# Patient Record
Sex: Male | Born: 1960 | Race: White | Hispanic: No | Marital: Married | Smoking: Current every day smoker
Health system: Southern US, Community
[De-identification: ages and names within clinical notes are randomized; demographics above are authoritative.]

## PROBLEM LIST (undated history)

## (undated) DIAGNOSIS — G4730 Sleep apnea, unspecified: Secondary | ICD-10-CM

## (undated) DIAGNOSIS — D751 Secondary polycythemia: Secondary | ICD-10-CM

## (undated) DIAGNOSIS — I10 Essential (primary) hypertension: Secondary | ICD-10-CM

## (undated) DIAGNOSIS — Z86010 Personal history of colonic polyps: Secondary | ICD-10-CM

## (undated) DIAGNOSIS — E785 Hyperlipidemia, unspecified: Secondary | ICD-10-CM

## (undated) DIAGNOSIS — K219 Gastro-esophageal reflux disease without esophagitis: Secondary | ICD-10-CM

## (undated) HISTORY — DX: Gastro-esophageal reflux disease without esophagitis: K21.9

## (undated) HISTORY — PX: APPENDECTOMY: SHX54

## (undated) HISTORY — DX: Hyperlipidemia, unspecified: E78.5

## (undated) HISTORY — DX: Personal history of colonic polyps: Z86.010

## (undated) HISTORY — DX: Essential (primary) hypertension: I10

## (undated) HISTORY — DX: Sleep apnea, unspecified: G47.30

## (undated) HISTORY — PX: WISDOM TOOTH EXTRACTION: SHX21

## (undated) HISTORY — DX: Secondary polycythemia: D75.1

---

## 1997-05-10 ENCOUNTER — Encounter: Payer: Self-pay | Admitting: Pulmonary Disease

## 2001-06-24 HISTORY — PX: ESOPHAGOGASTRODUODENOSCOPY: SHX1529

## 2004-05-10 ENCOUNTER — Ambulatory Visit: Payer: Self-pay | Admitting: Internal Medicine

## 2004-05-15 ENCOUNTER — Ambulatory Visit: Payer: Self-pay | Admitting: Internal Medicine

## 2004-08-15 ENCOUNTER — Emergency Department (HOSPITAL_COMMUNITY): Admission: EM | Admit: 2004-08-15 | Discharge: 2004-08-16 | Payer: Self-pay | Admitting: Emergency Medicine

## 2005-02-18 ENCOUNTER — Ambulatory Visit: Payer: Self-pay | Admitting: Internal Medicine

## 2005-02-27 ENCOUNTER — Ambulatory Visit: Payer: Self-pay | Admitting: Internal Medicine

## 2005-03-11 ENCOUNTER — Encounter: Admission: RE | Admit: 2005-03-11 | Discharge: 2005-06-09 | Payer: Self-pay | Admitting: Internal Medicine

## 2005-10-04 ENCOUNTER — Observation Stay (HOSPITAL_COMMUNITY): Admission: EM | Admit: 2005-10-04 | Discharge: 2005-10-04 | Payer: Self-pay | Admitting: Emergency Medicine

## 2005-10-04 ENCOUNTER — Ambulatory Visit: Payer: Self-pay | Admitting: *Deleted

## 2005-10-17 ENCOUNTER — Ambulatory Visit: Payer: Self-pay

## 2005-10-21 ENCOUNTER — Ambulatory Visit: Payer: Self-pay | Admitting: Cardiovascular Disease

## 2005-11-13 ENCOUNTER — Ambulatory Visit: Payer: Self-pay | Admitting: Internal Medicine

## 2006-12-03 ENCOUNTER — Telehealth: Payer: Self-pay | Admitting: Internal Medicine

## 2007-01-05 ENCOUNTER — Ambulatory Visit: Payer: Self-pay | Admitting: Internal Medicine

## 2007-01-05 DIAGNOSIS — IMO0002 Reserved for concepts with insufficient information to code with codable children: Secondary | ICD-10-CM | POA: Insufficient documentation

## 2007-01-05 DIAGNOSIS — E1122 Type 2 diabetes mellitus with diabetic chronic kidney disease: Secondary | ICD-10-CM | POA: Insufficient documentation

## 2007-01-05 DIAGNOSIS — I10 Essential (primary) hypertension: Secondary | ICD-10-CM | POA: Insufficient documentation

## 2007-01-05 DIAGNOSIS — E1169 Type 2 diabetes mellitus with other specified complication: Secondary | ICD-10-CM | POA: Insufficient documentation

## 2007-01-05 DIAGNOSIS — E1165 Type 2 diabetes mellitus with hyperglycemia: Secondary | ICD-10-CM | POA: Insufficient documentation

## 2007-01-05 DIAGNOSIS — K219 Gastro-esophageal reflux disease without esophagitis: Secondary | ICD-10-CM | POA: Insufficient documentation

## 2007-01-05 DIAGNOSIS — E785 Hyperlipidemia, unspecified: Secondary | ICD-10-CM

## 2007-01-08 ENCOUNTER — Encounter (INDEPENDENT_AMBULATORY_CARE_PROVIDER_SITE_OTHER): Payer: Self-pay | Admitting: *Deleted

## 2007-01-09 ENCOUNTER — Encounter (INDEPENDENT_AMBULATORY_CARE_PROVIDER_SITE_OTHER): Payer: Self-pay | Admitting: *Deleted

## 2008-04-18 ENCOUNTER — Encounter: Payer: Self-pay | Admitting: Internal Medicine

## 2008-04-19 ENCOUNTER — Telehealth: Payer: Self-pay | Admitting: Internal Medicine

## 2008-05-03 ENCOUNTER — Encounter (INDEPENDENT_AMBULATORY_CARE_PROVIDER_SITE_OTHER): Payer: Self-pay | Admitting: *Deleted

## 2008-05-12 ENCOUNTER — Encounter (INDEPENDENT_AMBULATORY_CARE_PROVIDER_SITE_OTHER): Payer: Self-pay | Admitting: *Deleted

## 2008-05-18 ENCOUNTER — Ambulatory Visit: Payer: Self-pay | Admitting: Internal Medicine

## 2008-05-27 ENCOUNTER — Ambulatory Visit: Payer: Self-pay | Admitting: Internal Medicine

## 2008-05-27 ENCOUNTER — Encounter (INDEPENDENT_AMBULATORY_CARE_PROVIDER_SITE_OTHER): Payer: Self-pay | Admitting: *Deleted

## 2008-05-27 LAB — CONVERTED CEMR LAB
AST: 49 units/L — ABNORMAL HIGH (ref 0–37)
Alkaline Phosphatase: 70 units/L (ref 39–117)
BUN: 15 mg/dL (ref 6–23)
Bilirubin, Direct: 0.1 mg/dL (ref 0.0–0.3)
LDL Cholesterol: 113 mg/dL — ABNORMAL HIGH (ref 0–99)
LDL Goal: 70 mg/dL
Potassium: 5 meq/L (ref 3.5–5.1)
Total CHOL/HDL Ratio: 5.6
Triglycerides: 135 mg/dL (ref 0–149)
VLDL: 27 mg/dL (ref 0–40)

## 2008-05-31 ENCOUNTER — Encounter (INDEPENDENT_AMBULATORY_CARE_PROVIDER_SITE_OTHER): Payer: Self-pay | Admitting: *Deleted

## 2008-06-09 ENCOUNTER — Ambulatory Visit: Payer: Self-pay | Admitting: Pulmonary Disease

## 2008-06-09 ENCOUNTER — Telehealth: Payer: Self-pay | Admitting: Pulmonary Disease

## 2008-06-09 DIAGNOSIS — G4733 Obstructive sleep apnea (adult) (pediatric): Secondary | ICD-10-CM | POA: Insufficient documentation

## 2008-06-13 ENCOUNTER — Telehealth: Payer: Self-pay | Admitting: Pulmonary Disease

## 2008-10-13 ENCOUNTER — Encounter: Payer: Self-pay | Admitting: Internal Medicine

## 2009-01-15 ENCOUNTER — Encounter: Payer: Self-pay | Admitting: Pulmonary Disease

## 2009-02-20 ENCOUNTER — Encounter: Payer: Self-pay | Admitting: Internal Medicine

## 2009-03-02 ENCOUNTER — Encounter: Payer: Self-pay | Admitting: Internal Medicine

## 2009-04-26 ENCOUNTER — Telehealth (INDEPENDENT_AMBULATORY_CARE_PROVIDER_SITE_OTHER): Payer: Self-pay | Admitting: *Deleted

## 2009-04-28 ENCOUNTER — Encounter (INDEPENDENT_AMBULATORY_CARE_PROVIDER_SITE_OTHER): Payer: Self-pay | Admitting: *Deleted

## 2009-05-22 ENCOUNTER — Ambulatory Visit: Payer: Self-pay | Admitting: Internal Medicine

## 2009-05-23 ENCOUNTER — Encounter (INDEPENDENT_AMBULATORY_CARE_PROVIDER_SITE_OTHER): Payer: Self-pay | Admitting: *Deleted

## 2009-05-23 LAB — CONVERTED CEMR LAB
AST: 36 units/L (ref 0–37)
Alkaline Phosphatase: 67 units/L (ref 39–117)
Bilirubin, Direct: 0 mg/dL (ref 0.0–0.3)
Total Bilirubin: 1.1 mg/dL (ref 0.3–1.2)
Total Protein: 7 g/dL (ref 6.0–8.3)

## 2009-05-26 ENCOUNTER — Ambulatory Visit: Payer: Self-pay | Admitting: Internal Medicine

## 2009-05-26 DIAGNOSIS — R7401 Elevation of levels of liver transaminase levels: Secondary | ICD-10-CM | POA: Insufficient documentation

## 2009-05-26 DIAGNOSIS — J45909 Unspecified asthma, uncomplicated: Secondary | ICD-10-CM | POA: Insufficient documentation

## 2009-05-26 DIAGNOSIS — F17200 Nicotine dependence, unspecified, uncomplicated: Secondary | ICD-10-CM | POA: Insufficient documentation

## 2009-05-26 DIAGNOSIS — Z720 Tobacco use: Secondary | ICD-10-CM | POA: Insufficient documentation

## 2009-05-26 DIAGNOSIS — R740 Nonspecific elevation of levels of transaminase and lactic acid dehydrogenase [LDH]: Secondary | ICD-10-CM

## 2009-05-29 ENCOUNTER — Ambulatory Visit: Payer: Self-pay | Admitting: Internal Medicine

## 2009-06-02 ENCOUNTER — Encounter (INDEPENDENT_AMBULATORY_CARE_PROVIDER_SITE_OTHER): Payer: Self-pay | Admitting: *Deleted

## 2009-06-02 ENCOUNTER — Ambulatory Visit: Payer: Self-pay | Admitting: Internal Medicine

## 2009-06-02 LAB — CONVERTED CEMR LAB
BUN: 8 mg/dL (ref 6–23)
Cholesterol: 205 mg/dL — ABNORMAL HIGH (ref 0–200)
Creatinine, Ser: 1.2 mg/dL (ref 0.4–1.5)
HDL: 27.7 mg/dL — ABNORMAL LOW (ref >39.00)
Hgb A1c MFr Bld: 9.6 % — ABNORMAL HIGH (ref 4.6–6.5)
Microalb Creat Ratio: 54.8 mg/g — ABNORMAL HIGH (ref 0.0–30.0)
Microalb, Ur: 12.4 mg/dL — ABNORMAL HIGH (ref 0.0–1.9)
VLDL: 33.4 mg/dL (ref 0.0–40.0)

## 2009-06-12 ENCOUNTER — Encounter: Payer: Self-pay | Admitting: Internal Medicine

## 2010-01-10 ENCOUNTER — Encounter (INDEPENDENT_AMBULATORY_CARE_PROVIDER_SITE_OTHER): Payer: Self-pay | Admitting: *Deleted

## 2010-05-20 IMAGING — CR DG CHEST 2V
2 series · 2 of 2 positions shown · non-contrast
Comparison: Chest 10/03/2005.

CLINICAL DATA: Cough.

CHEST - 2 VIEW

[view not recorded (1 of 2)]
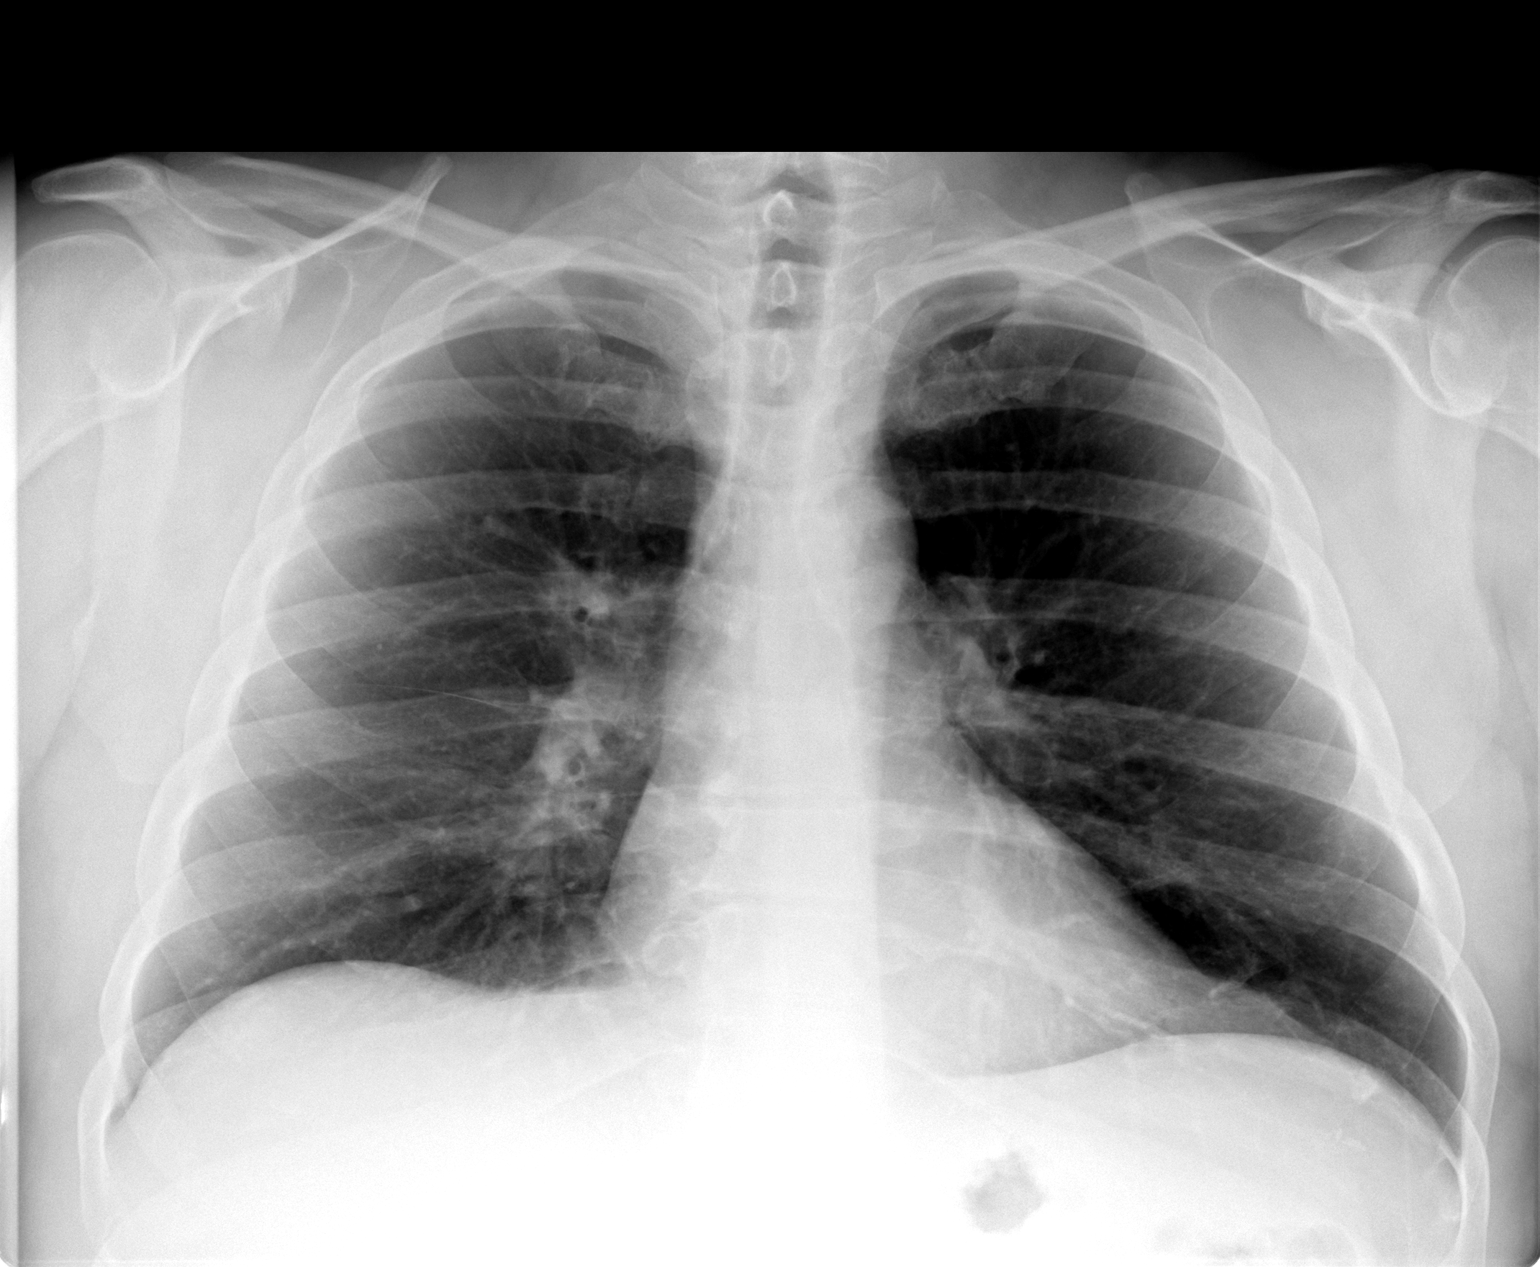

[view not recorded (2 of 2)]
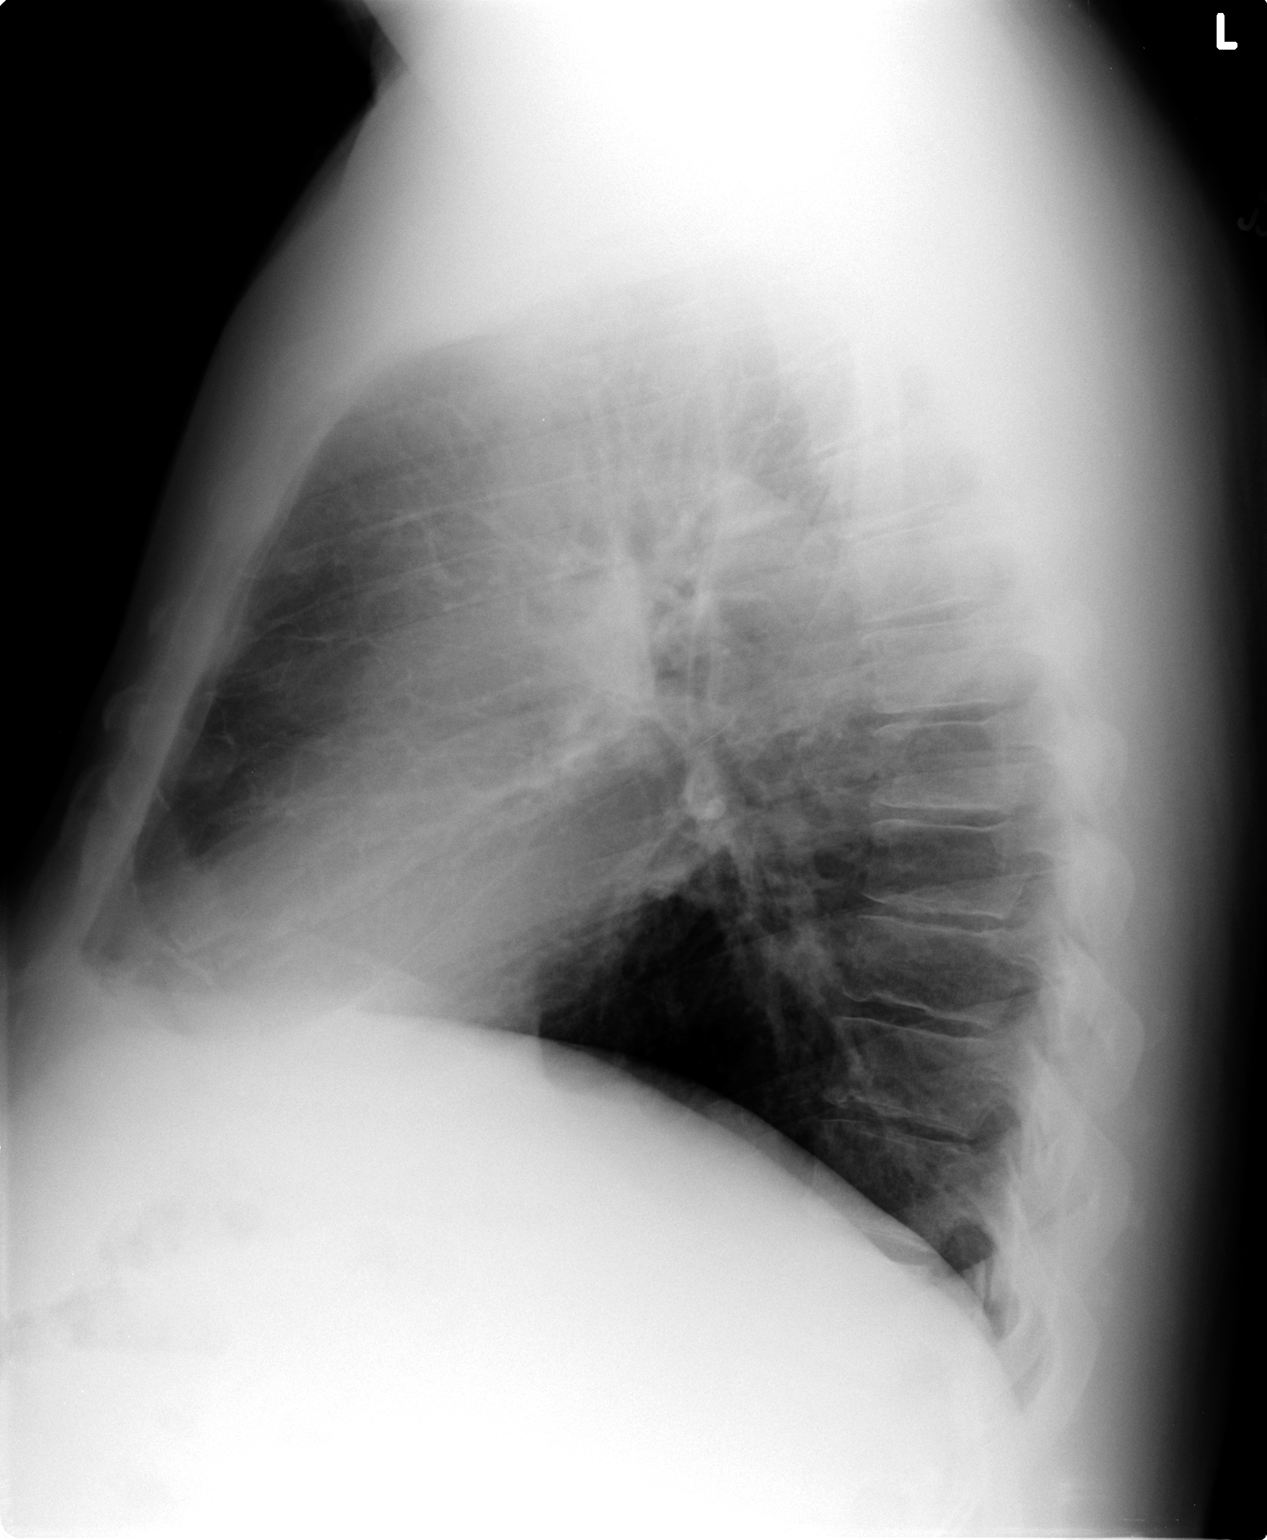

[2 of 2 positions shown; findings below may reference images not displayed]

FINDINGS: The lungs are clear.  Heart size is normal.  There is no
pleural effusion or focal bony abnormality.
IMPRESSION: Negative chest.

## 2010-06-24 DIAGNOSIS — D751 Secondary polycythemia: Secondary | ICD-10-CM

## 2010-06-24 HISTORY — DX: Secondary polycythemia: D75.1

## 2010-07-06 ENCOUNTER — Telehealth (INDEPENDENT_AMBULATORY_CARE_PROVIDER_SITE_OTHER): Payer: Self-pay | Admitting: *Deleted

## 2010-07-22 LAB — CONVERTED CEMR LAB
ALT: 57 units/L — ABNORMAL HIGH (ref 0–53)
AST: 30 units/L (ref 0–37)
Bilirubin, Direct: 0.1 mg/dL (ref 0.0–0.3)
Cholesterol: 211 mg/dL (ref 0–200)
Creatinine, Ser: 1 mg/dL (ref 0.4–1.5)
Creatinine,U: 54.4 mg/dL
Eosinophils Relative: 2.7 % (ref 0.0–5.0)
GFR calc non Af Amer: 86 mL/min
HCT: 46.6 % (ref 39.0–52.0)
LDL Goal: 130 mg/dL
MCHC: 34.7 g/dL (ref 30.0–36.0)
Microalb Creat Ratio: 14.7 mg/g (ref 0.0–30.0)
Monocytes Absolute: 0.8 10*3/uL — ABNORMAL HIGH (ref 0.2–0.7)
Monocytes Relative: 9.9 % (ref 3.0–11.0)
Neutrophils Relative %: 60 % (ref 43.0–77.0)
Platelets: 206 10*3/uL (ref 150–400)
Potassium: 4.5 meq/L (ref 3.5–5.1)
RBC: 5.4 M/uL (ref 4.22–5.81)
RDW: 12.4 % (ref 11.5–14.6)
Sodium: 140 meq/L (ref 135–145)
Total Bilirubin: 0.7 mg/dL (ref 0.3–1.2)
Total Protein: 6.3 g/dL (ref 6.0–8.3)

## 2010-07-24 NOTE — Medication Information (Signed)
Summary: Letter Regarding Adding a Statin/[employer]  Letter Regarding Adding a Statin/[employer]   Imported By: Lanelle Bal 06/29/2009 10:33:17  _____________________________________________________________________  External Attachment:    Type:   Image     Comment:   External Document

## 2010-07-24 NOTE — Letter (Signed)
Summary: Colonoscopy Letter  [facility]  [address] [place], [place] [address]   Phone: [phone]  Fax: [phone]      January 10, 2010 MRN: [id number]   MYRLE DUES [address] [place], [place]  [address]   Dear Mr. Kloss,   According to your medical record, it is time for you to schedule a Colonoscopy. The American Cancer Society recommends this procedure as a method to detect early colon cancer. Patients with a family history of colon cancer, or a personal history of colon polyps or inflammatory bowel disease are at increased risk.  This letter has beeen generated based on the recommendations made at the time of your procedure. If you feel that in your particular situation this may no longer apply, please contact our office.  Please call our office at [phone] to schedule this appointment or to update your records at your earliest convenience.  Thank you for cooperating with [place] to provide you with the very best care possible.   Sincerely,   Iva Boop, M.D.  [facility] [phone]

## 2010-07-26 NOTE — Progress Notes (Signed)
Summary: need orders and diag codes for 2/16--added to lab  Phone Note Call from Patient   Caller: Patient Summary of Call: has CPX for 2/23,    will need orders and diag codes for labs   for 08/18/22                 thanks Initial call taken by: Jerolyn Shin,  July 06, 2010 12:43 PM  Follow-up for Phone Call        Lipid,Hep,BMP,CBCD,TSH,Lipid, PSA, Stool Cards, Udip V70.0/272.4/401.9/995.20/250.02 Follow-up by: Shonna Chock CMA,  July 06, 2010 4:00 PM  Additional Follow-up for Phone Call Additional follow up Details #1::        added to 2022/08/18 labs.Jerolyn Shin  July 06, 2010 4:09 PM

## 2010-08-09 ENCOUNTER — Other Ambulatory Visit: Payer: Self-pay

## 2010-08-09 ENCOUNTER — Other Ambulatory Visit (INDEPENDENT_AMBULATORY_CARE_PROVIDER_SITE_OTHER): Payer: Managed Care, Other (non HMO)

## 2010-08-09 ENCOUNTER — Encounter (INDEPENDENT_AMBULATORY_CARE_PROVIDER_SITE_OTHER): Payer: Self-pay | Admitting: *Deleted

## 2010-08-09 DIAGNOSIS — I10 Essential (primary) hypertension: Secondary | ICD-10-CM

## 2010-08-09 DIAGNOSIS — E1165 Type 2 diabetes mellitus with hyperglycemia: Secondary | ICD-10-CM

## 2010-08-09 DIAGNOSIS — T887XXA Unspecified adverse effect of drug or medicament, initial encounter: Secondary | ICD-10-CM

## 2010-08-09 DIAGNOSIS — R7309 Other abnormal glucose: Secondary | ICD-10-CM

## 2010-08-09 DIAGNOSIS — E785 Hyperlipidemia, unspecified: Secondary | ICD-10-CM

## 2010-08-09 DIAGNOSIS — IMO0001 Reserved for inherently not codable concepts without codable children: Secondary | ICD-10-CM

## 2010-08-09 LAB — CBC WITH DIFFERENTIAL/PLATELET
Basophils Absolute: 0 10*3/uL (ref 0.0–0.1)
HCT: 51.6 % (ref 39.0–52.0)
MCHC: 34.5 g/dL (ref 30.0–36.0)
MCV: 88.4 fl (ref 78.0–100.0)
Monocytes Absolute: 0.6 10*3/uL (ref 0.1–1.0)
Neutrophils Relative %: 57.2 % (ref 43.0–77.0)
Platelets: 221 10*3/uL (ref 150.0–400.0)
RBC: 5.84 Mil/uL — ABNORMAL HIGH (ref 4.22–5.81)
RDW: 12.9 % (ref 11.5–14.6)
WBC: 6.6 10*3/uL (ref 4.5–10.5)

## 2010-08-09 LAB — BASIC METABOLIC PANEL
BUN: 13 mg/dL (ref 6–23)
GFR: 93.53 mL/min (ref >60.00)
Potassium: 5.3 mEq/L — ABNORMAL HIGH (ref 3.5–5.1)

## 2010-08-09 LAB — HEPATIC FUNCTION PANEL
ALT: 33 U/L (ref 0–53)
AST: 24 U/L (ref 0–37)
Alkaline Phosphatase: 81 U/L (ref 39–117)
Bilirubin, Direct: 0.1 mg/dL (ref 0.0–0.3)
Total Bilirubin: 0.7 mg/dL (ref 0.3–1.2)
Total Protein: 7.3 g/dL (ref 6.0–8.3)

## 2010-08-09 LAB — PSA: PSA: 0.85 ng/mL (ref 0.10–4.00)

## 2010-08-09 LAB — TSH: TSH: 1.5 u[IU]/mL (ref 0.35–5.50)

## 2010-08-09 LAB — LIPID PANEL: HDL: 30.4 mg/dL — ABNORMAL LOW (ref >39.00)

## 2010-08-10 ENCOUNTER — Encounter: Payer: Self-pay | Admitting: Internal Medicine

## 2010-08-10 ENCOUNTER — Ambulatory Visit (INDEPENDENT_AMBULATORY_CARE_PROVIDER_SITE_OTHER): Payer: Managed Care, Other (non HMO) | Admitting: Internal Medicine

## 2010-08-10 DIAGNOSIS — IMO0001 Reserved for inherently not codable concepts without codable children: Secondary | ICD-10-CM

## 2010-08-10 DIAGNOSIS — E785 Hyperlipidemia, unspecified: Secondary | ICD-10-CM

## 2010-08-10 DIAGNOSIS — I10 Essential (primary) hypertension: Secondary | ICD-10-CM

## 2010-08-10 DIAGNOSIS — F17200 Nicotine dependence, unspecified, uncomplicated: Secondary | ICD-10-CM

## 2010-08-10 LAB — CONVERTED CEMR LAB
Blood in Urine, dipstick: NEGATIVE
Glucose, Urine, Semiquant: >=1000
Protein, U semiquant: NEGATIVE
Urobilinogen, UA: 0.2
pH: 5

## 2010-08-11 ENCOUNTER — Encounter: Payer: Self-pay | Admitting: Internal Medicine

## 2010-08-15 NOTE — Assessment & Plan Note (Signed)
Summary: review lab per md/cbs   Vital Signs:  Patient profile:   50 year old male Height:      66.25 inches (168.28 cm) Weight:      223.25 pounds (101.48 kg) BMI:     35.89 Temp:     98.3 degrees F (36.83 degrees C) oral Resp:     14 per minute BP sitting:   120 / 76  (left arm) Cuff size:   large  Vitals Entered By: Lucious Groves CMA (August 10, 2010 12:29 PM) CC: Review labs per Md./kb, Type 2 diabetes mellitus follow-up Is Patient Diabetic? Yes Pain Assessment Patient in pain? no      Comments Patient notes that he is not on Lisinopril and not on Glimepiride. We are not sure if this is correct, so it was not removed from patient med list./kb   Primary Care Provider:  Marga Melnick MD  CC:  Review labs per Md./kb and Type 2 diabetes mellitus follow-up.  History of Present Illness:    Zachary Clay 's lab results & risks were reviewed . FBS 257 ( A1c pending); he has not been on Glymiperide for 4-5 months." The drug store said I had never taken it; but they had been filling it". He  reports weight loss of 22 # with decreased portions, but denies polyuria, polydipsia, blurred vision, self managed hypoglycemia, and numbness of extremities.  The patient denies the following symptoms: neuropathic pain, chest pain, vomiting, orthostatic symptoms, poor wound healing, intermittent claudication, vision loss, and foot ulcer.  Since the last visit the patient reports poor dietary compliance @ times, not exercising regularly, and not monitoring blood glucose ( "I ran out of strips in August").  Since the last visit, the patient reports having had eye care by an ophthalmologist( no retinopathy) and no foot care.  A1c was 9.6 % in 05/2009 which is average sugar of 229 & > 90% increased risk of heart attack or stroke (discussed).    Hyperlipidemia Follow-Up:He  denies muscle aches, GI upset, abdominal pain, flushing, itching, constipation, diarrhea, and fatigue.  Other symptoms include dypsnea  "because I smoke"(1& 1/2 ppd).  The patient denies the following symptoms: palpitations, syncope, and pedal edema.  Compliance with medications (by patient report) has been near 100%.      Hypertension Follow-Up: BP not checked; he has been off ACE-I . Rx was written 03/06/2009 for 90 pills with 3 refills.  The patient denies headaches & epistaxis.  Adjunctive measures currently used by the patient include salt restriction.    Current Medications (verified): 1)  Glimepiride 4 Mg Tabs (Glimepiride) .... 1/2 Two Times A Day**appointment Due** 2)  Nexium 40 Mg  Cpdr (Esomeprazole Magnesium) .... Once Daily 3)  Lisinopril 20 Mg Tabs (Lisinopril) .Marland Kitchen.. 1 By Mouth Once Daily , Appointment Will Be Due When Refills Run Out 4)  Januvia 100 Mg  Tabs (Sitagliptin Phosphate) .Marland Kitchen.. 1 By Mouth Once Daily **appointment Due 05/2010** 5)  Metformin Hcl 1000 Mg  Tabs (Metformin Hcl) .Marland Kitchen.. 1 Twice Daily **appointment Due 05/2010** 6)  Lipitor 20 Mg  Tabs (Atorvastatin Calcium) .Marland Kitchen.. 1 By Mouth Once Daily **appointment Due 05/2010** 7)  Cpap 8)  Precision Xtra Blood Glucose   Strp (Glucose Blood) .... Use Daily As Directed  Allergies (verified): 1)  Actos  Past History:  Past Medical History: ERD Early repolarization changes on EKG 04/2002 DM: HgbA1c  8.5 in 04/2002 HYPERLIPIDEMIA NEC/NOS (ICD-272.4) HYPERTENSION, ESSENTIAL NOS (ICD-401.9) SLEEP APNEA (ICD-780.57), CPAP  Colonic polyps,  PMH  of  Past Surgical History: Salivary stone  06/2001 Upper endoscopy 05/2002; angiodysplasias, Barrett's ? Colonoscopy: polyps-2003 (Letter sent 12/2009 recommending repeat )  Family History: Father:MI  @ 49  Mother: DM Siblings: sister: melanoma ;MGM & MGF: DM  Social History: Current Smoker:1.5  ppd Alcohol use-yes:  beer occasinally on weekend Occupation: Curator business  Review of Systems GI:  Denies bloody stools, dark tarry stools, and indigestion. GU:  Denies discharge, dysuria, and hematuria;  Occasional  R inguinal burning with urination.  Physical Exam  General:  in no acute distress; alert,appropriate and cooperative throughout examination Neck:  No deformities, masses, or tenderness noted. Lungs:  Normal respiratory effort, chest expands symmetrically. Lungs are clear to auscultation, no crackles or wheezes. Heart:  Normal rate and regular rhythm. S1 and S2 normal without gallop, murmur, click, rub or other extra sounds. Abdomen:  Bowel sounds positive,abdomen soft and non-tender without masses, organomegaly or hernias noted. Pulses:  R and L carotid,radial,dorsalis pedis and posterior tibial pulses are full and equal bilaterally Extremities:  No clubbing, cyanosis, edema. Mild fungal toenail changes Neurologic:   over feet sensation intact to light touch and DTRs symmetrical and normal.   Skin:  Intact without suspicious lesions or rashes. Hands dry & cracked Cervical Nodes:  No lymphadenopathy noted Axillary Nodes:  No palpable lymphadenopathy Psych:  memory intact for recent and remote, normally interactive, and good eye contact.     Impression & Recommendations:  Problem # 1:  DM, UNCOMPLICATED, TYPE II, UNCONTROLLED (ICD-250.02)  A1c pending The following medications were removed from the medication list:    Januvia 100 Mg Tabs (Sitagliptin phosphate) .Marland Kitchen... 1 by mouth once daily **appointment due 05/2010**    Metformin Hcl 1000 Mg Tabs (Metformin hcl) .Marland Kitchen... 1 twice daily **appointment due 05/2010** His updated medication list for this problem includes:    Glimepiride 4 Mg Tabs (Glimepiride) .Marland Kitchen... 1/2 two times a day    Lisinopril 20 Mg Tabs (Lisinopril) .Marland Kitchen... 1/2 once daily  Orders: EKG w/ Interpretation ([address])  Problem # 2:  HYPERLIPIDEMIA NEC/NOS (ICD-272.4) TG > 500 ; high risk of Pancreatitis discussed  Problem # 3:  SMOKER (ICD-305.1) 2-3 X increased MI/CVA risk discussed  Problem # 4:  COLONIC POLYPS, HX OF (ICD-V12.72) Colonoscopy overdue; letter  from GI reviewed ( "I was OOT for several months")  Problem # 5:  HYPERTENSION, ESSENTIAL NOS (ICD-401.9)  BP controlled but ACE-I needed for kidney protection as per [facility] His updated medication list for this problem includes:    Lisinopril 20 Mg Tabs (Lisinopril) .Marland Kitchen... 1/2 once daily (Note : never returned )  Orders: EKG w/ Interpretation ([address])  Complete Medication List: 1)  Glimepiride 4 Mg Tabs (Glimepiride) .... 1/2 two times a day 2)  Nexium 40 Mg Cpdr (Esomeprazole magnesium) .... Once daily 3)  Lisinopril 20 Mg Tabs (Lisinopril) .... 1/2 once daily 4)  Lipitor 20 Mg Tabs (atorvastatin Calcium)  .Marland Kitchen.. 1 by mouth once daily 5)  Cpap  6)  Precision Xtra Blood Glucose Strp (Glucose blood) .... Use daily as directed 7)  Janumet 50-1000 Mg Tabs (sitagliptin-metformin Hcl)  8)  Freestyle Lite Test Strp (Glucose blood) .... Check bloodsugar once daily 9)  Freestyle Lancets Misc (Lancets) .... Check bloodsugar daily  Patient Instructions: 1)  Follow The New Sugar Bustwers ; avoid sugar from High Fructose Corn Syrup in foods  & drinks  as #2, 3 , or  # 4 on label. 2)  Please schedule a follow-up appointment in  3  months. 3)  BUN,creat, K+ prior to visit, ICD-9:401.9 4)  Lipid Panel prior to visit, ICD-9:272.4 5)  HbgA1C prior to visit, ICD-9:250.02 6)  Urine Microalbumin prior to visit, ICD-9:250.02 7)  Schedule a colonoscopy  to help detect colon cancer as recommended by GI. 8)  Take an 81 mg coated  Aspirin every day. Prescriptions: FREESTYLE LANCETS  MISC (LANCETS) check bloodsugar daily  #100 x 3   Entered by:   Shonna Chock CMA   Authorized by:   Marga Melnick MD   Signed by:   Shonna Chock CMA on 08/10/2010   Method used:   Print then Give to Patient   RxID:   [id number] FREESTYLE LITE TEST  STRP (GLUCOSE BLOOD) check bloodsugar once daily  #100 x 3   Entered by:   Shonna Chock CMA   Authorized by:   Marga Melnick MD   Signed by:   Shonna Chock CMA on 08/10/2010    Method used:   Print then Give to Patient   RxID:   [id number] GLIMEPIRIDE 4 MG TABS (GLIMEPIRIDE) 1/2 two times a day  #90 x 0   Entered and Authorized by:   Marga Melnick MD   Signed by:   Marga Melnick MD on 08/10/2010   Method used:   Print then Give to Patient   RxID:   [id number] JANUMET 50-1000 MG TABS (SITAGLIPTIN-METFORMIN HCL)   #180 x 0   Entered and Authorized by:   Marga Melnick MD   Signed by:   Marga Melnick MD on 08/10/2010   Method used:   Print then Give to Patient   RxID:   [id number] LIPITOR 20 MG  TABS (ATORVASTATIN CALCIUM) 1 by mouth once daily  #90 x 0   Entered and Authorized by:   Marga Melnick MD   Signed by:   Marga Melnick MD on 08/10/2010   Method used:   Print then Give to Patient   RxID:   [id number] GLIMEPIRIDE 4 MG TABS (GLIMEPIRIDE) 1/2 two times a day  #90 x 0   Entered and Authorized by:   Marga Melnick MD   Signed by:   Marga Melnick MD on 08/10/2010   Method used:   Print then Give to Patient   RxID:   [id number] LISINOPRIL 20 MG TABS (LISINOPRIL) 1/2 once daily  #90 x 0   Entered and Authorized by:   Marga Melnick MD   Signed by:   Marga Melnick MD on 08/10/2010   Method used:   Print then Give to Patient   RxID:   [id number] GLIMEPIRIDE 4 MG TABS (GLIMEPIRIDE) 1/2 two times a day  #90 x 0   Entered and Authorized by:   Marga Melnick MD   Signed by:   Marga Melnick MD on 08/10/2010   Method used:   Electronically to        [facility]  [address] [phone]* (retail)       [address]       [place], [place]  [address]       Ph: [phone] or [phone]       Fax: [phone]   RxID:   [id number]    Orders Added: 1)  Est. Patient Level IV [[address]] 2)  EKG w/ Interpretation [[address]]    Laboratory Results   Urine Tests   Date/Time Reported: August 10, 2010 1:24 PM  Routine Urinalysis   Color: yellow Appearance: Clear Glucose: >=1000   (  Normal Range:  Negative) Bilirubin: negative   (Normal Range: Negative) Ketone: small (15)   (Normal Range: Negative) Spec. Gravity: <1.005   (Normal Range: 1.003-1.035) Blood: negative   (Normal Range: Negative) pH: 5.0   (Normal Range: 5.0-8.0) Protein: negative   (Normal Range: Negative) Urobilinogen: 0.2   (Normal Range: 0-1) Nitrite: negative   (Normal Range: Negative) Leukocyte Esterace: negative   (Normal Range: Negative)

## 2010-08-16 ENCOUNTER — Encounter: Payer: Self-pay | Admitting: Internal Medicine

## 2010-08-16 ENCOUNTER — Ambulatory Visit: Payer: Managed Care, Other (non HMO) | Admitting: Internal Medicine

## 2010-08-29 ENCOUNTER — Telehealth (INDEPENDENT_AMBULATORY_CARE_PROVIDER_SITE_OTHER): Payer: Self-pay | Admitting: *Deleted

## 2010-09-04 NOTE — Progress Notes (Signed)
Summary: differn test strip  Phone Note Refill Request   Refills Requested: Medication #1:  FREESTYLE LITE TEST  STRP check bloodsugar once daily  Medication #2:  FREESTYLE LANCETS  MISC check bloodsugar daily. [facility] [address] [facility] [place] - fax [phone] -  note from pharmacy - ins requires one touch or accu chek  Initial call taken by: Okey Regal [place],  August 29, 2010 2:24 PM  Follow-up for Phone Call        I spoke with patient and informed him per note from pharmacy insurance requesting change on DM machine. Patient aware I will send in new rx for supplies and he can stopp by to pick up new machine  Follow-up by: Shonna Chock CMA,  August 30, 2010 8:49 AM    New/Updated Medications: ONETOUCH ULTRA BLUE  STRP (GLUCOSE BLOOD) check bloodsugar once daily ONETOUCH DELICA LANCETS  MISC (LANCETS) check bloodsugar once daily Prescriptions: ONETOUCH ULTRA BLUE  STRP (GLUCOSE BLOOD) check bloodsugar once daily  #100 x 3   Entered by:   Shonna Chock CMA   Authorized by:   Marga Melnick MD   Signed by:   Shonna Chock CMA on 08/30/2010   Method used:   [facility]        [facility]  [address] [phone]* (retail)       [address]       [place], [place]  [address]       Ph: [phone] or [phone]       Fax: [phone]   RxID:   [id number] [address] LANCETS  MISC (LANCETS) check bloodsugar once daily  #100 x 3   Entered by:   Shonna Chock CMA   Authorized by:   Marga Melnick MD   Signed by:   Shonna Chock CMA on 08/30/2010   Method used:   [facility]        [facility]  [address] [phone]* (retail)       [address]       [place], [place]  [address]       Ph: [phone] or [phone]       Fax: [phone]   RxID:   [id number]

## 2010-09-16 ENCOUNTER — Other Ambulatory Visit: Payer: Self-pay | Admitting: Internal Medicine

## 2010-10-31 ENCOUNTER — Other Ambulatory Visit: Payer: Self-pay | Admitting: *Deleted

## 2010-10-31 DIAGNOSIS — IMO0001 Reserved for inherently not codable concepts without codable children: Secondary | ICD-10-CM

## 2010-10-31 DIAGNOSIS — I10 Essential (primary) hypertension: Secondary | ICD-10-CM

## 2010-10-31 DIAGNOSIS — E785 Hyperlipidemia, unspecified: Secondary | ICD-10-CM

## 2010-11-01 ENCOUNTER — Other Ambulatory Visit (INDEPENDENT_AMBULATORY_CARE_PROVIDER_SITE_OTHER): Payer: Managed Care, Other (non HMO)

## 2010-11-01 DIAGNOSIS — I10 Essential (primary) hypertension: Secondary | ICD-10-CM

## 2010-11-01 DIAGNOSIS — IMO0001 Reserved for inherently not codable concepts without codable children: Secondary | ICD-10-CM

## 2010-11-01 DIAGNOSIS — E785 Hyperlipidemia, unspecified: Secondary | ICD-10-CM

## 2010-11-01 LAB — POTASSIUM: Potassium: 4.9 mEq/L (ref 3.5–5.1)

## 2010-11-01 LAB — LIPID PANEL
LDL Cholesterol: 67 mg/dL (ref 0–99)
Total CHOL/HDL Ratio: 4
Triglycerides: 151 mg/dL — ABNORMAL HIGH (ref 0.0–149.0)

## 2010-11-01 LAB — MICROALBUMIN / CREATININE URINE RATIO: Creatinine,U: 163 mg/dL

## 2010-11-01 LAB — HEMOGLOBIN A1C: Hgb A1c MFr Bld: 8.4 % — ABNORMAL HIGH (ref 4.6–6.5)

## 2010-11-03 ENCOUNTER — Encounter: Payer: Self-pay | Admitting: Internal Medicine

## 2010-11-08 ENCOUNTER — Encounter: Payer: Self-pay | Admitting: Internal Medicine

## 2010-11-08 ENCOUNTER — Ambulatory Visit (INDEPENDENT_AMBULATORY_CARE_PROVIDER_SITE_OTHER): Payer: Managed Care, Other (non HMO) | Admitting: Internal Medicine

## 2010-11-08 DIAGNOSIS — E785 Hyperlipidemia, unspecified: Secondary | ICD-10-CM

## 2010-11-08 DIAGNOSIS — E162 Hypoglycemia, unspecified: Secondary | ICD-10-CM

## 2010-11-08 DIAGNOSIS — IMO0001 Reserved for inherently not codable concepts without codable children: Secondary | ICD-10-CM

## 2010-11-08 DIAGNOSIS — F17200 Nicotine dependence, unspecified, uncomplicated: Secondary | ICD-10-CM

## 2010-11-08 NOTE — Patient Instructions (Signed)
Check A1c & urine microalbumin in 3 months ( 250.02) Exercise at least 30-45 minutes a day,  3-4 days a week.  Eat a low-fat diet with lots of fruits and vegetables, up to 7-9 servings per day. Avoid obesity; your goal is waist measurement < 40 inches.Consume less than 40 grams of sugar per day from foods & drinks with High Fructose Corn Sugar as #2,3 or # 4 on label.  Please think about quitting smoking. Review the risks we discussed. Please call 1-800-QUIT-NOW [phone]) for free smoking cessation counseling.

## 2010-11-08 NOTE — Progress Notes (Signed)
  Subjective:    Patient ID: Zachary Clay, male    DOB: June 18, 1961, 50 y.o.   MRN: [id number]  HPI Diabetes monitor Fasting blood sugar range:120-135 Fasting blood sugar average: usually 135 Highest glucose 2 hours postprandially: 275 Hypoglycemia: as low as 70 @ 5 pm , he eats lunch @ 12 noon. He takes Glimiperide @ 6-6:30 am & @ 8-9 pm Polyuria/polydipsia/polyphagia:no Postural symptoms (lightheadedness):no Chest pain/palpitations/claudication:no Skin lesions/ulcers:no Numbness, tingling, burning extremities:no Weight change:stable Vision change ( blurring, diplopia, vision loss):no Exercise:walking occasionally Nutrition/diet:decreased portions; he skips breakfast Medication compliance:yes Adverse medicine effects: see Hypoglycemia above Eye exam:01/2010 Footcare:no A1 C./urine microalbumin: His A1c has dropped from 11.1% to 8.4%. This represents a drop in average  sugar from 272-194. Additionally his long-term cardiovascular risk has dropped from 122% to 68%.  Also  Dramatic is the  improvement in his lipids as well. His triglycerides have dropped from 536 251. LDL is ideal at 67.       Review of Systems     Objective:   Physical Exam Gen.: Healthy and well-nourished in appearance. Alert, appropriate and cooperative throughout exam. Eyes: No corneal or conjunctival inflammation noted.Mouth: Oral mucosa and oropharynx reveal no lesions or exudates. Teeth in good repair. Neck: No deformities, masses, or tenderness noted.  Thyroid  normal. Lungs: Normal respiratory effort; chest expands symmetrically. Lungs are clear to auscultation without rales, wheezes, or increased work of breathing. Heart: Normal rate and rhythm. Normal S1 and S2. No gallop, click, or rub. S4 with slurring; no  murmur. Abdomen: Bowel sounds normal; abdomen soft and nontender. No masses, organomegaly or hernias noted. Neurologic: Alert and oriented x3. Light touch normal over feet.         Skin: Intact  without suspicious lesions or rashes. Scar tissue over hands. Nails healthy. Psych: Mood and affect are normal. Normally interactive                                                                                         Assessment & Plan:  #1 diabetes, uncontrolled but dramatically improved in reference to prior A1c of 11.1. Goals and risks discussed  #2 intermittent hypoglycemia; this is related to his skipping breakfast and also taking the  Sulfonylurea agent  at night after a meal.  #3 dyslipidemia, dramatic improvement in the triglycerides. His LDL is at goal.  Plan: He should eat  breakfast on regular basis to prevent swings in  sugars.  Glimiperide  should be taken a half a pill twice a day with the 2 largest meals. I  recommend low carb diet ( The New Sugar Busters)  The A1c and urine microalbumin will be repeated in 3 months. If the A1c is not 7% on less, consideration will be given to starting  Victoza injections.

## 2010-11-09 NOTE — H&P (Signed)
NAMEHAYNES, GIANNOTTI NO.:  [id number]   MEDICAL RECORD NO.:  [id number]          PATIENT TYPE:  EMS   LOCATION:  [facility]                         FACILITY:  [facility]   PHYSICIAN:  Audery Amel, MD     DATE OF BIRTH:  1961/02/16   DATE OF ADMISSION:  10/03/2005  DATE OF DISCHARGE:                                HISTORY & PHYSICAL   PRIMARY CARE PHYSICIAN:  Dr. Alwyn Ren.   CHIEF COMPLAINT:  Chest pressure.   HISTORY OF PRESENT ILLNESS:  This patient is a 50 year old white male with  past medical history notable for diabetes type 2, hypertension,  hyperlipidemia who presents with a chief complaint of chest pressure.  The  patient stated that the pain started at rest earlier this evening after  coming home from work.  He denied any radiation or associated nausea,  vomiting, diaphoresis.  He denies shortness of breath and dyspnea on  exertion; however, he does state that the patient is worse with deep  inspiration.  There was no palpable component to his complaint.  The patient  states he has not had any previous episode similar to this.  He does have a  history of GERD; however, this was different.  Currently, the patient is  chest painfree.  His EKG reveals normal sinus rhythm, no evidence of acute  injury or ischemia.  His initial biomarkers are negative.   PAST MEDICAL HISTORY:  1.  Diabetes type 2.  2.  Hyperlipidemia.  3.  Hypertension.   ALLERGIES:  No known drug allergies.   MEDICATIONS:  1.  Lisinopril 20 mg daily.  2.  Metformin 1 gm b.i.d.  3.  Amaryl 2 mg b.i.d.  4.  Ultram ER 100 mg b.i.d.  5.  Flexeril 10 mg t.i.d.  6.  Elavil 10 mg q.h.s.  7.  Nexium 40 mg daily.   SOCIAL HISTORY:  The patient lives in [place] with his wife and operates  heavy machinery for a living.  The patient has a significant smoking history  of 40-pack years.  Endorses occasional alcohol use and no illicit  substances.   FAMILY HISTORY:  His father suffered a myocardial  infarction at the age of  74 and is status post CABG.  His mom is alive and well.  His siblings are  alive and well.   REVIEW OF SYSTEMS:  CONSTITUTIONAL:  No fevers, chills, sweats, adenopathy.  HEENT:  No headache, sore throat, nasal discharge, or change in vision or  hearing.  SKIN:  No rashes or lesions.  CARDIOPULMONARY:  Per HPI.  GU:  No  frequency, urgency, dysuria.  NEUROPSYCHIATRIC:  No weakness, numbness or  mood disturbance.  MUSCULOSKELETAL:  The patient has chronic back pain.  No  joint swelling and no deformity.  GI:  No nausea, vomiting, diarrhea, bright  red blood per rectum, melena, or dysphagia.  The patient does have a history  of GERD and it is treated with PPI.  ENDOCRINE:  No polyuria, polydipsia,  heat or cold intolerance.  All other review of systems is negative.   PHYSICAL  EXAMINATION:  VITAL SIGNS:  Temperature afebrile, blood pressure  126/80, heart rate is 87, O2 saturation are 96% on room air.  GENERAL:  The patient is alert and oriented x3.  No acute distress.  Pleasantly conversant.  HEENT:  Normocephalic and atraumatic.  EOMI.  PERRL.  Nares patent.  O&P is  clear.  NECK:  Supple.  Full range of motion.  No JVD, lymphadenopathy none.  CARDIOVASCULAR:  S1 and S2 without murmurs, gallops, or rubs.  2+ symmetric  pulses bilaterally without bruit.  No palpable chest tenderness.  LUNGS:  Reveals coarse breath sounds over the right posterior lung field.  Otherwise clear to auscultation.  SKIN:  No rashes or lesions.  ABDOMEN:  Soft, nontender, and nondistended.  Positive bowel sounds.  No  hepatosplenomegaly.  EXTREMITIES:  No clubbing, cyanosis, or edema.  No rashes, lesions,  petechiae.  MUSCULOSKELETAL:  No joint deformity.  NEUROLOGICAL:  Cranial nerves II through XII grossly intact.  5/5 strength  bilateral extremities and symmetric.  Sensation is grossly normal  throughout.   Chest x-ray is pending.  EKG normal sinus rhythm with no evidence of  acute  injury or ischemia.   LABORATORY DATA:  White blood cell count 9.8, hematocrit is 46, platelet  count 206,000.  Sodium 133, potassium 4.2, chloride 100, BUN 11, glucose  151, creatinine is 1.0.  Initial troponin less than 0.05.  CK-MB is 2.7.   ASSESSMENT:  The patient is a 50 year old white male with multiple risk  factors for coronary artery disease and an early family history presenting  with chest pressure.   PLAN:  We will admit the patient for 23-hour observation to rule out  myocardial infarction.  The patient did have a normal stress test performed  approximately two years ago.  His symptoms are somewhat atypical in that  they are made worse with deep inspiration.  His EKG is normal.  There is no  evidence of injury or ischemia and his biomarkers are negative.  Despite his  multiple risk factors and early family history, given the nature of his  symptoms and normal EKG, would favor noninvasive evaluation versus cardiac  catheterization.  Would check a complete metabolic profile in the a.m.,  fasting lipid panel.  The patient is diabetic and has a compelling  indication for Statin.  We will initiate Atorvastatin 40 mg p.o. daily.  Continue aspirin, Lisinopril 20 mg daily.  We will cover his diabetes with  sliding scale insulin.           ______________________________  Audery Amel, MD     SHG/MEDQ  D:  10/04/2005  T:  10/04/2005  Job:  [id number]

## 2010-11-09 NOTE — Discharge Summary (Signed)
NAMESHAMOND, SKELTON                ACCOUNT NO.:  [id number]   MEDICAL RECORD NO.:  [id number]          PATIENT TYPE:  INP   LOCATION:  4711                         FACILITY:  [facility]   PHYSICIAN:  Charlton Haws, M.D.     DATE OF BIRTH:  17-Jun-1961   DATE OF ADMISSION:  10/03/2005  DATE OF DISCHARGE:  10/04/2005                           DISCHARGE SUMMARY - REFERRING   DISCHARGE DIAGNOSES:  1.  Chest pain.  Patient ruled out for myocardial infarction, pending      outpatient Myoview per insurance approval.  2.  Hyperglycemia/diabetes type 2.  3.  Hyperlipidemia.  4.  Hypertension.   PAST MEDICAL HISTORY:  1.  Diabetes type 2.  2.  Hyperlipidemia.  3.  Hypertension.  4.  Significant smoking history of 40-pack-year.   HOSPITAL COURSE:  Mr. Croft is a 50 year old Caucasian gentleman with  notable history as stated above who presented to [place] Emergency Room with a chief complaint of chest pain.  EKG shows  normal sinus rhythm without evidence of acute ischemia or injury.  His  initial biomarkers were negative. Patient has multiple risk factors for  coronary artery disease including father who had an MI at age 9 and is  status post coronary artery bypass graft.  Mr. Lurz was admitted for 23-  hour observation.  He ruled out for a myocardial infarction.  EKG was  normal.  Symptoms were somewhat atypical in that they were made worse with  deep inspiration.  However, given patient's past medical history/multiple  risk factors and early family history, patient would benefit from  noninvasive evaluation for ischemia.  Dr. Eden Emms in to see patient on day of  discharge.  Patient without complaints of chest discomfort at this time.   Prior to discharge, patient afebrile, pulse 76, respirations 19, blood  pressure 112/73, patient sating 95% on room air.   LABORATORY DATA:  Troponin of 0.02.  Urinalysis negative.  CBC with white  blood cell count of 9.8,  hemoglobin 16.4,  hematocrit 46.8, platelet count  206,000.  Creatinine 1, BUN 11, glucose 151.   Chest x-ray this admission shows bibasilar atelectasis.   DISPOSITION:  Patient being discharged home.  He has a follow-up appointment  with Dr. Eden Emms for October 21, 2005, at 9:30.  He has a tentative appointment  for stress Myoview on October 17, 2005, at 9:30 pending insurance approval.  He needs to have fasting liver and lipids checked in four to six weeks.  This can be done through patient's primary care physician, Dr. Alwyn Ren.  Patient agrees to call Dr. Alwyn Ren to schedule an appointment for post  hospitalization visit.   At time of discharge, patient has been instructed to continue his previous  medications including  1.  Lisinopril 20 mg daily.  2.  Metformin 1 g b.i.d.  3.  Amaryl 2 mg b.i.d.  4.  Ultram ER 100 mg b.i.d.  5.  Flexeril 10 mg t.i.d. or as previously instructed.  6.  Elavil 10 mg at bedtime.  7.  Nexium 40 mg daily.  8.  Patient has also been placed on 81 mg of enteric coated aspirin.  9.  Lipitor 40 mg.   DURATION OF DISCHARGE ENCOUNTER:  25 minutes.      Dorian Pod, NP    ______________________________  Charlton Haws, M.D.    MB/MEDQ  D:  10/04/2005  T:  10/04/2005  Job:  [id number]   cc:   Titus Dubin. Alwyn Ren, M.D. [facility]  [phone] [place]  [place] [address]

## 2010-12-17 ENCOUNTER — Other Ambulatory Visit: Payer: Self-pay | Admitting: Internal Medicine

## 2010-12-17 MED ORDER — SITAGLIPTIN PHOS-METFORMIN HCL 50-1000 MG PO TABS: 1.0000 | ORAL_TABLET | Freq: Two times a day (BID) | ORAL | Status: DC

## 2010-12-17 MED ORDER — GLIMEPIRIDE 4 MG PO TABS: 4.0000 mg | ORAL_TABLET | Freq: Every day | ORAL | Status: DC

## 2010-12-17 NOTE — Telephone Encounter (Signed)
Sent in under Dr.Hoppers name.

## 2011-01-14 ENCOUNTER — Other Ambulatory Visit: Payer: Self-pay | Admitting: Internal Medicine

## 2011-01-31 ENCOUNTER — Other Ambulatory Visit: Payer: Self-pay | Admitting: Internal Medicine

## 2011-01-31 DIAGNOSIS — IMO0001 Reserved for inherently not codable concepts without codable children: Secondary | ICD-10-CM

## 2011-02-01 ENCOUNTER — Other Ambulatory Visit (INDEPENDENT_AMBULATORY_CARE_PROVIDER_SITE_OTHER): Payer: Managed Care, Other (non HMO)

## 2011-02-01 DIAGNOSIS — IMO0001 Reserved for inherently not codable concepts without codable children: Secondary | ICD-10-CM

## 2011-02-01 LAB — MICROALBUMIN / CREATININE URINE RATIO
Creatinine,U: 128.6 mg/dL
Microalb Creat Ratio: 5.6 mg/g (ref 0.0–30.0)

## 2011-02-01 NOTE — Progress Notes (Signed)
Labs only

## 2011-02-08 ENCOUNTER — Telehealth: Payer: Self-pay | Admitting: Internal Medicine

## 2011-02-08 ENCOUNTER — Ambulatory Visit (INDEPENDENT_AMBULATORY_CARE_PROVIDER_SITE_OTHER): Payer: Managed Care, Other (non HMO) | Admitting: Internal Medicine

## 2011-02-08 ENCOUNTER — Encounter: Payer: Self-pay | Admitting: Internal Medicine

## 2011-02-08 DIAGNOSIS — I10 Essential (primary) hypertension: Secondary | ICD-10-CM

## 2011-02-08 DIAGNOSIS — IMO0001 Reserved for inherently not codable concepts without codable children: Secondary | ICD-10-CM

## 2011-02-08 DIAGNOSIS — F17200 Nicotine dependence, unspecified, uncomplicated: Secondary | ICD-10-CM

## 2011-02-08 MED ORDER — ESOMEPRAZOLE MAGNESIUM 40 MG PO CPDR: 40.0000 mg | DELAYED_RELEASE_CAPSULE | Freq: Every day | ORAL | Status: DC

## 2011-02-08 MED ORDER — ATORVASTATIN CALCIUM 20 MG PO TABS: 20.0000 mg | ORAL_TABLET | Freq: Every day | ORAL | Status: DC

## 2011-02-08 MED ORDER — LISINOPRIL 20 MG PO TABS: 20.0000 mg | ORAL_TABLET | ORAL | Status: DC

## 2011-02-08 MED ORDER — GLIMEPIRIDE 4 MG PO TABS: ORAL_TABLET | ORAL | Status: DC

## 2011-02-08 MED ORDER — SITAGLIPTIN PHOS-METFORMIN HCL 50-1000 MG PO TABS
1.0000 | ORAL_TABLET | Freq: Two times a day (BID) | ORAL | Status: DC
Start: 2011-02-08 — End: 2011-08-16

## 2011-02-08 NOTE — Patient Instructions (Signed)
Please  schedule fasting Labs in 4 months : BMET,Lipids, hepatic panel,  TSH, A1c (250.02, 272.4,401.9) Please think about quitting smoking. Review the risks we discussed. Please call 1-800-QUIT-NOW [phone]) for free smoking cessation counseling.  Consider  [facility]'s smoking cessation program @ www.[place].com or 3160687638.  Consider podiatry referral for the fungal nail changes. You need an eye exam each year.

## 2011-02-08 NOTE — Telephone Encounter (Signed)
Lipid,Hep,BMP,CBCD,TSH,Lipid, PSA, Stool Cards, Udip ,A1c V70.0/272.4/401.9/995.20/250.02

## 2011-02-08 NOTE — Progress Notes (Signed)
Subjective:    Patient ID: Zachary Ensign., male    DOB: 10-Feb-1961, 50 y.o.   MRN: [id number]  HPI Diabetes status assessment: Fasting or morning glucose range:  120-140  Highest glucose 2 hours after any meal:  Not checked. Hypoglycemia : rarely in evening .                                                     Excess thirst :no;  Excess hunger:  no ;  Excess urination:  no.                                  Lightheadedness with standing:  rarely. Chest pain:  no ; Palpitations :no ;  Pain in  calves with walking:  no .                                                                                                                                 Non healing skin  ulcers or sores,especially over the feet:  no. Numbness or tingling or burning in feet : no .                                                                                                                                              Significant change in  Weight : stable. Vision changes : no  .                                                                    Exercise : walking some . Nutrition/diet:  No plan. Medication compliance : yes. Medication adverse  Effects:  no . Eye exam : 2+ years  ago. Foot care : no.  A1c/ urine microalbumin monitor:  A1c continues to improve. On August 10 the A1c was 7.7. In February of this year his A1c was  11.1. That would correspond to an average sugar of 272 and increased risk of > 120%. His average sugar at this time would be 175 with 54% risk long term.     Smoking 2 ppd ; risks discussed( 2-3 X increased risk). Options reviewed    Review of Systems     Objective:   Physical Exam Gen.:  well-nourished in appearance. Alert, appropriate and cooperative throughout exam.  Lungs: Normal respiratory effort; chest expands symmetrically. Lungs : mild rhonchi without rales, wheezes, or increased work of breathing. Heart: Normal rate and rhythm. Normal S1 and S2. No gallop, click, or rub. S4  with slurring; no  murmur.                                                                                  Musculoskeletal/extremities:No clubbing, cyanosis, edema, or deformity noted. Joints normal. Toe nails: mild fungal changes Vascular: Carotid, radial artery, dorsalis pedis and  posterior tibial pulses are full and equal. No bruits present. Neurologic: Alert and oriented x3. Deep tendon reflexes symmetrical and normal. Light touch normal over feet      Skin: Intact without suspicious lesions or rashes.Marked drying over hands  Psych: Mood and affect are normal. Normally interactive                                                                                         Assessment & Plan:  #1 diabetes, dramatic improvement but  A1c not at goal  #2 hypertension controlled  #3 smoking, rest discussed

## 2011-02-08 NOTE — Telephone Encounter (Signed)
Patient scheduled cpx [id number] - lab [phone] need lab order

## 2011-08-08 ENCOUNTER — Other Ambulatory Visit: Payer: Self-pay | Admitting: Internal Medicine

## 2011-08-08 DIAGNOSIS — IMO0001 Reserved for inherently not codable concepts without codable children: Secondary | ICD-10-CM

## 2011-08-08 DIAGNOSIS — Z0000 Encounter for general adult medical examination without abnormal findings: Secondary | ICD-10-CM

## 2011-08-08 DIAGNOSIS — I10 Essential (primary) hypertension: Secondary | ICD-10-CM

## 2011-08-08 DIAGNOSIS — E785 Hyperlipidemia, unspecified: Secondary | ICD-10-CM

## 2011-08-08 DIAGNOSIS — T887XXA Unspecified adverse effect of drug or medicament, initial encounter: Secondary | ICD-10-CM

## 2011-08-09 ENCOUNTER — Other Ambulatory Visit (INDEPENDENT_AMBULATORY_CARE_PROVIDER_SITE_OTHER): Payer: Managed Care, Other (non HMO)

## 2011-08-09 DIAGNOSIS — E785 Hyperlipidemia, unspecified: Secondary | ICD-10-CM

## 2011-08-09 DIAGNOSIS — IMO0001 Reserved for inherently not codable concepts without codable children: Secondary | ICD-10-CM

## 2011-08-09 DIAGNOSIS — I10 Essential (primary) hypertension: Secondary | ICD-10-CM

## 2011-08-09 DIAGNOSIS — Z0000 Encounter for general adult medical examination without abnormal findings: Secondary | ICD-10-CM

## 2011-08-09 DIAGNOSIS — T887XXA Unspecified adverse effect of drug or medicament, initial encounter: Secondary | ICD-10-CM

## 2011-08-09 LAB — LIPID PANEL
Cholesterol: 189 mg/dL (ref 0–200)
Total CHOL/HDL Ratio: 6
Triglycerides: 169 mg/dL — ABNORMAL HIGH (ref 0.0–149.0)

## 2011-08-09 LAB — CBC WITH DIFFERENTIAL/PLATELET
Eosinophils Relative: 2 % (ref 0.0–5.0)
HCT: 51.6 % (ref 39.0–52.0)
Hemoglobin: 17.5 g/dL — ABNORMAL HIGH (ref 13.0–17.0)
Lymphocytes Relative: 28.3 % (ref 12.0–46.0)
Lymphs Abs: 2.3 10*3/uL (ref 0.7–4.0)
Monocytes Relative: 12.2 % — ABNORMAL HIGH (ref 3.0–12.0)
Platelets: 188 10*3/uL (ref 150.0–400.0)
WBC: 8.1 10*3/uL (ref 4.5–10.5)

## 2011-08-09 LAB — BASIC METABOLIC PANEL
CO2: 28 mEq/L (ref 19–32)
Chloride: 100 mEq/L (ref 96–112)
Creatinine, Ser: 1.2 mg/dL (ref 0.4–1.5)
Potassium: 5.9 mEq/L — ABNORMAL HIGH (ref 3.5–5.1)
Sodium: 135 mEq/L (ref 135–145)

## 2011-08-09 LAB — POCT URINALYSIS DIPSTICK
Bilirubin, UA: NEGATIVE
Glucose, UA: NEGATIVE
Ketones, UA: NEGATIVE
Leukocytes, UA: NEGATIVE
Spec Grav, UA: 1.01

## 2011-08-09 LAB — HEPATIC FUNCTION PANEL
AST: 21 U/L (ref 0–37)
Albumin: 4.1 g/dL (ref 3.5–5.2)
Alkaline Phosphatase: 75 U/L (ref 39–117)
Bilirubin, Direct: 0.1 mg/dL (ref 0.0–0.3)
Total Protein: 6.8 g/dL (ref 6.0–8.3)

## 2011-08-09 LAB — TSH: TSH: 2.48 u[IU]/mL (ref 0.35–5.50)

## 2011-08-09 LAB — PSA: PSA: 1.16 ng/mL (ref 0.10–4.00)

## 2011-08-09 NOTE — Progress Notes (Signed)
LABS ONLY  

## 2011-08-12 ENCOUNTER — Telehealth: Payer: Self-pay

## 2011-08-12 NOTE — Telephone Encounter (Signed)
Spoke with patient, patient aware to bring all medication bottles and any glucose readings to pending appointment

## 2011-08-12 NOTE — Telephone Encounter (Signed)
Message copied by Maurice Small on Mon Aug 12, 2011  8:48 AM ------      Message from: Pecola Lawless      Created: Sun Aug 11, 2011  9:48 AM       Please ask him to bring all meds & his glucose disry to 2/22 appt. Diabetes control needs to be addressed; meds will need to be adjusted

## 2011-08-16 ENCOUNTER — Ambulatory Visit (INDEPENDENT_AMBULATORY_CARE_PROVIDER_SITE_OTHER): Payer: Managed Care, Other (non HMO) | Admitting: Internal Medicine

## 2011-08-16 ENCOUNTER — Encounter: Payer: Self-pay | Admitting: Internal Medicine

## 2011-08-16 VITALS — BP 122/80 | HR 77 | Temp 98.4°F | Ht 67.0 in | Wt 225.0 lb

## 2011-08-16 DIAGNOSIS — E785 Hyperlipidemia, unspecified: Secondary | ICD-10-CM

## 2011-08-16 DIAGNOSIS — Z0000 Encounter for general adult medical examination without abnormal findings: Secondary | ICD-10-CM

## 2011-08-16 DIAGNOSIS — IMO0001 Reserved for inherently not codable concepts without codable children: Secondary | ICD-10-CM

## 2011-08-16 DIAGNOSIS — I10 Essential (primary) hypertension: Secondary | ICD-10-CM

## 2011-08-16 MED ORDER — METFORMIN HCL 1000 MG PO TABS: ORAL_TABLET | ORAL | Status: DC

## 2011-08-16 MED ORDER — LIRAGLUTIDE 18 MG/3ML ~~LOC~~ SOLN: 1.2000 mg | Freq: Every day | SUBCUTANEOUS | Status: DC

## 2011-08-16 MED ORDER — ROSUVASTATIN CALCIUM 10 MG PO TABS: ORAL_TABLET | ORAL | Status: DC

## 2011-08-16 NOTE — Progress Notes (Signed)
Subjective:    Patient ID: Zachary Ensign., male    DOB: November 30, 1960, 51 y.o.   MRN: [id number]  HPI  Zachary Clay is here for a physical; he denies acute issues.      Review of Systems HYPERTENSION: Disease Monitoring: Blood pressure range-not checked  Chest pain, palpitations- no      Dyspnea- no , he describes "choking" sometimes with his CPAP. He continues to smoke a half-pack per day but denies cough or wheezing Medications: Compliance-yes  Lightheadedness,Syncope- no    Edema- no  DIABETES: Disease Monitoring: Blood Sugar ranges-FBS "around 140"  Polyuria/phagia/dipsia-"stays thirsty"     Visual problems- no Medications: Compliance- yes Diet:"watches carbs" Hypoglycemic symptoms- rarely only if meal intake blood delayed. 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 Alert, appropriate and cooperative throughout exam. Head: Normocephalic without obvious abnormalities;  pattern alopecia  Eyes: No corneal or conjunctival inflammation noted. Pupils equal round reactive to light and accommodation. Fundal exam is benign without hemorrhages, exudate, papilledema. Extraocular motion intact. Vision grossly normal. Ears: External  ear exam reveals no significant lesions or deformities. Canals clear .TMs normal. Hearing is grossly normal bilaterally. Nose: External nasal exam reveals no deformity or inflammation. Nasal mucosa are pink and moist. No lesions or exudates noted.   Mouth: Oral mucosa and oropharynx reveal no lesions or exudates. Teeth in good repair. Neck: No deformities, masses, or tenderness noted. Range of motion&  Thyroid normal Lungs: Normal respiratory effort; chest  expands symmetrically. Lungs are clear to auscultation without rales, wheezes, or increased work of breathing. Heart: Normal rate and rhythm. Normal S1 and S2. No gallop, click, or rub. No murmur. Abdomen: Bowel sounds normal; abdomen soft and nontender. No masses, organomegaly .Ventral hernia noted. Genitalia/DRE: Genital exam is unremarkable. Prostate is flat without enlargement, nodularity, asymmetry, or induration.                                                                      Musculoskeletal/extremities: No deformity or scoliosis noted of  the thoracic or lumbar spine. No clubbing, cyanosis, edema, or deformity noted. Range of motion  normal .Tone & strength  normal.Joints normal. Some chronic fungal changes of toenails Vascular: Carotid, radial artery, dorsalis pedis and  posterior tibial pulses are full and equal. No bruits present. Neurologic: Alert and oriented x3. Deep tendon reflexes symmetrical and normal. Light touch normal over feet        Skin: Intact without suspicious lesions or rashes. Marked drying and cracking of the extremities, especially the hands. Lymph: No cervical, axillary, or inguinal lymphadenopathy present. Psych: Mood and affect are normal. Normally interactive  Assessment & Plan:  #1 comprehensive physical exam; no acute findings #2 see Problem List with Assessments & Recommendations Plan: see Orders

## 2011-08-16 NOTE — Assessment & Plan Note (Signed)
LDL is not at goal of less than 100, ideally less than 70 on atorvastatin 20 mg daily. He'll be changed to Crestor 20 mg daily with followup labs in 10 weeks

## 2011-08-16 NOTE — Patient Instructions (Signed)
The most common cause of elevated triglycerides is the ingestion of sugar from high fructose corn syrup sources added to processed foods & drinks.  Eat a low-fat diet with lots of fruits and vegetables, up to 7-9 servings per day. Consume less than 40 (preferably ZERO) grams of sugar per day from foods & drinks with High Fructose Corn Syrup (HFCS) sugar as #1,2,3 or # 4 on label.Whole Foods, [employer] & [employer] do not carry products with HFCS. Follow a  low carb nutrition program such as [place] or [employer]  to prevent Diabetes progression . White carbohydrates (potatoes, rice, bread, and pasta) have a high spike of sugar and a high load of sugar. For example a  baked potato has a cup of sugar and a  french fry  2 teaspoons of sugar. Yams, wild  rice, whole grained bread &  wheat pasta have been much lower spike and load of  sugar. Portions should be the size of a deck of cards or your palm.  Please  schedule fasting Labsin 10 weeks : Lipids, hepatic panel, A1c. PLEASE BRING THESE INSTRUCTIONS TO FOLLOW UP  LAB APPOINTMENT.This will guarantee correct labs are drawn, eliminating need for repeat blood sampling ( needle sticks ! ). Diagnoses /Codes:272.4,250.02 Please think about quitting smoking. Review the risks we discussed. Please call 1-800-QUIT-NOW ([phone]) for free smoking cessation counseling.

## 2011-08-16 NOTE — Assessment & Plan Note (Signed)
Blood pressure is well controlled. EKG reveals early repolarization ST-T changes. Low voltage  suggests possible pulmonary disease.

## 2011-08-16 NOTE — Assessment & Plan Note (Signed)
Diabetes is poorly controlled with an A1c of 9.1%; this would be associated with an average sugar of 215 and increased risk of premature heart attack or stroke of over 80%. Nutritional interventions were discussed. He does have rare hypoglycemia which appears to be related to delayed meal intake of medications. Sulfonylurea will be changed to Victoza with repeat of  the A1c in 10 weeks

## 2011-08-26 ENCOUNTER — Telehealth: Payer: Self-pay

## 2011-08-26 NOTE — Telephone Encounter (Signed)
[facility] Triage Call Report Triage Record Num: [id number] Operator: Trey Paula Patient Name: Zachary Clay Call Date & Time: 08/24/2011 7:20:45PM Patient Phone: [phone] PCP: Marga Melnick Patient Gender: Male PCP Fax : [phone] Patient DOB: 07-26-1960 Practice Name: [facility] Reason for Call: Caller: Norma Fredrickson; PCP: Marga Melnick; CB#: [phone]; Call regarding Medication Question; upped Victoza on 08/22/11, now has HA and feels very weak, vision is not right. Wife concerned, Glucophage is due tonight, shot was taken this morning. Blood Sugar 94 before dinner. He was told his blood sugar was to be around or under 120. All emergent sxs r/o per "Diabetes: Control Problems" protocol with the exception of "New or increasing sympotms OR glucose not within provider defined guidelines AND prescribed change in medication or treatment plan in last 72 hours." Provider called and spoke with Dr. Purvis Sheffield to Hold Glucophage tonight, push fluids, and eat peanut butter sandwich or high protein snack. To call back in AM or sooner if sxs worsen or if concerned. Spouse verbalizes understanding of instructions. Protocol(s) Used: Diabetes: Control Problems Recommended Outcome per Protocol: See Provider within 4 hours Override Outcome if Used in Protocol: Call Provider Immediately RN Reason for Override Outcome: Nursing Judgement Used. Reason for Outcome: New or increasing symptoms OR glucose not within provider defined guidelines AND prescribed change in medication or treatment plan in last 72 hours Care Advice: ~ Another adult should drive. Follow the usual meal plan if possible. Drink extra non-caloric fluids. If unable to eat at all, drink regular soft drinks and juices so that 50 grams of carbohydrates are taken in every 3 to 4 hours. ~ ~ Call provider if symptoms worsen before scheduled appointment. ~ List, or take, all current prescription(s),  nonprescription or alternative medication(s) to provider for evaluation. Medication Advice: - Discontinue all nonprescription and alternative medications, especially stimulants, until evaluated by provider. - Take prescribed medications as directed, following label instructions for the medication. - Do not change medications or dosing regimen until provider is consulted. - Know possible side effects of medication and what to do if they occur. - Tell provider all prescription, nonprescription or alternative medications that you take ~ High Blood Sugar Treatment: - Follow action plan. - Adjust medications if instructed to do so in action plan or by provider. - Test blood sugar and ketones more often. - Record blood sugar and ketones in meter log or separate logbook, and take to all provider visits. - Drink extra water and other non-sugar fluids to prevent dehydration when the blood sugar is high and urine ketones are present. ~ 03/

## 2011-08-27 NOTE — Telephone Encounter (Signed)
Decrease Victoza to 0.6  Daily;re check A1c .  Goals for home glucose monitoring are : fasting  or morning glucose goal of  100-150. Check this Monday, Wednesday, Friday, and Sunday.  Goals for sugars 2 hours after any meal (2 hours from the "first bite")  = < 180, preferably < 160. Check 2 hours after breakfast on Tuesday; 2 hours after lunch on Thursday; and 2 hours after the evening meal  on Saturday. Report any low blood glucoses immediately.

## 2011-08-27 NOTE — Telephone Encounter (Signed)
Addended by: Candie Echevaria L on: 08/27/2011 03:55 PM   Modules accepted: Orders

## 2011-08-27 NOTE — Telephone Encounter (Signed)
Discuss with patient. Pt wife also report that Pt is following a strict diet and has lost 10 lbs so far.

## 2011-10-05 ENCOUNTER — Other Ambulatory Visit: Payer: Self-pay | Admitting: Internal Medicine

## 2011-10-23 ENCOUNTER — Telehealth: Payer: Self-pay | Admitting: *Deleted

## 2011-10-23 NOTE — Telephone Encounter (Signed)
Please  schedule fasting Labs : BMET,Lipids, hepatic panel, CBC & dif, TSH, A1c , urine microalbumin.  V70.0

## 2011-10-23 NOTE — Telephone Encounter (Signed)
Pt scheduled to have CPX on 11-01-11. Pt would like to have CPX labs prior to appt please advise on labs and orders.   Lab appt schedule for 10-31-11

## 2011-10-24 NOTE — Telephone Encounter (Signed)
Pt is only scheduled for labs on 11-01-11. Pt had CPE on 08-16-11 and is only due for labs. Lmovm for pt spouse to return call.

## 2011-10-25 ENCOUNTER — Telehealth: Payer: Self-pay | Admitting: Internal Medicine

## 2011-10-25 NOTE — Telephone Encounter (Signed)
Stop the shots; decrease metformin 1000 mg to one half twice a day with 2 largest meals until seen

## 2011-10-25 NOTE — Telephone Encounter (Signed)
Caller: Lynn/Spouse; PCP: Marga Melnick;;  Call regarding Low Blood Sugar Last Several Days, Pt Is Out of Town W/Job; Low Readings started on 10/23/11- felt dizzy at work. Reading < 100 and did not take medication( GLUCOPHAGE ) that night. Next morning 10/24/11 Blood glucose =70 and had some candy and ate good lunch. Took Metformin at noon but sugar that night low so he did not take Glucophage again.  He has lost weight- Has labs on 10/31/11 and appnt on 11/01/11. HE IS NOT TAKING LIRAGLUTIDE SHOTS IN THE MORNING FOR PAST 2 DAYS AND SKIPPED GLUCOPHAGE 2 NIGHTS FOR SUGAR < 100. HE IS CHECKING SUGAR 2 X DAILY AND WONDERING IF HE CAN STOP TAKING METFORMIN AT NOON. HE IS OUT OF TOWN IN [place] BUT WILL BE IN FOR LABS AND APPNT NEXT WEEK. PLEASE CALL LYNN TO LET HER KNOW WHAT DR. HOPPER SUGGESTS.    CB#: [phone]  Triage and care advice per Diabetes:Control Problems Protocol.

## 2011-10-25 NOTE — Telephone Encounter (Signed)
Discuss with patient  

## 2011-10-31 ENCOUNTER — Other Ambulatory Visit: Payer: Managed Care, Other (non HMO) | Admitting: Internal Medicine

## 2011-10-31 ENCOUNTER — Other Ambulatory Visit (INDEPENDENT_AMBULATORY_CARE_PROVIDER_SITE_OTHER): Payer: Managed Care, Other (non HMO)

## 2011-10-31 DIAGNOSIS — Z0000 Encounter for general adult medical examination without abnormal findings: Secondary | ICD-10-CM

## 2011-10-31 DIAGNOSIS — E119 Type 2 diabetes mellitus without complications: Secondary | ICD-10-CM

## 2011-10-31 DIAGNOSIS — E785 Hyperlipidemia, unspecified: Secondary | ICD-10-CM

## 2011-10-31 DIAGNOSIS — I10 Essential (primary) hypertension: Secondary | ICD-10-CM

## 2011-10-31 LAB — HEPATIC FUNCTION PANEL
AST: 20 U/L (ref 0–37)
Bilirubin, Direct: 0.1 mg/dL (ref 0.0–0.3)
Total Bilirubin: 0.8 mg/dL (ref 0.3–1.2)

## 2011-10-31 LAB — CBC WITH DIFFERENTIAL/PLATELET
Basophils Relative: 0.5 % (ref 0.0–3.0)
Eosinophils Relative: 2 % (ref 0.0–5.0)
HCT: 47.3 % (ref 39.0–52.0)
Lymphs Abs: 2 10*3/uL (ref 0.7–4.0)
MCV: 88.3 fl (ref 78.0–100.0)
Monocytes Absolute: 0.6 10*3/uL (ref 0.1–1.0)
RBC: 5.36 Mil/uL (ref 4.22–5.81)
WBC: 7.6 10*3/uL (ref 4.5–10.5)

## 2011-10-31 LAB — LIPID PANEL
HDL: 38.2 mg/dL — ABNORMAL LOW (ref >39.00)
Total CHOL/HDL Ratio: 4
VLDL: 18.6 mg/dL (ref 0.0–40.0)

## 2011-10-31 LAB — BASIC METABOLIC PANEL
BUN: 18 mg/dL (ref 6–23)
CO2: 26 mEq/L (ref 19–32)
Calcium: 9.3 mg/dL (ref 8.4–10.5)
Glucose, Bld: 133 mg/dL — ABNORMAL HIGH (ref 70–99)
Sodium: 140 mEq/L (ref 135–145)

## 2011-10-31 LAB — MICROALBUMIN / CREATININE URINE RATIO: Microalb Creat Ratio: 4.5 mg/g (ref 0.0–30.0)

## 2011-10-31 NOTE — Progress Notes (Signed)
Lab only 

## 2011-10-31 NOTE — Telephone Encounter (Signed)
Pt not due for cpx labs. Pt had labs done today will have f/u to discuss on tomorrow.

## 2011-11-01 ENCOUNTER — Other Ambulatory Visit: Payer: Managed Care, Other (non HMO)

## 2011-11-01 ENCOUNTER — Ambulatory Visit (INDEPENDENT_AMBULATORY_CARE_PROVIDER_SITE_OTHER): Payer: Managed Care, Other (non HMO) | Admitting: Internal Medicine

## 2011-11-01 VITALS — BP 138/66 | HR 84 | Temp 97.8°F | Wt 199.0 lb

## 2011-11-01 DIAGNOSIS — IMO0001 Reserved for inherently not codable concepts without codable children: Secondary | ICD-10-CM

## 2011-11-01 DIAGNOSIS — E785 Hyperlipidemia, unspecified: Secondary | ICD-10-CM

## 2011-11-01 DIAGNOSIS — I10 Essential (primary) hypertension: Secondary | ICD-10-CM

## 2011-11-01 DIAGNOSIS — F17200 Nicotine dependence, unspecified, uncomplicated: Secondary | ICD-10-CM

## 2011-11-01 MED ORDER — ESOMEPRAZOLE MAGNESIUM 40 MG PO CPDR: 40.0000 mg | DELAYED_RELEASE_CAPSULE | Freq: Every day | ORAL | Status: DC

## 2011-11-01 MED ORDER — GLUCOSE BLOOD VI STRP: ORAL_STRIP | Status: DC

## 2011-11-01 MED ORDER — ROSUVASTATIN CALCIUM 20 MG PO TABS: 20.0000 mg | ORAL_TABLET | Freq: Every day | ORAL | Status: DC

## 2011-11-01 MED ORDER — LISINOPRIL 20 MG PO TABS: 10.0000 mg | ORAL_TABLET | ORAL | Status: DC

## 2011-11-01 NOTE — Assessment & Plan Note (Signed)
Incredibly well motivated with dramatic improvement in diabetic control. The Victoza will be held.

## 2011-11-01 NOTE — Progress Notes (Signed)
Subjective:    Patient ID: Zachary Clay., male    DOB: Sep 05, 1960, 51 y.o.   MRN: [id number]  HPI Diabetes status assessment: Fasting or morning glucose range: 99-145   Highest glucose 2 hours after any meal:  < 180. Hypoglycemia :  79 on Victoza .                                                                                                                    Exercise :physical job . Medication compliance : metformin was increased back to a whole pill when fasting glucose reached 145.  Eye exam : overdue. Foot care : no  A1c/ urine microalbumin monitor:  A1c 6.7%; it was 11.1% in February 2012. Average sugar and long-term risk were discussed.  Urine microalbumin has dropped from a high of 12.4 two  years ago to 4.1. LDL cholesterol is 125 on Crestor 10 mg daily.    Review of Systems Excess thirst ; hunger; urination: no                                 Lightheadedness with standing:  Once with glucose 79. Chest pain ; Palpitations ;  Pain in  calves with walking:  no.                                                                                                                                 Non healing skin  ulcers or sores,especially over the feet:  no. Numbness or tingling or burning in feet : no .                                                                                                                                              Significant  change in  Weight : 28 # with diet change ( " no HFCS, bread & potatoes") & Victoza. Vision changes : no  .          Objective:   Physical Exam Gen.: Healthy  & well-nourished, appropriate and alert, weight DRAMATICALLY better Eyes: No lid/conjunctival changes, extraocular motion intact Neck: Normal range of motion, thyroid normal Respiratory: No increased work of breathing or abnormal breath sounds Cardiac : regular rhythm, no extra heart sounds, gallop,Grade 1/ 6 systolic murmur Abdomen: No organomegaly ,masses, bruits or  aortic enlargement Lymph: No lymphadenopathy of the neck or axilla Skin: No rashes, lesions, ulcers or ischemic changes Muscle skeletal: no nail changes; joints normal Vasc:All pulses intact, no bruits present Neuro: Normal deep tendon reflexes, alert & oriented, sensation over feet normal Psych: judgment and insight, mood and affect normal . INCREDIBLY motivated        Assessment & Plan:

## 2011-11-01 NOTE — Assessment & Plan Note (Signed)
If blood pressure averages greater than 135/85; he  can increase the lisinopril to a whole pill

## 2011-11-01 NOTE — Patient Instructions (Signed)
Blood Pressure Goal  Ideally is an AVERAGE < 135/85. This AVERAGE should be calculated from @ least 5-7 BP readings taken @ different times of day on different days of week. You should not respond to isolated BP readings , but rather the AVERAGE for that week . Please  schedule fasting Labs in 3 months : Lipids, AST, ALT, A1c. PLEASE BRING THESE INSTRUCTIONS TO FOLLOW UP  LAB APPOINTMENT.This will guarantee correct labs are drawn, eliminating need for repeat blood sampling ( needle sticks ! ). Diagnoses /Codes: 250.00, 995.20, 272.4 Consider  [facility]'s smoking cessation program @ www.[place].[email or web address] or 567-348-5900.

## 2011-11-01 NOTE — Progress Notes (Signed)
Addended by: Candie Echevaria L on: 11/01/2011 12:31 PM   Modules accepted: Orders

## 2011-11-01 NOTE — Assessment & Plan Note (Signed)
Crestor will be increased to 20 mg daily; LDL goal is less than 100, ideally less than 70

## 2011-11-01 NOTE — Assessment & Plan Note (Signed)
Wrist discussed. Based on how well he has addressed his diabetes risk; I expect him to make changes in this area as well in the  future

## 2012-03-09 ENCOUNTER — Other Ambulatory Visit: Payer: Self-pay | Admitting: Internal Medicine

## 2012-03-10 NOTE — Telephone Encounter (Signed)
Pt needs ov before more refills

## 2012-04-10 ENCOUNTER — Other Ambulatory Visit: Payer: Self-pay | Admitting: Internal Medicine

## 2012-04-10 DIAGNOSIS — E119 Type 2 diabetes mellitus without complications: Secondary | ICD-10-CM

## 2012-04-13 MED ORDER — METFORMIN HCL 1000 MG PO TABS: ORAL_TABLET | ORAL | Status: DC

## 2012-04-13 NOTE — Telephone Encounter (Signed)
RXs sent in 

## 2012-04-13 NOTE — Telephone Encounter (Signed)
refill metformin hcl 1000mg  tablet #60 take 1 tablet twice a day with meals -- last fill 9.17.13--last ov 5.10.13 follow up

## 2012-05-12 ENCOUNTER — Other Ambulatory Visit: Payer: Self-pay | Admitting: Internal Medicine

## 2012-05-12 NOTE — Telephone Encounter (Signed)
Rx sent.    MW 

## 2012-06-22 ENCOUNTER — Other Ambulatory Visit: Payer: Self-pay | Admitting: Internal Medicine

## 2012-06-22 ENCOUNTER — Other Ambulatory Visit: Payer: Self-pay | Admitting: *Deleted

## 2012-06-22 DIAGNOSIS — E119 Type 2 diabetes mellitus without complications: Secondary | ICD-10-CM

## 2012-06-22 MED ORDER — METFORMIN HCL 1000 MG PO TABS: 1000.0000 mg | ORAL_TABLET | Freq: Two times a day (BID) | ORAL | Status: DC

## 2012-06-22 NOTE — Telephone Encounter (Signed)
Rx for Metformin 1000mg  #90 sent to [facility] [place]

## 2012-06-22 NOTE — Telephone Encounter (Signed)
Refill done.  

## 2012-09-17 ENCOUNTER — Other Ambulatory Visit: Payer: Self-pay | Admitting: Internal Medicine

## 2012-09-17 NOTE — Telephone Encounter (Signed)
A1c 250.00 (future order placed)

## 2012-11-09 ENCOUNTER — Other Ambulatory Visit: Payer: Self-pay | Admitting: Internal Medicine

## 2012-11-09 NOTE — Telephone Encounter (Signed)
Lipid/Hep/A1c 250.00/272.4/995.20

## 2012-11-11 ENCOUNTER — Telehealth: Payer: Self-pay | Admitting: Internal Medicine

## 2012-11-11 ENCOUNTER — Other Ambulatory Visit: Payer: Self-pay | Admitting: Internal Medicine

## 2012-11-11 MED ORDER — ESOMEPRAZOLE MAGNESIUM 40 MG PO CPDR: DELAYED_RELEASE_CAPSULE | ORAL | Status: DC

## 2012-11-11 NOTE — Telephone Encounter (Signed)
Pt wife called in for her husband to make him a cpe appointment for July 10th. Also pt has ran out of his nexium medication, would like for dr hopper to [facility] [place] until his July appointment. thanks

## 2012-11-11 NOTE — Telephone Encounter (Signed)
Rx sent on 11-09-12 called and confirmed with pharmacy and Rx resent for #30 with 1 refill to ensure Pt has enough to make it to his appt.

## 2012-12-31 ENCOUNTER — Encounter: Payer: Self-pay | Admitting: Internal Medicine

## 2012-12-31 ENCOUNTER — Ambulatory Visit (INDEPENDENT_AMBULATORY_CARE_PROVIDER_SITE_OTHER): Payer: Managed Care, Other (non HMO) | Admitting: Internal Medicine

## 2012-12-31 VITALS — BP 116/80 | HR 76 | Temp 98.3°F | Resp 14 | Ht 66.5 in | Wt 206.0 lb

## 2012-12-31 DIAGNOSIS — F17200 Nicotine dependence, unspecified, uncomplicated: Secondary | ICD-10-CM

## 2012-12-31 DIAGNOSIS — Z0000 Encounter for general adult medical examination without abnormal findings: Secondary | ICD-10-CM

## 2012-12-31 DIAGNOSIS — Z860100 Personal history of colon polyps, unspecified: Secondary | ICD-10-CM

## 2012-12-31 NOTE — Progress Notes (Signed)
Subjective:    Patient ID: Zachary Clay., male    DOB: 11-Feb-1961, 52 y.o.   MRN: [id number]  HPI He is here for a physical;acute issues intermittent loose - watery BMs.He is on Metformin.     Review of Systems HYPERTENSION: Disease Monitoring: Blood pressure range-not monitored  Chest pain, palpitations- no       Dyspnea- no Medications: Compliance-yes Lightheadedness,Syncope- no    Edema- no  DIABETES: Disease Monitoring: Blood Sugar ranges-120-130  Polyuria/phagia/dipsia- stays thirsty       Visual problems- no Medications: Compliance- yes  Hypoglycemic symptoms- no  HYPERLIPIDEMIA: Disease Monitoring: See symptoms for Hypertension Medications: Compliance- yes  Abd pain, bowel changes- see above . He denies abdominal pain, melena, rectal bleeding. He had a colon polyp in 2003. Apparently surveillance was recommended but not completed. He also mentions having a past medical history of some upper endoscopy procedure to evaluate a "knot in his throat" Muscle aches-no          Objective:   Physical Exam Gen.:  well-nourished in appearance. Alert, appropriate and cooperative throughout exam. Head: Normocephalic without obvious abnormalities;  pattern alopecia  Eyes: No corneal or conjunctival inflammation noted. Pupils equal round reactive to light and accommodation.  Extraocular motion intact. Vision grossly normal without lenses Ears: External  ear exam reveals no significant lesions or deformities. Canals clear .TMs normal. Hearing is grossly decreased on L. Nose: External nasal exam reveals no deformity or inflammation. Nasal mucosa are pink and moist. No lesions or exudates noted.  Mouth: Oral mucosa and oropharynx reveal no lesions or exudates. Teeth in good repair. Neck: No deformities, masses, or tenderness noted. Range of motion & Thyroid normal. Lungs: Normal respiratory effort; chest expands symmetrically. Lungs are clear to auscultation without rales,  wheezes, or increased work of breathing. Heart: Normal rate and rhythm. Normal S1 and S2. No gallop, click, or rub.S4 w/o murmur. Abdomen: Bowel sounds normal; abdomen soft and nontender. No masses or organomegaly . Ventral hernia noted. Genitalia: Genitalia normal except for left varices. Prostate is normal without significant enlargement,  nodularity, or induration.  Minor asymmetry ; R > L.                               Musculoskeletal/extremities: There is some asymmetry of the posterior thoracic musculature suggesting occult scoliosis. No clubbing, cyanosis, edema, or significant extremity  deformity noted. Range of motion normal .Tone & strength  Normal. Joints normal. Nail health good. Able to lie down & sit up w/o help. Negative SLR bilaterally Vascular: Carotid, radial artery, dorsalis pedis and  posterior tibial pulses are full and equal. No bruits present. Neurologic: Alert and oriented x3. Deep tendon reflexes symmetrical and normal.      Skin: Intact without suspicious lesions or rashes. Lymph: No cervical, axillary, or inguinal lymphadenopathy present. Psych: Mood and affect are normal. Normally interactive                                                                                        Assessment & Plan:  #1 comprehensive physical exam; no  acute findings #2 IBS vs Metformin effect; trial of Align Plan: see Orders  & Recommendations

## 2012-12-31 NOTE — Patient Instructions (Addendum)
Please take the probiotic , Align, every day until the bowels are normal. This will replace the normal bacteria which  are necessary for formation of normal stool and processing of food. As per the Standard of Care , screening Colonoscopy recommended @ 50 & every 5-10 years thereafter . More frequent monitor would be dictated by family history or findings @ Colonoscopy

## 2013-01-01 ENCOUNTER — Encounter: Payer: Self-pay | Admitting: Internal Medicine

## 2013-01-01 LAB — CBC WITH DIFFERENTIAL/PLATELET
Basophils Absolute: 0 10*3/uL (ref 0.0–0.1)
Eosinophils Relative: 1.6 % (ref 0.0–5.0)
HCT: 47.8 % (ref 39.0–52.0)
Hemoglobin: 16.2 g/dL (ref 13.0–17.0)
Lymphs Abs: 2.6 10*3/uL (ref 0.7–4.0)
Monocytes Relative: 7.7 % (ref 3.0–12.0)
Neutro Abs: 5.9 10*3/uL (ref 1.4–7.7)
RDW: 13.2 % (ref 11.5–14.6)

## 2013-01-01 LAB — TSH: TSH: 1.16 u[IU]/mL (ref 0.35–5.50)

## 2013-01-01 LAB — HEPATIC FUNCTION PANEL
AST: 22 U/L (ref 0–37)
Albumin: 4.3 g/dL (ref 3.5–5.2)
Alkaline Phosphatase: 66 U/L (ref 39–117)
Total Protein: 7 g/dL (ref 6.0–8.3)

## 2013-01-01 LAB — BASIC METABOLIC PANEL
GFR: 78.55 mL/min (ref >60.00)
Glucose, Bld: 118 mg/dL — ABNORMAL HIGH (ref 70–99)
Potassium: 4.7 mEq/L (ref 3.5–5.1)
Sodium: 135 mEq/L (ref 135–145)

## 2013-01-01 LAB — MICROALBUMIN / CREATININE URINE RATIO
Creatinine,U: 61.7 mg/dL
Microalb Creat Ratio: 6.8 mg/g (ref 0.0–30.0)

## 2013-01-28 ENCOUNTER — Telehealth: Payer: Self-pay

## 2013-01-28 ENCOUNTER — Encounter: Payer: Self-pay | Admitting: Internal Medicine

## 2013-01-28 ENCOUNTER — Ambulatory Visit (AMBULATORY_SURGERY_CENTER): Payer: Managed Care, Other (non HMO)

## 2013-01-28 VITALS — Ht 67.0 in | Wt 214.6 lb

## 2013-01-28 DIAGNOSIS — Z860100 Personal history of colon polyps, unspecified: Secondary | ICD-10-CM

## 2013-01-28 MED ORDER — SUPREP BOWEL PREP KIT 17.5-3.13-1.6 GM/177ML PO SOLN: 1.0000 | Freq: Once | ORAL | Status: DC

## 2013-01-28 NOTE — Telephone Encounter (Signed)
Pt wants an EGD added to his colonoscopy.  Stated that "something unusual was seen in my esophagus years ago".  Is it OK to add?

## 2013-02-01 NOTE — Telephone Encounter (Signed)
That is not enough or appropriate medical information to justify an upper endoscopy.  If he wants to pursue further he needs to come see me and discuss it.

## 2013-02-08 ENCOUNTER — Ambulatory Visit (AMBULATORY_SURGERY_CENTER): Payer: Managed Care, Other (non HMO) | Admitting: Internal Medicine

## 2013-02-08 ENCOUNTER — Encounter: Payer: Self-pay | Admitting: Internal Medicine

## 2013-02-08 VITALS — BP 114/79 | HR 62 | Temp 97.4°F | Resp 30 | Ht 67.0 in | Wt 214.0 lb

## 2013-02-08 DIAGNOSIS — D128 Benign neoplasm of rectum: Secondary | ICD-10-CM

## 2013-02-08 DIAGNOSIS — Z1211 Encounter for screening for malignant neoplasm of colon: Secondary | ICD-10-CM

## 2013-02-08 DIAGNOSIS — D126 Benign neoplasm of colon, unspecified: Secondary | ICD-10-CM

## 2013-02-08 HISTORY — PX: COLONOSCOPY: SHX174

## 2013-02-08 LAB — GLUCOSE, CAPILLARY: Glucose-Capillary: 149 mg/dL — ABNORMAL HIGH (ref 70–99)

## 2013-02-08 MED ORDER — SODIUM CHLORIDE 0.9 % IV SOLN: 500.0000 mL | INTRAVENOUS | Status: DC

## 2013-02-08 NOTE — Op Note (Signed)
[facility] [address] [place], [address]   COLONOSCOPY PROCEDURE REPORT  PATIENT: Zachary Clay, Zachary Clay  MR#: [id number] BIRTHDATE: May 02, 1961 , 52  yrs. old GENDER: Male ENDOSCOPIST: Iva Boop, MD, [facility] PROCEDURE DATE:  02/08/2013 PROCEDURE:   Colonoscopy with biopsy and snare polypectomy First Screening Colonoscopy - Avg.  risk and is 50 yrs.  old or older Yes.  Prior Negative Screening - Now for repeat screening. N/A  History of Adenoma - Now for follow-up colonoscopy & has been > or = to 3 yrs.  N/A  Polyps Removed Today? Yes. ASA CLASS:   Class III INDICATIONS:average risk screening and first colonoscopy. MEDICATIONS: propofol (Diprivan) 400mg  IV, MAC sedation, administered by CRNA, and These medications were titrated to patient response per physician's verbal order  DESCRIPTION OF PROCEDURE:   After the risks benefits and alternatives of the procedure were thoroughly explained, informed consent was obtained.  A digital rectal exam revealed no abnormalities of the rectum, A digital rectal exam revealed no prostatic nodules, and A digital rectal exam revealed the prostate was not enlarged.   The LB [id number]  endoscope was introduced through the anus and advanced to the cecum, which was identified by both the appendix and ileocecal valve. No adverse events experienced.   The quality of the prep was Suprep good  The instrument was then slowly withdrawn as the colon was fully examined.   COLON FINDINGS: Three sessile polyps measuring 2-3 mm in size were found in the ascending colon, descending colon, and sigmoid colon. A polypectomy was performed with cold forceps.  The resection was complete and the polyp tissue was completely retrieved.   A sessile polyp measuring 5 mm in size was found in the rectum.  A polypectomy was performed with a cold snare.  The resection was complete and the polyp tissue was completely retrieved.   The  colon mucosa was otherwise normal.  Retroflexed views revealed no abnormalities. The time to cecum=2 minutes 05 seconds.  Withdrawal time=14 minutes 37 seconds.  The scope was withdrawn and the procedure completed. COMPLICATIONS: There were no complications.  ENDOSCOPIC IMPRESSION: 1.   Three sessile polyps measuring 2-3 mm in size were found in the ascending colon, descending colon, and sigmoid colon; polypectomy was performed with cold forceps 2.   Sessile polyp measuring 5 mm in size was found in the rectum; polypectomy was performed with a cold snare 3.   The colon mucosa was otherwise normal - good prep - first screening (did have colonoscopy 2003 to evaluate heme + stool and had polyps that were not pre-cancerous so no tru hx colon polyps)  RECOMMENDATIONS: Timing of repeat colonoscopy will be determined by pathology findings.  eSigned:  Iva Boop, MD, [facility] 02/08/2013 4:11 PM  cc: Pecola Lawless, MD and The Patient

## 2013-02-08 NOTE — Patient Instructions (Addendum)
I found and removed 4 small polyps that look benign. Otherwise normal. Good prep.  I will let you know pathology results and when to have another routine colonoscopy by mail.  I appreciate the opportunity to care for you. Iva Boop, MD, [facility]  Discharge instructions given with verbal understanding. Handout on polyps. Resume previous medications. YOU HAD AN ENDOSCOPIC PROCEDURE TODAY AT THE [facility]: Refer to the procedure report that was given to you for any specific questions about what was found during the examination.  If the procedure report does not answer your questions, please call your gastroenterologist to clarify.  If you requested that your care partner not be given the details of your procedure findings, then the procedure report has been included in a sealed envelope for you to review at your convenience later.  YOU SHOULD EXPECT: Some feelings of bloating in the abdomen. Passage of more gas than usual.  Walking can help get rid of the air that was put into your GI tract during the procedure and reduce the bloating. If you had a lower endoscopy (such as a colonoscopy or flexible sigmoidoscopy) you may notice spotting of blood in your stool or on the toilet paper. If you underwent a bowel prep for your procedure, then you may not have a normal bowel movement for a few days.  DIET: Your first meal following the procedure should be a light meal and then it is ok to progress to your normal diet.  A half-sandwich or bowl of soup is an example of a good first meal.  Heavy or fried foods are harder to digest and may make you feel nauseous or bloated.  Likewise meals heavy in dairy and vegetables can cause extra gas to form and this can also increase the bloating.  Drink plenty of fluids but you should avoid alcoholic beverages for 24 hours.  ACTIVITY: Your care partner should take you home directly after the procedure.  You should plan to take it easy, moving slowly for  the rest of the day.  You can resume normal activity the day after the procedure however you should NOT DRIVE or use heavy machinery for 24 hours (because of the sedation medicines used during the test).    SYMPTOMS TO REPORT IMMEDIATELY: A gastroenterologist can be reached at any hour.  During normal business hours, 8:30 AM to 5:00 PM Monday through Friday, call [phone].  After hours and on weekends, please call the GI answering service at [phone] who will take a message and have the physician on call contact you.   Following lower endoscopy (colonoscopy or flexible sigmoidoscopy):  Excessive amounts of blood in the stool  Significant tenderness or worsening of abdominal pains  Swelling of the abdomen that is new, acute  Fever of 100F or higher  FOLLOW UP: If any biopsies were taken you will be contacted by phone or by letter within the next 1-3 weeks.  Call your gastroenterologist if you have not heard about the biopsies in 3 weeks.  Our staff will call the home number listed on your records the next business day following your procedure to check on you and address any questions or concerns that you may have at that time regarding the information given to you following your procedure. This is a courtesy call and so if there is no answer at the home number and we have not heard from you through the emergency physician on call, we will assume that you have  returned to your regular daily activities without incident.  SIGNATURES/CONFIDENTIALITY: You and/or your care partner have signed paperwork which will be entered into your electronic medical record.  These signatures attest to the fact that that the information above on your After Visit Summary has been reviewed and is understood.  Full responsibility of the confidentiality of this discharge information lies with you and/or your care-partner.

## 2013-02-08 NOTE — Progress Notes (Signed)
Called to room to assist during endoscopic procedure.  Patient ID and intended procedure confirmed with present staff. Received instructions for my participation in the procedure from the performing physician.  

## 2013-02-08 NOTE — Progress Notes (Signed)
Patient did not experience any of the following events: a burn prior to discharge; a fall within the facility; wrong site/side/patient/procedure/implant event; or a hospital transfer or hospital admission upon discharge from the facility. (G8907) Patient did not have preoperative order for IV antibiotic SSI prophylaxis. (G8918)  

## 2013-02-09 ENCOUNTER — Telehealth: Payer: Self-pay | Admitting: *Deleted

## 2013-02-09 NOTE — Telephone Encounter (Signed)
Left message to call office if questions or concerns. 

## 2013-02-16 ENCOUNTER — Encounter: Payer: Self-pay | Admitting: Internal Medicine

## 2013-02-16 DIAGNOSIS — Z860100 Personal history of colon polyps, unspecified: Secondary | ICD-10-CM

## 2013-02-16 HISTORY — DX: Personal history of colon polyps, unspecified: Z86.0100

## 2013-02-16 HISTORY — DX: Personal history of colonic polyps: Z86.010

## 2013-02-16 NOTE — Progress Notes (Signed)
Quick Note:  2 small adenomas Repeat colonoscopy 2019 ______

## 2013-04-05 ENCOUNTER — Other Ambulatory Visit: Payer: Self-pay | Admitting: Internal Medicine

## 2013-04-06 ENCOUNTER — Other Ambulatory Visit: Payer: Self-pay | Admitting: Internal Medicine

## 2013-04-06 ENCOUNTER — Other Ambulatory Visit: Payer: Self-pay | Admitting: *Deleted

## 2013-04-06 MED ORDER — METFORMIN HCL 1000 MG PO TABS: ORAL_TABLET | ORAL | Status: DC

## 2013-04-06 MED ORDER — ESOMEPRAZOLE MAGNESIUM 40 MG PO CPDR: DELAYED_RELEASE_CAPSULE | ORAL | Status: DC

## 2013-04-06 MED ORDER — LISINOPRIL 20 MG PO TABS: ORAL_TABLET | ORAL | Status: DC

## 2013-04-06 NOTE — Telephone Encounter (Signed)
Crestor refill sent to pharmacy 

## 2013-04-06 NOTE — Telephone Encounter (Signed)
Nexium, Lisinopril and Metformin refills sent to pharmacy

## 2013-10-06 ENCOUNTER — Other Ambulatory Visit: Payer: Self-pay

## 2013-10-06 MED ORDER — METFORMIN HCL 1000 MG PO TABS: ORAL_TABLET | ORAL | Status: DC

## 2014-01-11 ENCOUNTER — Telehealth: Payer: Self-pay

## 2014-01-11 ENCOUNTER — Other Ambulatory Visit (INDEPENDENT_AMBULATORY_CARE_PROVIDER_SITE_OTHER): Payer: Managed Care, Other (non HMO)

## 2014-01-11 DIAGNOSIS — E785 Hyperlipidemia, unspecified: Secondary | ICD-10-CM

## 2014-01-11 DIAGNOSIS — IMO0001 Reserved for inherently not codable concepts without codable children: Secondary | ICD-10-CM

## 2014-01-11 DIAGNOSIS — E1165 Type 2 diabetes mellitus with hyperglycemia: Secondary | ICD-10-CM

## 2014-01-11 LAB — HEMOGLOBIN A1C: Hgb A1c MFr Bld: 11.2 % — ABNORMAL HIGH (ref 4.6–6.5)

## 2014-01-11 LAB — LIPID PANEL
CHOL/HDL RATIO: 4
Cholesterol: 129 mg/dL (ref 0–200)
HDL: 30.9 mg/dL — ABNORMAL LOW (ref >39.00)
LDL Cholesterol: 58 mg/dL (ref 0–99)
NonHDL: 98.1
TRIGLYCERIDES: 199 mg/dL — AB (ref 0.0–149.0)
VLDL: 39.8 mg/dL (ref 0.0–40.0)

## 2014-01-11 NOTE — Telephone Encounter (Signed)
Diabetic Bundle  Pt informed of needing A1C and Lipid panel rechecked.  Pt scheduled appt for lab today at 1:15pm  Labs ordered  Pt is supposed to talk to front staff about switching to another provider due to [facility] retiring

## 2014-01-12 ENCOUNTER — Other Ambulatory Visit: Payer: Self-pay | Admitting: Internal Medicine

## 2014-01-12 DIAGNOSIS — IMO0001 Reserved for inherently not codable concepts without codable children: Secondary | ICD-10-CM

## 2014-01-12 DIAGNOSIS — E1165 Type 2 diabetes mellitus with hyperglycemia: Principal | ICD-10-CM

## 2014-02-10 ENCOUNTER — Encounter: Payer: Self-pay | Admitting: Endocrinology

## 2014-02-10 ENCOUNTER — Ambulatory Visit (INDEPENDENT_AMBULATORY_CARE_PROVIDER_SITE_OTHER): Payer: Managed Care, Other (non HMO) | Admitting: Endocrinology

## 2014-02-10 VITALS — BP 122/82 | HR 69 | Temp 97.6°F | Ht 67.0 in | Wt 202.0 lb

## 2014-02-10 DIAGNOSIS — IMO0001 Reserved for inherently not codable concepts without codable children: Secondary | ICD-10-CM

## 2014-02-10 DIAGNOSIS — E1165 Type 2 diabetes mellitus with hyperglycemia: Principal | ICD-10-CM

## 2014-02-10 LAB — MICROALBUMIN / CREATININE URINE RATIO
Creatinine,U: 144.6 mg/dL
MICROALB UR: 43.7 mg/dL — AB (ref 0.0–1.9)
Microalb Creat Ratio: 30.2 mg/g — ABNORMAL HIGH (ref 0.0–30.0)

## 2014-02-10 LAB — BASIC METABOLIC PANEL
BUN: 16 mg/dL (ref 6–23)
CALCIUM: 9.4 mg/dL (ref 8.4–10.5)
CO2: 28 mEq/L (ref 19–32)
CREATININE: 1 mg/dL (ref 0.4–1.5)
Chloride: 100 mEq/L (ref 96–112)
GFR: 87.8 mL/min (ref >60.00)
GLUCOSE: 248 mg/dL — AB (ref 70–99)
Potassium: 4.4 mEq/L (ref 3.5–5.1)
Sodium: 135 mEq/L (ref 135–145)

## 2014-02-10 MED ORDER — CANAGLIFLOZIN 300 MG PO TABS: 1.0000 | ORAL_TABLET | Freq: Every day | ORAL | Status: DC

## 2014-02-10 MED ORDER — SITAGLIPTIN PHOSPHATE 100 MG PO TABS: 100.0000 mg | ORAL_TABLET | Freq: Every day | ORAL | Status: DC

## 2014-02-10 MED ORDER — GLUCOSE BLOOD VI STRP: ORAL_STRIP | Status: DC

## 2014-02-10 MED ORDER — GLIMEPIRIDE 4 MG PO TABS: 4.0000 mg | ORAL_TABLET | Freq: Every day | ORAL | Status: DC

## 2014-02-10 NOTE — Progress Notes (Signed)
Subjective:    Patient ID: Zachary Clay., male    DOB: 06-Feb-1961, 53 y.o.   MRN: [id number]  HPI pt states DM was dx'ed in 1998; he has mild if any neuropathy of the lower extremities; he has associated nephropathy; he has never been on insulin; pt says his diet and exercise are not very good; he has never had pancreatitis, severe hypoglycemia or DKA.  He has a cbg meter, but does not use it.  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  He had edema with actos in the past.  Past Medical History  Diagnosis Date  . Diabetes mellitus   . Hyperlipidemia   . Polycythemia 2012    Hgb 17.8  . Personal history of colonic adenomas 02/16/2013    Past Surgical History  Procedure Laterality Date  . Colonoscopy w/ polypectomy  2003    [facility] - no pre-cancerous polyps  . Esophagogastroduodenoscopy  2003    [facility]    History   Social History  . Marital Status: Married    Spouse Name: N/A    Number of Children: N/A  . Years of Education: N/A   Occupational History  . Not on file.   Social History Main Topics  . Smoking status: Current Every Day Smoker -- 0.50 packs/day  . Smokeless tobacco: Not on file     Comment: smoked 1977- present, up to 3 ppd  . Alcohol Use: 3.6 oz/week    6 Cans of beer per week     Comment: Beer  . Drug Use: No  . Sexual Activity: Not on file   Other Topics Concern  . Not on file   Social History Narrative  . No narrative on file    Current Outpatient Prescriptions on File Prior to Visit  Medication Sig Dispense Refill  . CRESTOR 20 MG tablet TAKE 1 TABLET EVERY DAY  30 tablet  0  . esomeprazole (NEXIUM) 40 MG capsule TAKE ONE CAPSULE BEFORE BREAKFAST  30 capsule  0  . glucose blood (ONE TOUCH ULTRA TEST) test strip 1 each by Other route as needed. Check blood sugar daily  100 each  5  . lisinopril (PRINIVIL,ZESTRIL) 20 MG tablet TAKE 1/2 TABLET DAILY AS DIRECTED  45 tablet  1  . metFORMIN (GLUCOPHAGE) 1000 MG  tablet TAKE 1 TABLET TWICE A DAY WITH A MEAL  180 tablet  0  . NEXIUM 40 MG capsule TAKE ONE CAPSULE BEFORE BREAKFAST  90 capsule  0  . ONETOUCH DELICA LANCETS MISC by Does not apply route. Check blood sugar once daily       . rosuvastatin (CRESTOR) 20 MG tablet LABS OVERDUE, 1 by mouth daily  30 tablet  0   No current facility-administered medications on file prior to visit.    Allergies  Allergen Reactions  . Pioglitazone     REACTION: edema    Family History  Problem Relation Age of Onset  . Heart attack Father 27  . Diabetes Mother   . Melanoma Sister   . Diabetes Maternal Grandmother   . Cancer Maternal Grandmother     stomach  . Diabetes Maternal Grandfather   . Stroke Neg Hx   . Colon cancer Neg Hx     BP 122/82  Pulse 69  Temp(Src) 97.6 F (36.4 C) (Oral)  Ht 5\' 7"  (1.702 m)  Wt 202 lb (91.627 kg)  BMI 31.63 kg/m2  SpO2 94%  Review  of Systems denies blurry vision, headache, chest pain, sob, n/v, urinary frequency, excessive diaphoresis, depression, cold intolerance, rhinorrhea, and easy bruising.  He has lost a few lbs.  He has intermittent leg cramps and rhinorrhea.    Objective:   Physical Exam VS: see vs page GEN: no distress HEAD: head: no deformity eyes: no periorbital swelling, no proptosis external nose and ears are normal mouth: no lesion seen NECK: supple, thyroid is not enlarged CHEST WALL: no deformity LUNGS: clear to auscultation CV: reg rate and rhythm, no murmur ABD: abdomen is soft, nontender.  no hepatosplenomegaly.  not distended.  no hernia MUSCULOSKELETAL: muscle bulk and strength are grossly normal.  no obvious joint swelling.  gait is normal and steady EXTEMITIES: no deformity.  no ulcer on the feet.  feet are of normal color and temp.  no edema.  There is bilateral onychomycosis. PULSES: dorsalis pedis intact bilat.  no carotid bruit NEURO:  cn 2-12 grossly intact.   readily moves all 4's.  sensation is intact to touch on the  feet SKIN:  Normal texture and temperature.  No rash or suspicious lesion is visible.   NODES:  None palpable at the neck PSYCH: alert, well-oriented.  Does not appear anxious nor depressed.   Lab Results  Component Value Date   HGBA1C 11.2* 01/11/2014  i have reviewed the following outside records: Office notes  i reviewed electrocardiogram (12/31/12)    Assessment & Plan:  DM: severe exacerbation.   Edema, new to me: this limits DM oral options Occupational status: in this context, he needs to control DM with oral meds if possible.  Weight loss: this helps glycemic control.  He is encouraged to continue.   Patient is advised the following: Patient Instructions  good diet and exercise habits significanly improve the control of your diabetes.  please let me know if you wish to be referred to a dietician.  high blood sugar is very risky to your health.  you should see an eye doctor and dentist every year.  You are at higher than average risk for pneumonia and hepatitis-B.  You should be vaccinated against both.   controlling your blood pressure and cholesterol drastically reduces the damage diabetes does to your body.  this also applies to quitting smoking.  please discuss these with your doctor.  check your blood sugar once a day.  vary the time of day when you check, between before the 3 meals, and at bedtime.  also check if you have symptoms of your blood sugar being too high or too low.  please keep a record of the readings and bring it to your next appointment here.  You can write it on any piece of paper.  please call us sooner if your blood sugar goes below 70, or if you have a lot of readings over 200. i have sent a prescription to your pharmacy, to add 3 additional diabetes meds.   Please come back for a follow-up appointment in 2 weeks.

## 2014-02-10 NOTE — Patient Instructions (Addendum)
good diet and exercise habits significanly improve the control of your diabetes.  please let me know if you wish to be referred to a dietician.  high blood sugar is very risky to your health.  you should see an eye doctor and dentist every year.  You are at higher than average risk for pneumonia and hepatitis-B.  You should be vaccinated against both.   controlling your blood pressure and cholesterol drastically reduces the damage diabetes does to your body.  this also applies to quitting smoking.  please discuss these with your doctor.  check your blood sugar once a day.  vary the time of day when you check, between before the 3 meals, and at bedtime.  also check if you have symptoms of your blood sugar being too high or too low.  please keep a record of the readings and bring it to your next appointment here.  You can write it on any piece of paper.  please call us sooner if your blood sugar goes below 70, or if you have a lot of readings over 200. i have sent a prescription to your pharmacy, to add 3 additional diabetes meds.   Please come back for a follow-up appointment in 2 weeks.

## 2014-02-24 ENCOUNTER — Encounter: Payer: Self-pay | Admitting: Endocrinology

## 2014-02-24 ENCOUNTER — Ambulatory Visit (INDEPENDENT_AMBULATORY_CARE_PROVIDER_SITE_OTHER): Payer: Managed Care, Other (non HMO) | Admitting: Endocrinology

## 2014-02-24 VITALS — BP 112/66 | HR 77 | Temp 97.8°F | Ht 67.0 in | Wt 203.0 lb

## 2014-02-24 DIAGNOSIS — IMO0001 Reserved for inherently not codable concepts without codable children: Secondary | ICD-10-CM

## 2014-02-24 DIAGNOSIS — E1165 Type 2 diabetes mellitus with hyperglycemia: Principal | ICD-10-CM

## 2014-02-24 LAB — BASIC METABOLIC PANEL
BUN: 15 mg/dL (ref 6–23)
CALCIUM: 9.5 mg/dL (ref 8.4–10.5)
CO2: 26 meq/L (ref 19–32)
CREATININE: 1.1 mg/dL (ref 0.4–1.5)
Chloride: 100 mEq/L (ref 96–112)
GFR: 74.12 mL/min (ref >60.00)
GLUCOSE: 136 mg/dL — AB (ref 70–99)
Potassium: 4.8 mEq/L (ref 3.5–5.1)
Sodium: 135 mEq/L (ref 135–145)

## 2014-02-24 NOTE — Progress Notes (Signed)
Subjective:    Patient ID: Zachary Levee., male    DOB: [birth year], 53 y.o.   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  no cbg record, but states cbg's have improved to the low-100's.  pt states he feels well in general.   Past Medical History  Diagnosis Date  . Diabetes mellitus   . Hyperlipidemia   . Polycythemia 2012    Hgb 17.8  . Personal history of colonic adenomas 02/16/2013    Past Surgical History  Procedure Laterality Date  . Colonoscopy w/ polypectomy  2003    [facility] - no pre-cancerous polyps  . Esophagogastroduodenoscopy  2003    [facility]    History   Social History  . Marital Status: Married    Spouse Name: N/A    Number of Children: N/A  . Years of Education: N/A   Occupational History  . Not on file.   Social History Main Topics  . Smoking status: Current Every Day Smoker -- 0.50 packs/day  . Smokeless tobacco: Not on file     Comment: smoked 1977- present, up to 3 ppd  . Alcohol Use: 3.6 oz/week    6 Cans of beer per week     Comment: Beer  . Drug Use: No  . Sexual Activity: Not on file   Other Topics Concern  . Not on file   Social History Narrative  . No narrative on file    Current Outpatient Prescriptions on File Prior to Visit  Medication Sig Dispense Refill  . Canagliflozin (INVOKANA) 300 MG TABS Take 1 tablet (300 mg total) by mouth daily.  30 tablet  11  . esomeprazole (NEXIUM) 40 MG capsule TAKE ONE CAPSULE BEFORE BREAKFAST  30 capsule  0  . glimepiride (AMARYL) 4 MG tablet Take 1 tablet (4 mg total) by mouth daily before breakfast.  90 tablet  3  . glucose blood (ONE TOUCH ULTRA TEST) test strip 1 each by Other route as needed. Check blood sugar daily  100 each  5  . lisinopril  (PRINIVIL,ZESTRIL) 20 MG tablet TAKE 1/2 TABLET DAILY AS DIRECTED  45 tablet  1  . metFORMIN (GLUCOPHAGE) 1000 MG tablet TAKE 1 TABLET TWICE A DAY WITH A MEAL  180 tablet  0  . NEXIUM 40 MG capsule TAKE ONE CAPSULE BEFORE BREAKFAST  90 capsule  0  . ONETOUCH DELICA LANCETS MISC by Does not apply route. Check blood sugar once daily       . rosuvastatin (CRESTOR) 20 MG tablet LABS OVERDUE, 1 by mouth daily  30 tablet  0  . sitaGLIPtin (JANUVIA) 100 MG tablet Take 1 tablet (100 mg total) by mouth daily.  90 tablet  3   No current facility-administered medications on file prior to visit.    Allergies  Allergen Reactions  . Pioglitazone     REACTION: edema    Family History  Problem Relation Age of Onset  . Heart attack Father 26  . Diabetes Mother   . Melanoma Sister   . Diabetes Maternal Grandmother   . Cancer Maternal Grandmother     stomach  . Diabetes Maternal Grandfather   . Stroke Neg Hx   . Colon cancer  Neg Hx     BP 112/66  Pulse 77  Temp(Src) 97.8 F (36.6 C) (Oral)  Ht 5\' 7"  (1.702 m)  Wt 203 lb (92.08 kg)  BMI 31.79 kg/m2  SpO2 93%  Review of Systems He denies hypoglycemia and weight change.     Objective:   Physical Exam VITAL SIGNS:  See vs page GENERAL: no distress Ext: no edema  Lab Results  Component Value Date   HGBA1C 11.2* 01/11/2014      Assessment & Plan:  DM: apparently well-controlled.     Patient is advised the following: Patient Instructions  check your blood sugar once a day.  vary the time of day when you check, between before the 3 meals, and at bedtime.  also check if you have symptoms of your blood sugar being too high or too low.  please keep a record of the readings and bring it to your next appointment here.  You can write it on any piece of paper.  please call us sooner if your blood sugar goes below 70, or if you have a lot of readings over 200. blood tests are being requested for you today.  We'll contact you with  results. Please continue the same medications.   Please come back for a follow-up appointment in 2 months.

## 2014-02-24 NOTE — Patient Instructions (Addendum)
check your blood sugar once a day.  vary the time of day when you check, between before the 3 meals, and at bedtime.  also check if you have symptoms of your blood sugar being too high or too low.  please keep a record of the readings and bring it to your next appointment here.  You can write it on any piece of paper.  please call us sooner if your blood sugar goes below 70, or if you have a lot of readings over 200. blood tests are being requested for you today.  We'll contact you with results. Please continue the same medications.   Please come back for a follow-up appointment in 2 months.

## 2014-04-20 ENCOUNTER — Other Ambulatory Visit: Payer: Self-pay | Admitting: Internal Medicine

## 2014-04-20 ENCOUNTER — Other Ambulatory Visit: Payer: Self-pay

## 2014-04-20 MED ORDER — METFORMIN HCL 1000 MG PO TABS: ORAL_TABLET | ORAL | Status: DC

## 2014-04-26 ENCOUNTER — Ambulatory Visit (INDEPENDENT_AMBULATORY_CARE_PROVIDER_SITE_OTHER): Payer: Managed Care, Other (non HMO) | Admitting: Endocrinology

## 2014-04-26 ENCOUNTER — Encounter: Payer: Self-pay | Admitting: Endocrinology

## 2014-04-26 VITALS — BP 138/74 | HR 74 | Temp 98.4°F | Ht 67.0 in | Wt 217.0 lb

## 2014-04-26 DIAGNOSIS — E1121 Type 2 diabetes mellitus with diabetic nephropathy: Secondary | ICD-10-CM

## 2014-04-26 NOTE — Patient Instructions (Addendum)
check your blood sugar once a day.  vary the time of day when you check, between before the 3 meals, and at bedtime.  also check if you have symptoms of your blood sugar being too high or too low.  please keep a record of the readings and bring it to your next appointment here.  You can write it on any piece of paper.  please call us sooner if your blood sugar goes below 70, or if you have a lot of readings over 200. blood tests are being requested for you today.  We'll contact you with results.   If necessary, we can add "bromocriptine."   Please come back for a follow-up appointment in 3 months.

## 2014-04-26 NOTE — Progress Notes (Signed)
Subjective:    Patient ID: Zachary Clay., male    DOB: 1960/09/24, 53 y.o.   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   Interval history:  pt states he feels well in general.  no cbg record, but states cbg's vary from 90-200's.   Past Medical History  Diagnosis Date  . Diabetes mellitus   . Hyperlipidemia   . Polycythemia 2012    Hgb 17.8  . Personal history of colonic adenomas 02/16/2013    Past Surgical History  Procedure Laterality Date  . Colonoscopy w/ polypectomy  2003    [facility] - no pre-cancerous polyps  . Esophagogastroduodenoscopy  2003    [facility]    History   Social History  . Marital Status: Married    Spouse Name: N/A    Number of Children: N/A  . Years of Education: N/A   Occupational History  . Not on file.   Social History Main Topics  . Smoking status: Current Every Day Smoker -- 0.50 packs/day  . Smokeless tobacco: Not on file     Comment: smoked 1977- present, up to 3 ppd  . Alcohol Use: 3.6 oz/week    6 Cans of beer per week     Comment: Beer  . Drug Use: No  . Sexual Activity: Not on file   Other Topics Concern  . Not on file   Social History Narrative    Current Outpatient Prescriptions on File Prior to Visit  Medication Sig Dispense Refill  . Canagliflozin (INVOKANA) 300 MG TABS Take 1 tablet (300 mg total) by mouth daily. 30 tablet 11  . esomeprazole (NEXIUM) 40 MG capsule TAKE ONE CAPSULE BEFORE BREAKFAST 30 capsule 0  . glimepiride (AMARYL) 4 MG tablet Take 1 tablet (4 mg total) by mouth daily before breakfast. 90 tablet 3  . glucose blood (ONE TOUCH ULTRA TEST) test strip 1 each by Other route as needed. Check blood sugar daily 100 each 5  . lisinopril  (PRINIVIL,ZESTRIL) 20 MG tablet TAKE 1/2 TABLET DAILY AS DIRECTED 45 tablet 1  . lisinopril (PRINIVIL,ZESTRIL) 20 MG tablet TAKE 1/2 TABLET DAILY AS DIRECTED 15 tablet 0  . metFORMIN (GLUCOPHAGE) 1000 MG tablet TAKE 1 TABLET TWICE A DAY WITH A MEAL 60 tablet 0  . NEXIUM 40 MG capsule TAKE ONE CAPSULE BEFORE BREAKFAST 90 capsule 0  . ONETOUCH DELICA LANCETS MISC by Does not apply route. Check blood sugar once daily     . rosuvastatin (CRESTOR) 20 MG tablet LABS OVERDUE, 1 by mouth daily 30 tablet 0  . sitaGLIPtin (JANUVIA) 100 MG tablet Take 1 tablet (100 mg total) by mouth daily. 90 tablet 3   No current facility-administered medications on file prior to visit.    Allergies  Allergen Reactions  . Pioglitazone     REACTION: edema    Family History  Problem Relation Age of Onset  . Heart attack Father 83  . Diabetes Mother   . Melanoma Sister   . Diabetes Maternal Grandmother   . Cancer Maternal Grandmother     stomach  . Diabetes Maternal Grandfather   . Stroke Neg Hx   . Colon cancer Neg Hx     BP 138/74 mmHg  Pulse 74  Temp(Src) 98.4 F (36.9 C) (Oral)  Ht 5\' 7"  (1.702 m)  Wt 217 lb (98.431 kg)  BMI 33.98 kg/m2  SpO2 96%    Review of Systems He has gained a few lbs.  He denies hypoglycemia.    Objective:   Physical Exam VITAL SIGNS:  See vs page GENERAL: no distress Pulses: dorsalis pedis intact bilat.   Feet: no deformity.  no edema.  There is bilateral onychomycosis.   Skin:  no ulcer on the feet.  normal color and temp. Neuro: sensation is intact to touch on the feet.         Assessment & Plan:  DM: uncertain control Noncompliance with cbg recording: I'll work around this as best I can.   Smoker: Pt declines another course of chantix.   Weight-gain: pt is encouraged to re-lose.    Patient is advised the following: Patient Instructions  check your blood sugar once a day.  vary the time of day when you check, between before the 3 meals, and at  bedtime.  also check if you have symptoms of your blood sugar being too high or too low.  please keep a record of the readings and bring it to your next appointment here.  You can write it on any piece of paper.  please call us sooner if your blood sugar goes below 70, or if you have a lot of readings over 200. blood tests are being requested for you today.  We'll contact you with results.   If necessary, we can add "bromocriptine."   Please come back for a follow-up appointment in 3 months.

## 2014-05-04 ENCOUNTER — Encounter: Payer: Self-pay | Admitting: Family Medicine

## 2014-05-04 ENCOUNTER — Ambulatory Visit (INDEPENDENT_AMBULATORY_CARE_PROVIDER_SITE_OTHER): Payer: Managed Care, Other (non HMO) | Admitting: Family Medicine

## 2014-05-04 VITALS — BP 128/82 | HR 82 | Temp 98.1°F | Resp 16 | Wt 217.1 lb

## 2014-05-04 DIAGNOSIS — E785 Hyperlipidemia, unspecified: Secondary | ICD-10-CM

## 2014-05-04 DIAGNOSIS — E1169 Type 2 diabetes mellitus with other specified complication: Secondary | ICD-10-CM

## 2014-05-04 DIAGNOSIS — Z720 Tobacco use: Secondary | ICD-10-CM

## 2014-05-04 DIAGNOSIS — I10 Essential (primary) hypertension: Secondary | ICD-10-CM

## 2014-05-04 DIAGNOSIS — IMO0002 Reserved for concepts with insufficient information to code with codable children: Secondary | ICD-10-CM

## 2014-05-04 DIAGNOSIS — F17200 Nicotine dependence, unspecified, uncomplicated: Secondary | ICD-10-CM

## 2014-05-04 DIAGNOSIS — E1165 Type 2 diabetes mellitus with hyperglycemia: Secondary | ICD-10-CM

## 2014-05-04 NOTE — Assessment & Plan Note (Signed)
New to provider, ongoing for pt.  Following w/ Dr Loanne Drilling.  Has labs pending for Friday at [facility].  Encouraged pt to schedule eye exam.  Will follow.

## 2014-05-04 NOTE — Progress Notes (Signed)
   Subjective:    Patient ID: Zachary Levee., male    DOB: 1960-08-01, 53 y.o.   MRN: [id number]  HPI New.  Previous MD- Linna Darner.  EndoLoanne Drilling.  DM- chronic problem.  Pt now seeing Endo.  Last A1C 11.2.  Has appt for labs on Friday.  Overdue on eye exam.  HTN- chronic problem.  On Lisinopril.  No CP, SOB, HAs, visual changes, edema.  Hyperlipidemia- chronic problem, on Crestor.  No abd pain, N/V, myalgias  Tobacco use- smoking 2 ppd.  Smoking has 'gotten worse' in the last '6-8 yrs'.  Has tried Chantix, 'it worked' but I just quit taking it and now my insurance won't pay for it again.  Nicotine patches caused night sweats and nausea.  'i'll just wait on it, i can't afford it right now'.  Pt declines flu and PNA. Review of Systems For ROS see HPI     Objective:   Physical Exam  Constitutional: He is oriented to person, place, and time. He appears well-developed and well-nourished. No distress.  Smells strongly of cigarette smoke  HENT:  Head: Normocephalic and atraumatic.  Eyes: Conjunctivae and EOM are normal. Pupils are equal, round, and reactive to light.  Neck: Normal range of motion. Neck supple. No thyromegaly present.  Cardiovascular: Normal rate, regular rhythm, normal heart sounds and intact distal pulses.   No murmur heard. Pulmonary/Chest: Effort normal and breath sounds normal. No respiratory distress.  Abdominal: Soft. Bowel sounds are normal. He exhibits no distension.  Musculoskeletal: He exhibits no edema.  Lymphadenopathy:    He has no cervical adenopathy.  Neurological: He is alert and oriented to person, place, and time. No cranial nerve deficit.  Skin: Skin is warm and dry.  Psychiatric: He has a normal mood and affect. His behavior is normal.  Vitals reviewed.         Assessment & Plan:

## 2014-05-04 NOTE — Assessment & Plan Note (Signed)
Pt smoking 2ppd.  Strongly encouraged him to quit.  Pt reports insurance will no longer pay for Chantix since he failed it previously.  Reports Nicotine patches made him sick.  Discussed using coupon for Chantix and paying out of pocket rather than paying for 2 packs of cigarettes daily.  Pt reports he 'can't afford it'.  Will continue to address this at future visits.

## 2014-05-04 NOTE — Patient Instructions (Signed)
Schedule your complete physical in 6 months Go get your labs done on Friday as scheduled TRY AND QUIT SMOKING!!! Call with any questions or concerns Welcome!  We're glad to have you!!!

## 2014-05-04 NOTE — Assessment & Plan Note (Signed)
Chronic problem.  Tolerating statin w/o difficulty.  Reviewed labs done in July- LDL at goal.  Will repeat labs at upcoming visit.

## 2014-05-04 NOTE — Assessment & Plan Note (Signed)
New to provider.  Ongoing for pt.  Mildly elevated but not high enough to warrant med change.  On ACE.  Pt has BMP pending for Friday.  Will follow and adjust meds prn.

## 2014-05-04 NOTE — Progress Notes (Signed)
Pre visit review using our clinic review tool, if applicable. No additional management support is needed unless otherwise documented below in the visit note. 

## 2014-05-06 LAB — HEMOGLOBIN A1C
Hgb A1c MFr Bld: 7.7 % — ABNORMAL HIGH (ref <5.7)
Mean Plasma Glucose: 174 mg/dL — ABNORMAL HIGH (ref <117)

## 2014-05-08 LAB — BASIC METABOLIC PANEL
BUN: 20 mg/dL (ref 6–23)
CALCIUM: 9.5 mg/dL (ref 8.4–10.5)
CO2: 27 meq/L (ref 19–32)
CREATININE: 1.17 mg/dL (ref 0.50–1.35)
Chloride: 98 mEq/L (ref 96–112)
GLUCOSE: 126 mg/dL — AB (ref 70–99)
Potassium: 5.1 mEq/L (ref 3.5–5.3)
Sodium: 134 mEq/L — ABNORMAL LOW (ref 135–145)

## 2014-05-10 ENCOUNTER — Telehealth: Payer: Self-pay | Admitting: Family Medicine

## 2014-05-10 NOTE — Telephone Encounter (Signed)
emmi emailed °

## 2014-05-26 ENCOUNTER — Other Ambulatory Visit: Payer: Self-pay | Admitting: Internal Medicine

## 2014-08-04 ENCOUNTER — Ambulatory Visit: Payer: Managed Care, Other (non HMO) | Admitting: Family Medicine

## 2014-08-09 ENCOUNTER — Encounter: Payer: Self-pay | Admitting: Endocrinology

## 2014-08-09 ENCOUNTER — Ambulatory Visit (INDEPENDENT_AMBULATORY_CARE_PROVIDER_SITE_OTHER): Payer: BLUE CROSS/BLUE SHIELD | Admitting: Endocrinology

## 2014-08-09 VITALS — BP 132/86 | HR 75 | Temp 97.7°F | Ht 67.0 in | Wt 212.0 lb

## 2014-08-09 DIAGNOSIS — E1165 Type 2 diabetes mellitus with hyperglycemia: Secondary | ICD-10-CM

## 2014-08-09 DIAGNOSIS — IMO0002 Reserved for concepts with insufficient information to code with codable children: Secondary | ICD-10-CM

## 2014-08-09 LAB — HEMOGLOBIN A1C: Hgb A1c MFr Bld: 7.7 % — ABNORMAL HIGH (ref 4.6–6.5)

## 2014-08-09 MED ORDER — GLIMEPIRIDE 2 MG PO TABS: 2.0000 mg | ORAL_TABLET | Freq: Every day | ORAL | Status: DC

## 2014-08-09 MED ORDER — METFORMIN HCL 1000 MG PO TABS
ORAL_TABLET | ORAL | Status: DC
Start: 2014-08-09 — End: 2014-11-27

## 2014-08-09 MED ORDER — BROMOCRIPTINE MESYLATE 2.5 MG PO TABS: 2.5000 mg | ORAL_TABLET | Freq: Every day | ORAL | Status: DC

## 2014-08-09 NOTE — Progress Notes (Signed)
Subjective:    Patient ID: Zachary Clay., male    DOB: December 15, 1960, 54 y.o.   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   Interval history:  pt states he feels well in general.  no cbg record, but states cbg's are well-controlled.  Pt says he seldom misses hid DM meds.  He says he has mild hypoglycemia with exertion, or if a meal is delayed. Past Medical History  Diagnosis Date  . Diabetes mellitus   . Hyperlipidemia   . Polycythemia 2012    Hgb 17.8  . Personal history of colonic adenomas 02/16/2013    Past Surgical History  Procedure Laterality Date  . Colonoscopy w/ polypectomy  2003    [facility] - no pre-cancerous polyps  . Esophagogastroduodenoscopy  2003    [facility]    History   Social History  . Marital Status: Married    Spouse Name: N/A  . Number of Children: N/A  . Years of Education: N/A   Occupational History  . Not on file.   Social History Main Topics  . Smoking status: Current Every Day Smoker -- 0.50 packs/day  . Smokeless tobacco: Not on file     Comment: smoked 1977- present, up to 3 ppd  . Alcohol Use: 3.6 oz/week    6 Cans of beer per week     Comment: Beer  . Drug Use: No  . Sexual Activity: Not on file   Other Topics Concern  . Not on file   Social History Narrative    Current Outpatient Prescriptions on File Prior to Visit  Medication Sig Dispense Refill  . Canagliflozin (INVOKANA) 300 MG TABS Take 1 tablet (300 mg total) by mouth daily. 30 tablet 11  . esomeprazole (NEXIUM) 40 MG capsule TAKE ONE CAPSULE BEFORE BREAKFAST 90 capsule 0  . glucose blood (ONE TOUCH ULTRA TEST) test strip 1 each by Other route as needed. Check blood sugar daily 100 each 5  .  lisinopril (PRINIVIL,ZESTRIL) 20 MG tablet TAKE 1/2 TABLET DAILY AS DIRECTED 15 tablet 0  . ONETOUCH DELICA LANCETS MISC by Does not apply route. Check blood sugar once daily     . rosuvastatin (CRESTOR) 20 MG tablet LABS OVERDUE, 1 by mouth daily 30 tablet 0  . sitaGLIPtin (JANUVIA) 100 MG tablet Take 1 tablet (100 mg total) by mouth daily. 90 tablet 3   No current facility-administered medications on file prior to visit.    Allergies  Allergen Reactions  . Pioglitazone     REACTION: edema    Family History  Problem Relation Age of Onset  . Heart attack Father 45  . Diabetes Mother   . Melanoma Sister   . Diabetes Maternal Grandmother   . Cancer Maternal Grandmother     stomach  . Diabetes Maternal Grandfather   . Stroke Neg Hx   . Colon cancer Neg Hx     BP 132/86 mmHg  Pulse 75  Temp(Src) 97.7 F (36.5 C) (Oral)  Ht 5\' 7"  (1.702 m)  Wt 212 lb (96.163 kg)  BMI 33.20 kg/m2  SpO2 91%    Review of Systems He denies LOC and weight change    Objective:   Physical Exam VITAL SIGNS:  See vs page GENERAL: no distress Pulses: dorsalis pedis intact bilat.   MSK: no deformity of the feet CV: no leg edema Skin:  no ulcer on the feet.  normal color and temp on the feet. Neuro: sensation is intact to touch on the feet Ext: There is bilateral onychomycosis of the toenails.      Lab Results  Component Value Date   HGBA1C 7.7* 08/09/2014       Assessment & Plan:  DM: mild exacerbation. Side-effect of rx: hypoglycemia, due to glimepiride.    Patient is advised the following: Patient Instructions  check your blood sugar once a day.  vary the time of day when you check, between before the 3 meals, and at bedtime.  also check if you have symptoms of your blood sugar being too high or too low.  please keep a record of the readings and bring it to your next appointment here.  You can write it on any piece of paper.  please call us sooner if your blood sugar goes below  70, or if you have a lot of readings over 200. blood tests are being requested for you today.  We'll contact you with results.   Based on the results, we'll add "bromocriptine," and reduce the glimepiride.  The bromocriptine has possible side effects of nausea and dizziness.  These go away with time.  You can avoid these by taking it at bedtime, and by taking just take 1/2 pill for the first week.   Please come back for a follow-up appointment in 4 months.

## 2014-08-09 NOTE — Patient Instructions (Addendum)
check your blood sugar once a day.  vary the time of day when you check, between before the 3 meals, and at bedtime.  also check if you have symptoms of your blood sugar being too high or too low.  please keep a record of the readings and bring it to your next appointment here.  You can write it on any piece of paper.  please call us sooner if your blood sugar goes below 70, or if you have a lot of readings over 200. blood tests are being requested for you today.  We'll contact you with results.   Based on the results, we'll add "bromocriptine," and reduce the glimepiride.  The bromocriptine has possible side effects of nausea and dizziness.  These go away with time.  You can avoid these by taking it at bedtime, and by taking just take 1/2 pill for the first week.   Please come back for a follow-up appointment in 4 months.

## 2014-09-04 ENCOUNTER — Other Ambulatory Visit: Payer: Self-pay | Admitting: Family Medicine

## 2014-09-05 NOTE — Telephone Encounter (Signed)
Med filled.  

## 2014-09-06 ENCOUNTER — Other Ambulatory Visit: Payer: Self-pay

## 2014-09-06 MED ORDER — GLIMEPIRIDE 2 MG PO TABS: 2.0000 mg | ORAL_TABLET | Freq: Every day | ORAL | Status: DC

## 2014-09-06 MED ORDER — BROMOCRIPTINE MESYLATE 2.5 MG PO TABS: 2.5000 mg | ORAL_TABLET | Freq: Every day | ORAL | Status: DC

## 2014-09-19 ENCOUNTER — Other Ambulatory Visit: Payer: Self-pay | Admitting: Internal Medicine

## 2014-09-19 ENCOUNTER — Other Ambulatory Visit: Payer: Self-pay | Admitting: Family Medicine

## 2014-09-19 NOTE — Telephone Encounter (Signed)
Rx sent for 30 day supply because patient is due for office visit.  Visit scheduled 09/28/14.

## 2014-09-28 ENCOUNTER — Encounter: Payer: Self-pay | Admitting: Family Medicine

## 2014-09-28 ENCOUNTER — Ambulatory Visit (HOSPITAL_BASED_OUTPATIENT_CLINIC_OR_DEPARTMENT_OTHER)
Admission: RE | Admit: 2014-09-28 | Discharge: 2014-09-28 | Disposition: A | Discharge status: Home / Self Care | Payer: BLUE CROSS/BLUE SHIELD | Source: Ambulatory Visit | Attending: Family Medicine | Admitting: Family Medicine

## 2014-09-28 ENCOUNTER — Ambulatory Visit (INDEPENDENT_AMBULATORY_CARE_PROVIDER_SITE_OTHER): Payer: BLUE CROSS/BLUE SHIELD | Admitting: Family Medicine

## 2014-09-28 VITALS — BP 132/80 | HR 74 | Temp 98.0°F | Resp 16 | Wt 211.4 lb

## 2014-09-28 DIAGNOSIS — Z720 Tobacco use: Secondary | ICD-10-CM | POA: Diagnosis not present

## 2014-09-28 DIAGNOSIS — F17210 Nicotine dependence, cigarettes, uncomplicated: Secondary | ICD-10-CM | POA: Diagnosis not present

## 2014-09-28 DIAGNOSIS — E1169 Type 2 diabetes mellitus with other specified complication: Secondary | ICD-10-CM

## 2014-09-28 DIAGNOSIS — E785 Hyperlipidemia, unspecified: Secondary | ICD-10-CM

## 2014-09-28 DIAGNOSIS — I10 Essential (primary) hypertension: Secondary | ICD-10-CM | POA: Diagnosis not present

## 2014-09-28 DIAGNOSIS — E1165 Type 2 diabetes mellitus with hyperglycemia: Secondary | ICD-10-CM

## 2014-09-28 DIAGNOSIS — F17200 Nicotine dependence, unspecified, uncomplicated: Secondary | ICD-10-CM

## 2014-09-28 DIAGNOSIS — R05 Cough: Secondary | ICD-10-CM | POA: Insufficient documentation

## 2014-09-28 DIAGNOSIS — IMO0002 Reserved for concepts with insufficient information to code with codable children: Secondary | ICD-10-CM

## 2014-09-28 LAB — LIPID PANEL
CHOL/HDL RATIO: 6
Cholesterol: 175 mg/dL (ref 0–200)
HDL: 30.5 mg/dL — ABNORMAL LOW (ref >39.00)
LDL Cholesterol: 115 mg/dL — ABNORMAL HIGH (ref 0–99)
NONHDL: 144.5
Triglycerides: 146 mg/dL (ref 0.0–149.0)
VLDL: 29.2 mg/dL (ref 0.0–40.0)

## 2014-09-28 LAB — BASIC METABOLIC PANEL
BUN: 20 mg/dL (ref 6–23)
CO2: 29 meq/L (ref 19–32)
Calcium: 9.6 mg/dL (ref 8.4–10.5)
Chloride: 102 mEq/L (ref 96–112)
Creatinine, Ser: 1.28 mg/dL (ref 0.40–1.50)
GFR: 62.09 mL/min (ref >60.00)
GLUCOSE: 135 mg/dL — AB (ref 70–99)
Potassium: 4.7 mEq/L (ref 3.5–5.1)
Sodium: 135 mEq/L (ref 135–145)

## 2014-09-28 LAB — CBC WITH DIFFERENTIAL/PLATELET
BASOS PCT: 0.4 % (ref 0.0–3.0)
Basophils Absolute: 0 10*3/uL (ref 0.0–0.1)
EOS PCT: 2.4 % (ref 0.0–5.0)
Eosinophils Absolute: 0.2 10*3/uL (ref 0.0–0.7)
HCT: 50.3 % (ref 39.0–52.0)
Hemoglobin: 17.3 g/dL — ABNORMAL HIGH (ref 13.0–17.0)
LYMPHS ABS: 1.9 10*3/uL (ref 0.7–4.0)
Lymphocytes Relative: 25.7 % (ref 12.0–46.0)
MCHC: 34.4 g/dL (ref 30.0–36.0)
MCV: 86 fl (ref 78.0–100.0)
MONO ABS: 0.7 10*3/uL (ref 0.1–1.0)
MONOS PCT: 9.2 % (ref 3.0–12.0)
NEUTROS ABS: 4.6 10*3/uL (ref 1.4–7.7)
Neutrophils Relative %: 62.3 % (ref 43.0–77.0)
Platelets: 202 10*3/uL (ref 150.0–400.0)
RBC: 5.85 Mil/uL — ABNORMAL HIGH (ref 4.22–5.81)
RDW: 13.3 % (ref 11.5–15.5)
WBC: 7.3 10*3/uL (ref 4.0–10.5)

## 2014-09-28 LAB — HEPATIC FUNCTION PANEL
ALBUMIN: 4.1 g/dL (ref 3.5–5.2)
ALK PHOS: 62 U/L (ref 39–117)
ALT: 18 U/L (ref 0–53)
AST: 17 U/L (ref 0–37)
Bilirubin, Direct: 0.1 mg/dL (ref 0.0–0.3)
TOTAL PROTEIN: 6.8 g/dL (ref 6.0–8.3)
Total Bilirubin: 0.8 mg/dL (ref 0.2–1.2)

## 2014-09-28 LAB — TSH: TSH: 1.61 u[IU]/mL (ref 0.35–4.50)

## 2014-09-28 MED ORDER — ROSUVASTATIN CALCIUM 20 MG PO TABS: ORAL_TABLET | ORAL | Status: DC

## 2014-09-28 MED ORDER — LISINOPRIL 20 MG PO TABS: ORAL_TABLET | ORAL | Status: DC

## 2014-09-28 NOTE — Patient Instructions (Signed)
Follow up in 4-6 weeks to recheck sugars Go downstairs and get your chest xray We'll notify you of your lab results and make any changes if needed RESTART the Amaryl 2mg  daily (this is in addition to the Januvia, Metformin, Bromocriptine, and Invokana) Restart the Crestor Continue the Lisinopril We'll call you with your eye exam Call with any questions or concerns Hang in there!!

## 2014-09-28 NOTE — Progress Notes (Signed)
   Subjective:    Patient ID: Zachary Clay., male    DOB: 1960/11/20, 54 y.o.   MRN: [id number]  HPI HTN- chronic problem, on Lisinopril 10mg  daily.  No CP, SOB above baseline, HAs, visual changes (due for eye exam), edema  Hyperlipidemia- chronic problem, stopped Crestor.  Pt ran out and did not refill it.  Was waiting for med to go generic.  Denies abd pain, N/V, myalgias  DM- chronic problem.  Pt was seeing Dr Loanne Drilling but he is here today b/c he has had very high sugars since stopping the Amaryl and starting Bromocriptine.  252 this AM.  Pt has not called Endo office.  Also on Metformin, Invokana, and Januvia.  Dr Loanne Drilling renewed Amaryl 2mg  on 3/15.   Review of Systems For ROS see HPI     Objective:   Physical Exam  Constitutional: He is oriented to person, place, and time. He appears well-developed and well-nourished. No distress.  HENT:  Head: Normocephalic and atraumatic.  Eyes: Conjunctivae and EOM are normal. Pupils are equal, round, and reactive to light.  Neck: Normal range of motion. Neck supple. No thyromegaly present.  Cardiovascular: Normal rate, regular rhythm, normal heart sounds and intact distal pulses.   No murmur heard. Pulmonary/Chest: Effort normal and breath sounds normal. No respiratory distress.  Abdominal: Soft. Bowel sounds are normal. He exhibits no distension.  Musculoskeletal: He exhibits no edema.  Lymphadenopathy:    He has no cervical adenopathy.  Neurological: He is alert and oriented to person, place, and time. No cranial nerve deficit.  Skin: Skin is warm and dry.  Psychiatric: He has a normal mood and affect. His behavior is normal.  Vitals reviewed.         Assessment & Plan:

## 2014-09-28 NOTE — Progress Notes (Signed)
Pre visit review using our clinic review tool, if applicable. No additional management support is needed unless otherwise documented below in the visit note. 

## 2014-10-02 NOTE — Assessment & Plan Note (Signed)
Chronic problem.  Adequate control.  Asymptomatic.  No med changes at this time.  Will follow.

## 2014-10-02 NOTE — Assessment & Plan Note (Signed)
Chronic problem.  Recent sugars have been poorly controlled.  There appears to have been some confusion at pt's last endo visit.  Pt reports Endo told him to stop Amaryl entirely but according to note, dose was decreased to 2mg  after pt complained of hypoglycemic sxs (endo even provided refills on med).  Pt has not had recent eye exam- referral made.  Restart Amaryl at 2mg  in addition to other meds.  Will follow closely.

## 2014-10-02 NOTE — Assessment & Plan Note (Signed)
Chronic problem.  Pt ran out of statin and did not refill medication.  Stressed importance of remaining on medication and provided pt w/ $3 Crestor copay card.  Will follow.

## 2014-10-02 NOTE — Assessment & Plan Note (Signed)
Chronic problem.  Pt has hx of heavy smoking.  Again stressed need to quit.  Pt is not interested.  Will get CXR to assess.

## 2014-10-26 ENCOUNTER — Encounter: Payer: Self-pay | Admitting: Family Medicine

## 2014-10-26 ENCOUNTER — Ambulatory Visit (INDEPENDENT_AMBULATORY_CARE_PROVIDER_SITE_OTHER): Payer: BLUE CROSS/BLUE SHIELD | Admitting: Family Medicine

## 2014-10-26 VITALS — BP 128/84 | HR 70 | Temp 98.0°F | Resp 16 | Wt 209.2 lb

## 2014-10-26 DIAGNOSIS — IMO0002 Reserved for concepts with insufficient information to code with codable children: Secondary | ICD-10-CM

## 2014-10-26 DIAGNOSIS — E1165 Type 2 diabetes mellitus with hyperglycemia: Secondary | ICD-10-CM

## 2014-10-26 DIAGNOSIS — B353 Tinea pedis: Secondary | ICD-10-CM | POA: Diagnosis not present

## 2014-10-26 LAB — BASIC METABOLIC PANEL
BUN: 15 mg/dL (ref 6–23)
CHLORIDE: 100 meq/L (ref 96–112)
CO2: 32 mEq/L (ref 19–32)
Calcium: 9.7 mg/dL (ref 8.4–10.5)
Creatinine, Ser: 1.3 mg/dL (ref 0.40–1.50)
GFR: 60.97 mL/min (ref >60.00)
Glucose, Bld: 120 mg/dL — ABNORMAL HIGH (ref 70–99)
Potassium: 5.7 mEq/L — ABNORMAL HIGH (ref 3.5–5.1)
SODIUM: 135 meq/L (ref 135–145)

## 2014-10-26 LAB — HEMOGLOBIN A1C: HEMOGLOBIN A1C: 7 % — AB (ref 4.6–6.5)

## 2014-10-26 MED ORDER — CLOTRIMAZOLE-BETAMETHASONE 1-0.05 % EX CREA: 1.0000 "application " | TOPICAL_CREAM | Freq: Two times a day (BID) | CUTANEOUS | Status: DC

## 2014-10-26 MED ORDER — ROSUVASTATIN CALCIUM 20 MG PO TABS: ORAL_TABLET | ORAL | Status: DC

## 2014-10-26 NOTE — Progress Notes (Signed)
Pre visit review using our clinic review tool, if applicable. No additional management support is needed unless otherwise documented below in the visit note. 

## 2014-10-26 NOTE — Assessment & Plan Note (Signed)
Chronic problem.  Pt is alternating between me and Dr Loanne Drilling for his care.  Has eye exam scheduled for next week- pt instructed to have provider send me notes.  On ACE for renal protection.  Denies symptomatic lows.  Check labs.  Adjust meds prn.  Again encouraged pt to quit smoking as this poses a great risk to his health.  Pt is not ready or interested in quitting.

## 2014-10-26 NOTE — Assessment & Plan Note (Signed)
New.  Start Lotrisone cream topically.

## 2014-10-26 NOTE — Progress Notes (Signed)
   Subjective:    Patient ID: Zachary Levee., male    DOB: 01/23/1961, 54 y.o.   MRN: [id number]  HPI DM- chronic problem, currently on Bromocriptine, Amaryl, Invokana, Januvia, Metformin.  Has eye exam scheduled for next week.  On ACE for renal protection.  Pt has not checked CBGs since last visit.  Denies symptomatic lows.  No CP, SOB above baseline, HAs, visual changes, abd pain, N/V, numbness/tingling of hands/feet.  No sores on feet.  Foot fungus- pt's skin is dry, peeling, itching.  Review of Systems For ROS see HPI     Objective:   Physical Exam  Constitutional: He is oriented to person, place, and time. He appears well-developed and well-nourished. No distress.  HENT:  Head: Normocephalic and atraumatic.  Eyes: Conjunctivae and EOM are normal. Pupils are equal, round, and reactive to light.  Neck: Normal range of motion. Neck supple. No thyromegaly present.  Cardiovascular: Normal rate, regular rhythm, normal heart sounds and intact distal pulses.   No murmur heard. Pulmonary/Chest: Effort normal and breath sounds normal. No respiratory distress.  Abdominal: Soft. Bowel sounds are normal. He exhibits no distension.  Musculoskeletal: He exhibits no edema.  Lymphadenopathy:    He has no cervical adenopathy.  Neurological: He is alert and oriented to person, place, and time. No cranial nerve deficit.  Skin: Skin is warm and dry.  Tinea pedis bilaterally  Psychiatric: He has a normal mood and affect. His behavior is normal.  Vitals reviewed.         Assessment & Plan:

## 2014-10-26 NOTE — Patient Instructions (Signed)
Follow up in 3-4 months to recheck diabetes and cholesterol We'll notify you of your lab results and make any changes if needed Start the Lotrisone cream twice daily on your feet Use the coupon card to get your Crestor Call with any questions or concerns Happy [place]!

## 2014-10-27 ENCOUNTER — Other Ambulatory Visit: Payer: Self-pay | Admitting: Family Medicine

## 2014-10-27 DIAGNOSIS — E875 Hyperkalemia: Secondary | ICD-10-CM

## 2014-10-28 ENCOUNTER — Other Ambulatory Visit (INDEPENDENT_AMBULATORY_CARE_PROVIDER_SITE_OTHER): Payer: BLUE CROSS/BLUE SHIELD

## 2014-10-28 DIAGNOSIS — E875 Hyperkalemia: Secondary | ICD-10-CM

## 2014-10-28 LAB — BASIC METABOLIC PANEL
BUN: 16 mg/dL (ref 6–23)
CALCIUM: 9.5 mg/dL (ref 8.4–10.5)
CHLORIDE: 100 meq/L (ref 96–112)
CO2: 30 meq/L (ref 19–32)
Creatinine, Ser: 1.15 mg/dL (ref 0.40–1.50)
GFR: 70.24 mL/min (ref >60.00)
Glucose, Bld: 115 mg/dL — ABNORMAL HIGH (ref 70–99)
Potassium: 4.7 mEq/L (ref 3.5–5.1)
SODIUM: 137 meq/L (ref 135–145)

## 2014-11-07 LAB — HM DIABETES EYE EXAM

## 2014-11-08 ENCOUNTER — Encounter: Payer: Self-pay | Admitting: Internal Medicine

## 2014-11-23 ENCOUNTER — Encounter: Payer: Self-pay | Admitting: General Practice

## 2014-11-27 ENCOUNTER — Other Ambulatory Visit: Payer: Self-pay | Admitting: Endocrinology

## 2014-12-18 ENCOUNTER — Other Ambulatory Visit: Payer: Self-pay | Admitting: Endocrinology

## 2014-12-19 ENCOUNTER — Ambulatory Visit: Payer: Managed Care, Other (non HMO) | Admitting: Endocrinology

## 2015-02-14 ENCOUNTER — Encounter: Payer: Self-pay | Admitting: General Practice

## 2015-02-14 ENCOUNTER — Ambulatory Visit (INDEPENDENT_AMBULATORY_CARE_PROVIDER_SITE_OTHER): Payer: BLUE CROSS/BLUE SHIELD | Admitting: Family Medicine

## 2015-02-14 ENCOUNTER — Encounter: Payer: Self-pay | Admitting: Family Medicine

## 2015-02-14 VITALS — BP 122/80 | HR 68 | Temp 98.0°F | Resp 16 | Wt 210.5 lb

## 2015-02-14 DIAGNOSIS — I10 Essential (primary) hypertension: Secondary | ICD-10-CM | POA: Diagnosis not present

## 2015-02-14 DIAGNOSIS — E1169 Type 2 diabetes mellitus with other specified complication: Secondary | ICD-10-CM | POA: Diagnosis not present

## 2015-02-14 DIAGNOSIS — E1165 Type 2 diabetes mellitus with hyperglycemia: Secondary | ICD-10-CM

## 2015-02-14 DIAGNOSIS — IMO0002 Reserved for concepts with insufficient information to code with codable children: Secondary | ICD-10-CM

## 2015-02-14 DIAGNOSIS — E785 Hyperlipidemia, unspecified: Secondary | ICD-10-CM | POA: Diagnosis not present

## 2015-02-14 LAB — HEPATIC FUNCTION PANEL
ALT: 18 U/L (ref 0–53)
AST: 16 U/L (ref 0–37)
Albumin: 4.3 g/dL (ref 3.5–5.2)
Alkaline Phosphatase: 59 U/L (ref 39–117)
BILIRUBIN DIRECT: 0.3 mg/dL (ref 0.0–0.3)
BILIRUBIN TOTAL: 1.1 mg/dL (ref 0.2–1.2)
Total Protein: 6.7 g/dL (ref 6.0–8.3)

## 2015-02-14 LAB — LIPID PANEL
Cholesterol: 116 mg/dL (ref 0–200)
HDL: 28.5 mg/dL — ABNORMAL LOW (ref >39.00)
LDL CALC: 58 mg/dL (ref 0–99)
NONHDL: 87.78
Total CHOL/HDL Ratio: 4
Triglycerides: 147 mg/dL (ref 0.0–149.0)
VLDL: 29.4 mg/dL (ref 0.0–40.0)

## 2015-02-14 LAB — CBC WITH DIFFERENTIAL/PLATELET
BASOS PCT: 0.5 % (ref 0.0–3.0)
Basophils Absolute: 0 10*3/uL (ref 0.0–0.1)
EOS PCT: 2.4 % (ref 0.0–5.0)
Eosinophils Absolute: 0.2 10*3/uL (ref 0.0–0.7)
HEMATOCRIT: 50 % (ref 39.0–52.0)
Hemoglobin: 17.1 g/dL — ABNORMAL HIGH (ref 13.0–17.0)
LYMPHS PCT: 30.9 % (ref 12.0–46.0)
Lymphs Abs: 2.6 10*3/uL (ref 0.7–4.0)
MCHC: 34.2 g/dL (ref 30.0–36.0)
MCV: 88.3 fl (ref 78.0–100.0)
MONOS PCT: 8.9 % (ref 3.0–12.0)
Monocytes Absolute: 0.8 10*3/uL (ref 0.1–1.0)
NEUTROS ABS: 4.9 10*3/uL (ref 1.4–7.7)
Neutrophils Relative %: 57.3 % (ref 43.0–77.0)
PLATELETS: 193 10*3/uL (ref 150.0–400.0)
RBC: 5.67 Mil/uL (ref 4.22–5.81)
RDW: 13 % (ref 11.5–15.5)
WBC: 8.5 10*3/uL (ref 4.0–10.5)

## 2015-02-14 LAB — BASIC METABOLIC PANEL
BUN: 20 mg/dL (ref 6–23)
CALCIUM: 9.5 mg/dL (ref 8.4–10.5)
CO2: 29 mEq/L (ref 19–32)
CREATININE: 1.06 mg/dL (ref 0.40–1.50)
Chloride: 100 mEq/L (ref 96–112)
GFR: 77.08 mL/min (ref >60.00)
Glucose, Bld: 110 mg/dL — ABNORMAL HIGH (ref 70–99)
Potassium: 4.4 mEq/L (ref 3.5–5.1)
Sodium: 136 mEq/L (ref 135–145)

## 2015-02-14 LAB — HEMOGLOBIN A1C: Hgb A1c MFr Bld: 7.2 % — ABNORMAL HIGH (ref 4.6–6.5)

## 2015-02-14 NOTE — Progress Notes (Signed)
   Subjective:    Patient ID: Zachary Clay., male    DOB: Jul 03, 1960, 54 y.o.   MRN: [id number]  HPI DM- chronic problem, on Invokana, Metformin, Bromocriptine, Amaryl, Januvia.  UTD on eye exam, foot exam.  On ACE for renal protection.  Home CBG 112 this morning.  Intermittent symptomatic lows.  No CP, SOB above baseline, HAs, visual changes, edema.  No numbness/tingling of hands/feet.  Hyperlipidemia- chronic problem, on Crestor.  Denies abd pain, N/V, myalgias.  HTN- chronic problem, on Lisinopril.  Denies CP, SOB above baseline, HAs, visual changes, edema.   Review of Systems For ROS see HPI     Objective:   Physical Exam  Constitutional: He is oriented to person, place, and time. He appears well-developed and well-nourished. No distress.  HENT:  Head: Normocephalic and atraumatic.  Eyes: Conjunctivae and EOM are normal. Pupils are equal, round, and reactive to light.  Neck: Normal range of motion. Neck supple. No thyromegaly present.  Cardiovascular: Normal rate, regular rhythm, normal heart sounds and intact distal pulses.   No murmur heard. Pulmonary/Chest: Effort normal and breath sounds normal. No respiratory distress.  Abdominal: Soft. Bowel sounds are normal. He exhibits no distension.  Musculoskeletal: He exhibits no edema.  Lymphadenopathy:    He has no cervical adenopathy.  Neurological: He is alert and oriented to person, place, and time. No cranial nerve deficit.  Skin: Skin is warm and dry.  Psychiatric: He has a normal mood and affect. His behavior is normal.  Vitals reviewed.         Assessment & Plan:

## 2015-02-14 NOTE — Progress Notes (Signed)
Pre visit review using our clinic review tool, if applicable. No additional management support is needed unless otherwise documented below in the visit note. 

## 2015-02-14 NOTE — Assessment & Plan Note (Signed)
Chronic problem.  Tolerating statin w/o difficulty.  Check labs.  Adjust meds prn  

## 2015-02-14 NOTE — Assessment & Plan Note (Addendum)
Chronic problem.  Previously following w/ Endo but pt prefers to follow here at our office.  UTD on eye exam, foot exam.  On ACE for renal protection.  Home CBGs are well controlled by report.  Asymptomatic.  Declines Flu and Pneumonia vaccines.  Check labs.  Adjust meds prn

## 2015-02-14 NOTE — Patient Instructions (Signed)
Schedule your complete physical in 3-4 months We'll notify you of your lab results and make any changes if needed Keep up the good work on your diet!  Add some exercise!!! Call with any questions or concerns Happy [place] [facility]!!!

## 2015-02-14 NOTE — Assessment & Plan Note (Signed)
Chronic problem.  Well controlled on Lisinopril.  Asymptomatic.  Check labs.  No anticipated med changes. 

## 2015-02-28 ENCOUNTER — Other Ambulatory Visit: Payer: Self-pay | Admitting: Family Medicine

## 2015-02-28 NOTE — Telephone Encounter (Signed)
Medication filled to pharmacy as requested.   

## 2015-04-22 ENCOUNTER — Other Ambulatory Visit: Payer: Self-pay | Admitting: Endocrinology

## 2015-04-25 ENCOUNTER — Telehealth: Payer: Self-pay

## 2015-04-25 NOTE — Telephone Encounter (Signed)
Patients wife called stating patient needs the results of his last CPAP reading for work. I did not see this in chart please advise

## 2015-04-26 NOTE — Telephone Encounter (Signed)
Called pt wife and LMOVM to inform that results were found from 2010 (Dr. Janifer Adie office). This result was printed and placed at the front desk for pick up.

## 2015-04-30 ENCOUNTER — Other Ambulatory Visit: Payer: Self-pay | Admitting: Family Medicine

## 2015-05-01 NOTE — Telephone Encounter (Signed)
Medication filled to pharmacy as requested.   

## 2015-05-23 ENCOUNTER — Encounter: Payer: Self-pay | Admitting: Pulmonary Disease

## 2015-05-23 ENCOUNTER — Ambulatory Visit (INDEPENDENT_AMBULATORY_CARE_PROVIDER_SITE_OTHER): Payer: BLUE CROSS/BLUE SHIELD | Admitting: Pulmonary Disease

## 2015-05-23 VITALS — BP 122/78 | HR 66 | Ht 67.0 in | Wt 221.0 lb

## 2015-05-23 DIAGNOSIS — G4733 Obstructive sleep apnea (adult) (pediatric): Secondary | ICD-10-CM | POA: Diagnosis not present

## 2015-05-23 NOTE — Patient Instructions (Signed)
We'll try to obtain the results of this study and a CPAP. You will likely need a new machine and a new mask. We will try to arrange for that.  Return to clinic in 2 months.

## 2015-05-23 NOTE — Progress Notes (Signed)
   Subjective:    Patient ID: Zachary Clay., male    DOB: 06/28/1960, 54 y.o.   MRN: [id number]  HPI Follow-up for sleep apnea.  Zachary Clay is a 54 year old with history of severe sleep apnea. This was diagnosed with sleep study in 1998. He was maintained on CPAP since then. He had been tolerating it well but in 2009 complained that is it wasn't working efficiently. He saw Dr. Gwenette Greet who changed the CPAP. He has not followed up with [place] since 2009. He is likely on a  AutoSet CPAP as I didn't see any evidence that the CPAP titration was performed.  His been using the same CPAP for the past 6 years. He once again feels that it's not working efficiently. He states that his snoring is starting to increase in intensity. He does not get restful sleep at night and wakes up feeling fatigued. He has increased daytime somnolence. Apparently a chip study was done last month on his machine for the purpose of his driver's license approval.  Sleep study 05/10/97  Severe obstructive sleep apnea with significant oxygen desaturation. Heavy snoring.  Past Medical History  Diagnosis Date  . Diabetes mellitus   . Hyperlipidemia   . Polycythemia 2012    Hgb 17.8  . Personal history of colonic adenomas 02/16/2013   Review of Systems Excessive daytime sleepiness, snoring, witnessed apneas. Denies any dyspnea, wheezing, cough, sputum production. Denies any chest pain, palpitations. All other review of systems negative    Objective:   Physical Exam  Blood pressure 122/78, pulse 66, height 5\' 7"  (1.702 m), weight 221 lb (100.245 kg), SpO2 95 %.  Gen: No apparent distress Neuro: No gross focal deficits. Neck: No JVD, lymphadenopathy, thyromegaly. RS:  CVS: S1-S2 heard, no murmurs rubs gallops. Abdomen: Soft, positive bowel sounds. Extremities: No edema.    Assessment & Plan:  Severe obstructive sleep apnea.  He is being maintained on what looks like an AutoSet CPAP since 1998. His last machine is 55  years old. He feels that it is not working as efficiently now with increased somnolence and snoring. I suspect this is because his machine is very old. He apparently got a card download, chip study of his CPAP last month. We will try to track this down. If we cannot find this then we will go ahead and get him a new CPAP.  He also feels that the mask is uncomfortable with mask leaks. He will be fitted with a new mask.  Plan: - Obtain results of the card download study on CPAP - Assess if he needs a new CPAP machine - Mask fitting with a more comfortable new mask.  Return to clinic in 2 months  Marshell Garfinkel MD [facility] Pager [phone] If no answer or after 3pm call: [phone] 05/23/2015, 4:46 PM

## 2015-05-23 NOTE — Addendum Note (Signed)
Addended by: Parke Poisson E on: 05/23/2015 05:01 PM   Modules accepted: Orders

## 2015-05-30 ENCOUNTER — Other Ambulatory Visit: Payer: Self-pay | Admitting: Endocrinology

## 2015-07-24 ENCOUNTER — Other Ambulatory Visit: Payer: Self-pay | Admitting: Endocrinology

## 2015-07-25 ENCOUNTER — Ambulatory Visit (INDEPENDENT_AMBULATORY_CARE_PROVIDER_SITE_OTHER): Payer: BLUE CROSS/BLUE SHIELD | Admitting: Pulmonary Disease

## 2015-07-25 ENCOUNTER — Encounter: Payer: Self-pay | Admitting: Pulmonary Disease

## 2015-07-25 VITALS — BP 126/82 | HR 90 | Ht 67.0 in | Wt 216.2 lb

## 2015-07-25 DIAGNOSIS — G4733 Obstructive sleep apnea (adult) (pediatric): Secondary | ICD-10-CM | POA: Diagnosis not present

## 2015-07-25 DIAGNOSIS — F17210 Nicotine dependence, cigarettes, uncomplicated: Secondary | ICD-10-CM

## 2015-07-25 NOTE — Progress Notes (Signed)
   Subjective:    Patient ID: Zachary Clay., male    DOB: 1960/07/15, 55 y.o.   MRN: [id number]  HPI Follow-up for sleep apnea.  Zachary Clay is a 55 year old with history of severe sleep apnea. This was diagnosed with sleep study in 1998. He was maintained on CPAP since then. He had been tolerating it well but in 2009 complained that is it wasn't working efficiently. He saw Dr. Gwenette Clay who changed the CPAP. He has not followed up with [place] since 2009. He is likely on a  AutoSet CPAP as I didn't see any evidence that the CPAP titration was performed.  His been using the same CPAP for the past 6 years. He once again feels that it's not working efficiently. He states that his snoring is starting to increase in intensity. He does not get restful sleep at night and wakes up feeling fatigued. He has increased daytime somnolence. Apparently a chip study was done last month on his machine for the purpose of his driver's license approval.  Sleep study 05/10/97  Severe obstructive sleep apnea with significant oxygen desaturation. Heavy snoring.  Data download 07/24/15 AutoSet 3-13 median pressure 10 AHI 4 Percent used days greater than 4 hours: 90  Social history: 2 pack per day smoker for 20 years. Active smoker. Occasional alcohol use, no illegal drug use He works as a Administrator.  Family history: Father-coronary artery disease Mother-diabetes mellitus   Past Medical History  Diagnosis Date  . Diabetes mellitus   . Hyperlipidemia   . Polycythemia 2012    Hgb 17.8  . Personal history of colonic adenomas 02/16/2013   Review of Systems Excessive daytime sleepiness, snoring, witnessed apneas. Denies any dyspnea, wheezing, cough, sputum production. Denies any chest pain, palpitations. All other review of systems negative    Objective:   Physical Exam  Blood pressure 126/82, pulse 90, height 5\' 7"  (1.702 m), weight 216 lb 3.2 oz (98.068 kg), SpO2 92 %. Gen: No apparent distress Neuro:  No gross focal deficits. Neck: No JVD, lymphadenopathy, thyromegaly. RS:  CVS: S1-S2 heard, no murmurs rubs gallops. Abdomen: Soft, positive bowel sounds. Extremities: No edema.    Assessment & Plan:  1# Severe obstructive sleep apnea. He is being maintained on what looks like an AutoSet CPAP since 1998. His last machine is 56 years old. He feels that it is not working as efficiently now with increased somnolence and snoring. I suspect this is because his machine is very old. We have ordered him a new CPAP but apparently his DME company has not received it. We will resend that order. He also feels that the mask is uncomfortable with mask leaks. He will be fitted with a new mask.  2# Tobacco use. He is a heavy smoker. Smoking cessation advice. He is not interested in quitting. Total time spent in counseling 5 minutes.  Plan: - New CPAP machine - Mask fitting with a more comfortable new mask. - Smoking cessation   Return to clinic in 3 months  Zachary Garfinkel MD [facility] Pager [phone] If no answer or after 3pm call: [phone] 07/25/2015, 2:29 PM

## 2015-07-25 NOTE — Telephone Encounter (Signed)
Please advise if ok to refill. Last office visit was 08/09/2014.

## 2015-07-25 NOTE — Patient Instructions (Signed)
Order CPAP with mask fitting Return in 3 months.

## 2015-07-25 NOTE — Telephone Encounter (Signed)
Please refill x 1 Ov is due  

## 2015-07-31 NOTE — Addendum Note (Signed)
Addended by: Parke Poisson E on: 07/31/2015 09:32 AM   Modules accepted: Orders

## 2015-08-01 ENCOUNTER — Encounter: Payer: Self-pay | Admitting: Behavioral Health

## 2015-08-01 ENCOUNTER — Telehealth: Payer: Self-pay | Admitting: Behavioral Health

## 2015-08-01 NOTE — Telephone Encounter (Signed)
Pre-Visit Call completed with patient and chart updated.   Pre-Visit Info documented in Specialty Comments under [employer].    

## 2015-08-02 ENCOUNTER — Ambulatory Visit (INDEPENDENT_AMBULATORY_CARE_PROVIDER_SITE_OTHER): Payer: BLUE CROSS/BLUE SHIELD | Admitting: Family Medicine

## 2015-08-02 ENCOUNTER — Encounter: Payer: Self-pay | Admitting: Family Medicine

## 2015-08-02 VITALS — BP 124/76 | HR 76 | Temp 97.8°F | Ht 67.0 in | Wt 215.0 lb

## 2015-08-02 DIAGNOSIS — E1165 Type 2 diabetes mellitus with hyperglycemia: Secondary | ICD-10-CM | POA: Diagnosis not present

## 2015-08-02 DIAGNOSIS — IMO0002 Reserved for concepts with insufficient information to code with codable children: Secondary | ICD-10-CM

## 2015-08-02 DIAGNOSIS — E1121 Type 2 diabetes mellitus with diabetic nephropathy: Secondary | ICD-10-CM | POA: Diagnosis not present

## 2015-08-02 DIAGNOSIS — Z0000 Encounter for general adult medical examination without abnormal findings: Secondary | ICD-10-CM | POA: Diagnosis not present

## 2015-08-02 LAB — HEPATIC FUNCTION PANEL
ALT: 16 U/L (ref 0–53)
AST: 14 U/L (ref 0–37)
Albumin: 4 g/dL (ref 3.5–5.2)
Alkaline Phosphatase: 72 U/L (ref 39–117)
Bilirubin, Direct: 0.1 mg/dL (ref 0.0–0.3)
Total Bilirubin: 0.8 mg/dL (ref 0.2–1.2)
Total Protein: 6.3 g/dL (ref 6.0–8.3)

## 2015-08-02 LAB — BASIC METABOLIC PANEL WITH GFR
BUN: 14 mg/dL (ref 6–23)
CO2: 30 meq/L (ref 19–32)
Calcium: 9.3 mg/dL (ref 8.4–10.5)
Chloride: 97 meq/L (ref 96–112)
Creatinine, Ser: 1.03 mg/dL (ref 0.40–1.50)
GFR: 79.54 mL/min
Glucose, Bld: 275 mg/dL — ABNORMAL HIGH (ref 70–99)
Potassium: 4.8 meq/L (ref 3.5–5.1)
Sodium: 133 meq/L — ABNORMAL LOW (ref 135–145)

## 2015-08-02 LAB — CBC WITH DIFFERENTIAL/PLATELET
Basophils Absolute: 0 10*3/uL (ref 0.0–0.1)
Basophils Relative: 0.4 % (ref 0.0–3.0)
Eosinophils Absolute: 0.2 10*3/uL (ref 0.0–0.7)
Eosinophils Relative: 2.3 % (ref 0.0–5.0)
HCT: 48.6 % (ref 39.0–52.0)
Hemoglobin: 16.6 g/dL (ref 13.0–17.0)
Lymphocytes Relative: 25.5 % (ref 12.0–46.0)
Lymphs Abs: 2.4 10*3/uL (ref 0.7–4.0)
MCHC: 34.2 g/dL (ref 30.0–36.0)
MCV: 85.6 fl (ref 78.0–100.0)
Monocytes Absolute: 0.8 10*3/uL (ref 0.1–1.0)
Monocytes Relative: 8.8 % (ref 3.0–12.0)
Neutro Abs: 5.8 10*3/uL (ref 1.4–7.7)
Neutrophils Relative %: 63 % (ref 43.0–77.0)
Platelets: 200 10*3/uL (ref 150.0–400.0)
RBC: 5.68 Mil/uL (ref 4.22–5.81)
RDW: 12.6 % (ref 11.5–15.5)
WBC: 9.2 10*3/uL (ref 4.0–10.5)

## 2015-08-02 LAB — LIPID PANEL
CHOLESTEROL: 132 mg/dL (ref 0–200)
HDL: 30.3 mg/dL — AB (ref >39.00)
LDL Cholesterol: 67 mg/dL (ref 0–99)
NonHDL: 101.3
TRIGLYCERIDES: 172 mg/dL — AB (ref 0.0–149.0)
Total CHOL/HDL Ratio: 4
VLDL: 34.4 mg/dL (ref 0.0–40.0)

## 2015-08-02 LAB — HEMOGLOBIN A1C: Hgb A1c MFr Bld: 11.2 % — ABNORMAL HIGH (ref 4.6–6.5)

## 2015-08-02 LAB — PSA: PSA: 1.12 ng/mL (ref 0.10–4.00)

## 2015-08-02 LAB — TSH: TSH: 1.86 u[IU]/mL (ref 0.35–4.50)

## 2015-08-02 NOTE — Progress Notes (Signed)
   Subjective:    Patient ID: Zachary Clay., male    DOB: May 02, 1961, 55 y.o.   MRN: [id number]  HPI CPE- UTD on colonoscopy (due 2019).  UTD on foot exam, eye exam.  On ACE for renal protection.  Refused flu shot.     Review of Systems Patient reports no vision/hearing changes, anorexia, fever ,adenopathy, persistant/recurrent hoarseness, swallowing issues, chest pain, palpitations, edema, hemoptysis, dyspnea (rest,exertional, paroxysmal nocturnal), gastrointestinal  bleeding (melena, rectal bleeding), abdominal pain, excessive heart burn, GU symptoms (dysuria, hematuria, voiding/incontinence issues) syncope, focal weakness, memory loss, numbness & tingling, skin/hair/nail changes, depression, anxiety, abnormal bruising/bleeding, musculoskeletal symptoms/signs.   + chronic cough    Objective:   Physical Exam BP 124/76 mmHg  Pulse 76  Temp(Src) 97.8 F (36.6 C) (Oral)  Ht 5\' 7"  (1.702 m)  Wt 215 lb (97.523 kg)  BMI 33.67 kg/m2  SpO2 96%  General Appearance:    Alert, cooperative, no distress, appears stated age  Head:    Normocephalic, without obvious abnormality, atraumatic  Eyes:    PERRL, conjunctiva/corneas clear, EOM's intact, fundi    benign, both eyes       Ears:    Normal TM's and external ear canals, both ears  Nose:   Nares normal, septum midline, mucosa normal, no drainage   or sinus tenderness  Throat:   Lips, mucosa, and tongue normal; teeth and gums normal  Neck:   Supple, symmetrical, trachea midline, no adenopathy;       thyroid:  No enlargement/tenderness/nodules; no carotid  Back:     Symmetric, no curvature, ROM normal, no CVA tenderness  Lungs:     Clear to auscultation bilaterally, respirations unlabored  Chest wall:    No tenderness or deformity  Heart:    Regular rate and rhythm, S1 and S2 normal, no murmur, rub   or gallop  Abdomen:     Soft, non-tender, bowel sounds active all four quadrants,    no masses, no organomegaly  Genitalia:    Normal male  without lesion, discharge or tenderness  Rectal:    Normal tone, normal prostate, no masses or tenderness  Extremities:   Extremities normal, atraumatic, no cyanosis or edema  Pulses:   2+ and symmetric all extremities  Skin:   Skin color, texture, turgor normal, no rashes or lesions  Lymph nodes:   Cervical, supraclavicular, and axillary nodes normal  Neurologic:   CNII-XII intact. Normal strength, sensation and reflexes      throughout          Assessment & Plan:

## 2015-08-02 NOTE — Progress Notes (Signed)
Pre visit review using our clinic review tool, if applicable. No additional management support is needed unless otherwise documented below in the visit note. 

## 2015-08-02 NOTE — Patient Instructions (Signed)
Follow up in 3-4 months to recheck diabetes We'll notify you of your lab results and make any changes if needed Continue to work on healthy diet and regular exercise- you can do it! Call Pulmonary and tell them you need them to send the order for the CPAP Call with any questions or concerns If you want to join [place] at the new [place] office, any scheduled appointments will automatically transfer and we will see you at [address] [place] [address] Zachary Clay, [place] [address] [facility]) Happy Valentine's Day!!!

## 2015-08-04 ENCOUNTER — Other Ambulatory Visit: Payer: Self-pay | Admitting: General Practice

## 2015-08-04 MED ORDER — CANAGLIFLOZIN 300 MG PO TABS: 300.0000 mg | ORAL_TABLET | Freq: Every day | ORAL | Status: DC

## 2015-08-04 MED ORDER — LISINOPRIL 20 MG PO TABS: ORAL_TABLET | ORAL | Status: DC

## 2015-08-04 MED ORDER — ROSUVASTATIN CALCIUM 20 MG PO TABS: 20.0000 mg | ORAL_TABLET | Freq: Every day | ORAL | Status: DC

## 2015-08-07 ENCOUNTER — Telehealth: Payer: Self-pay | Admitting: *Deleted

## 2015-08-07 NOTE — Telephone Encounter (Signed)
Received fax from pharmacy stating that Anastasio Auerbach is not covered by [employer], the preferred alternatives are [place] or Jardiance; request new Rx/SLS 02/13

## 2015-08-07 NOTE — Telephone Encounter (Signed)
Ok for jardiance 10mg  daily, #30, 6 refills

## 2015-08-08 ENCOUNTER — Other Ambulatory Visit: Payer: Self-pay | Admitting: General Practice

## 2015-08-08 MED ORDER — EMPAGLIFLOZIN 10 MG PO TABS: 10.0000 mg | ORAL_TABLET | Freq: Every day | ORAL | Status: DC

## 2015-08-08 NOTE — Telephone Encounter (Signed)
Pt advised of the medication change, agreed to therapy and med filled to local pharmacy.

## 2015-08-15 ENCOUNTER — Other Ambulatory Visit: Payer: Self-pay | Admitting: Endocrinology

## 2015-08-20 NOTE — Assessment & Plan Note (Signed)
Pt's PE WNL.  Declines flu shot.  UTD on colonoscopy.  Check labs.  Anticipatory guidance provided.

## 2015-08-20 NOTE — Assessment & Plan Note (Signed)
Chronic problem.  Pt is no longer seeing Endo.  UTD on eye exam, foot exam.  On ACE for renal protection.  Check labs.  Adjust meds prn

## 2015-09-15 IMAGING — DX DG CHEST 2V
2 series · 2 of 2 positions shown · non-contrast
Comparison: PA and lateral chest 06/02/2009.

CLINICAL DATA: Cough.  Smoker.

EXAM:
CHEST  2 VIEW

[chest pa]
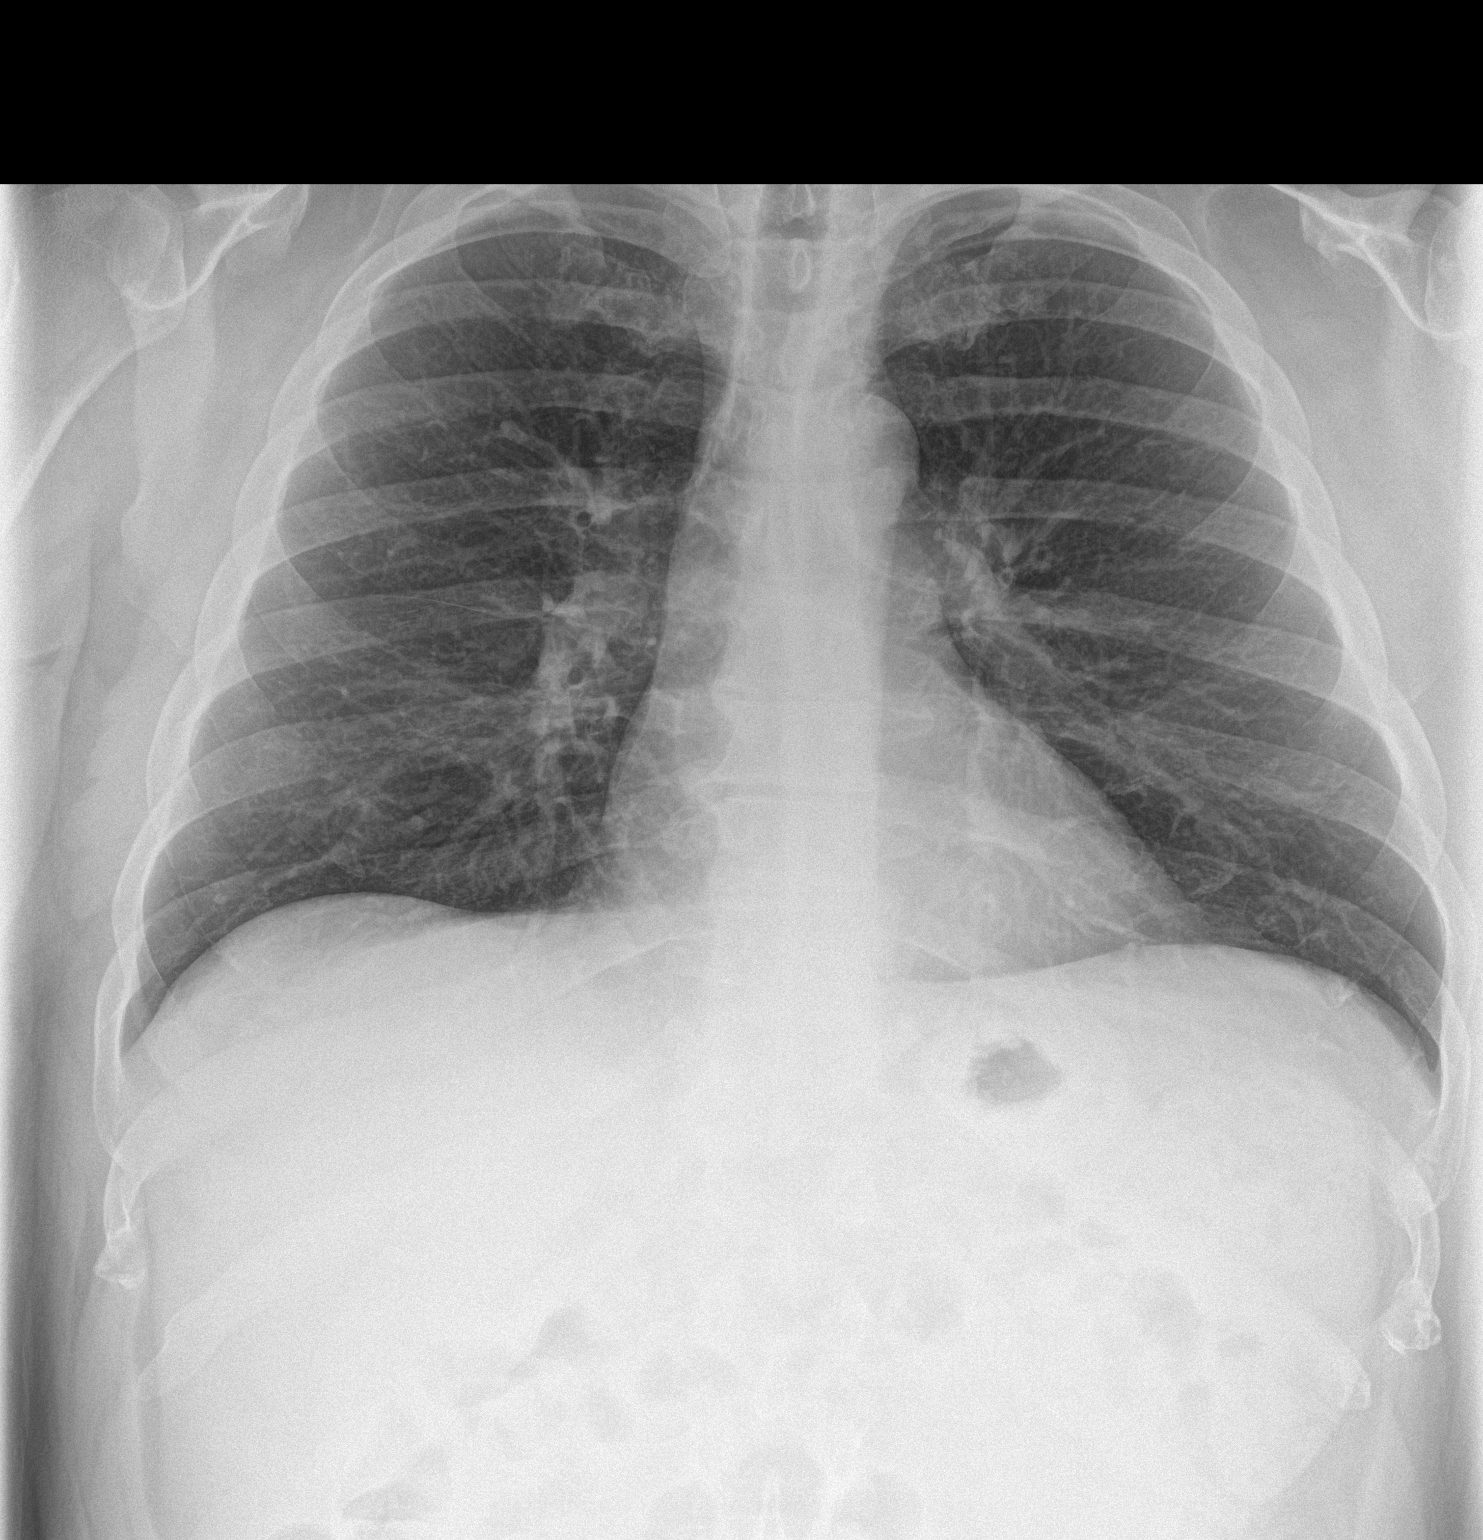

[chest lat]
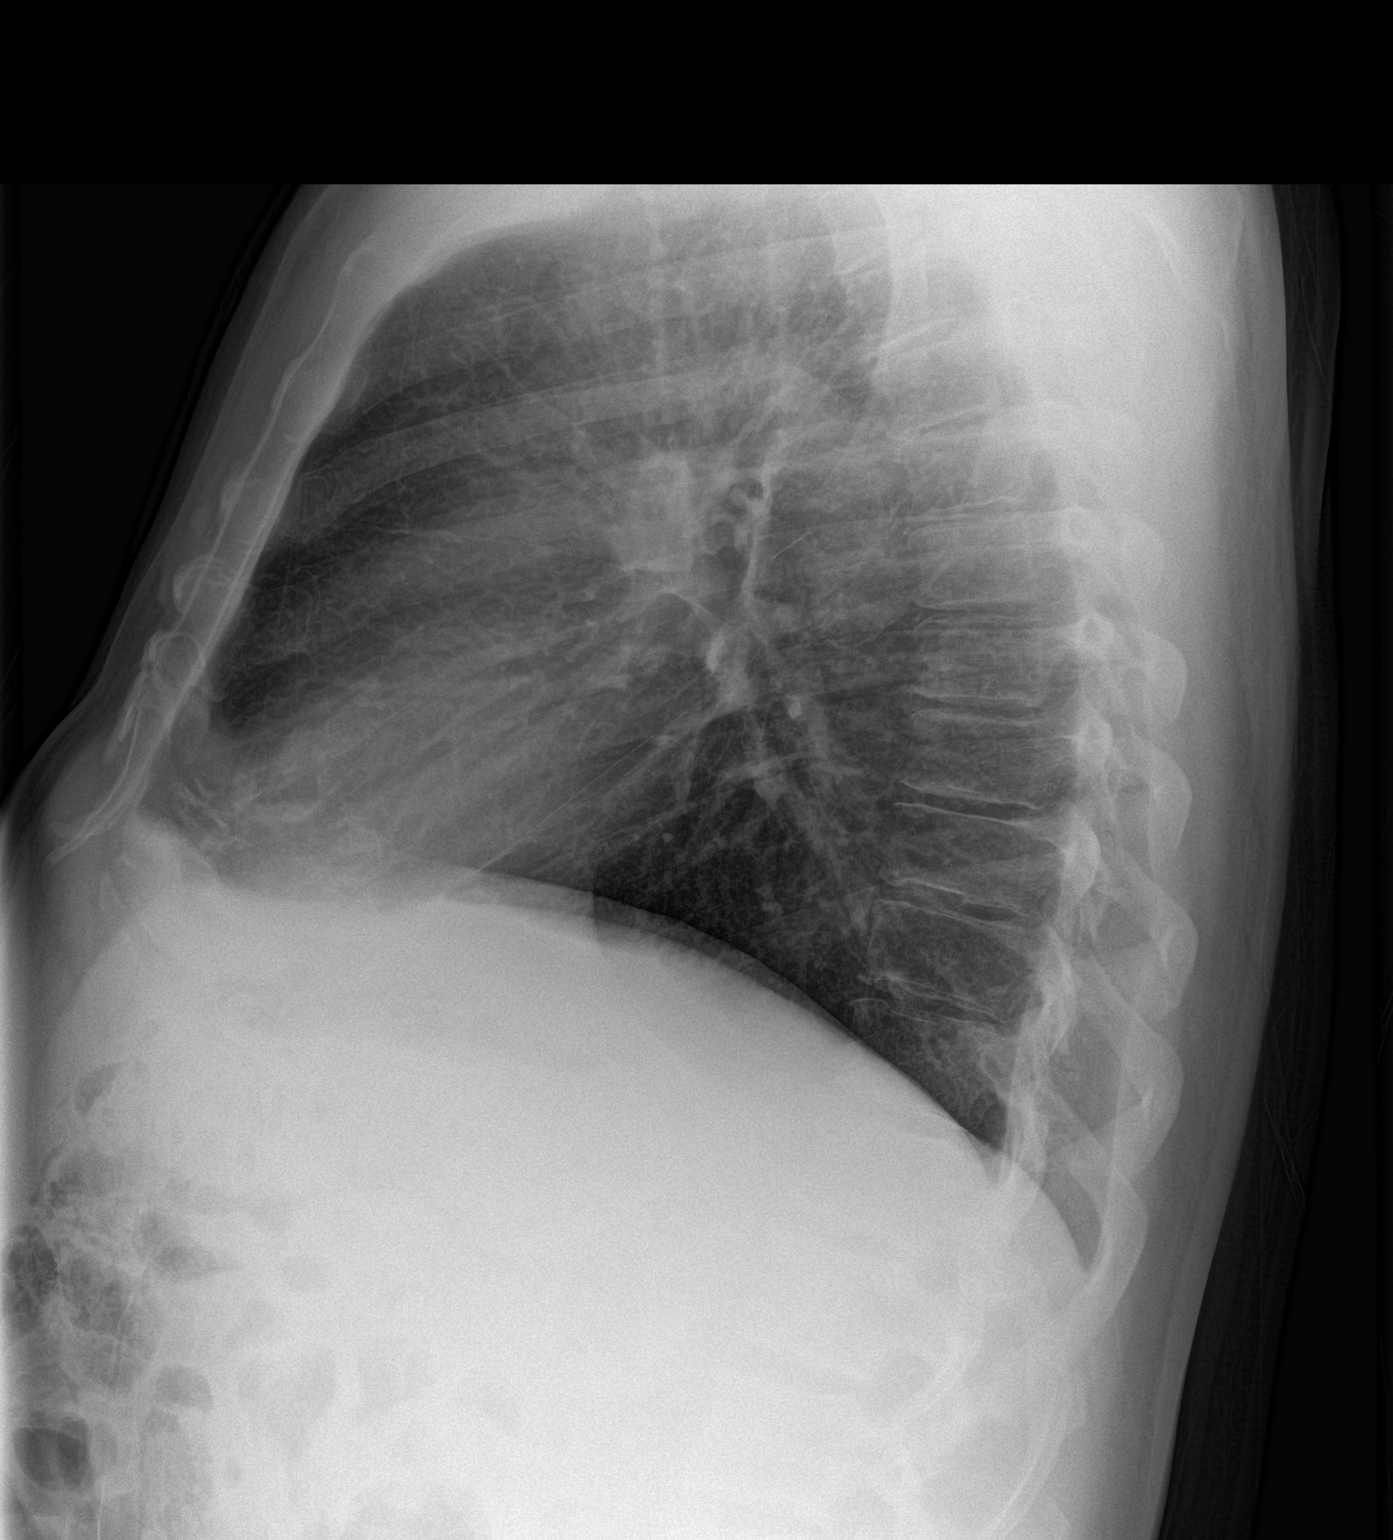

[2 of 2 positions shown; findings below may reference images not displayed]

FINDINGS: The lungs are clear. Cardiac and mediastinal contours appear normal.
There is no pneumothorax or pleural effusion. No focal bony
abnormality is identified.
IMPRESSION: No acute disease.

## 2015-09-22 ENCOUNTER — Other Ambulatory Visit: Payer: Self-pay

## 2015-09-22 MED ORDER — ESOMEPRAZOLE MAGNESIUM 40 MG PO CPDR: DELAYED_RELEASE_CAPSULE | ORAL | Status: DC

## 2015-09-26 ENCOUNTER — Ambulatory Visit (INDEPENDENT_AMBULATORY_CARE_PROVIDER_SITE_OTHER): Payer: BLUE CROSS/BLUE SHIELD | Admitting: Pulmonary Disease

## 2015-09-26 ENCOUNTER — Other Ambulatory Visit: Payer: Self-pay | Admitting: General Practice

## 2015-09-26 ENCOUNTER — Encounter: Payer: Self-pay | Admitting: Pulmonary Disease

## 2015-09-26 VITALS — BP 142/86 | HR 65 | Ht 67.0 in | Wt 224.0 lb

## 2015-09-26 DIAGNOSIS — G4733 Obstructive sleep apnea (adult) (pediatric): Secondary | ICD-10-CM | POA: Diagnosis not present

## 2015-09-26 DIAGNOSIS — F17200 Nicotine dependence, unspecified, uncomplicated: Secondary | ICD-10-CM | POA: Diagnosis not present

## 2015-09-26 MED ORDER — ESOMEPRAZOLE MAGNESIUM 40 MG PO CPDR: DELAYED_RELEASE_CAPSULE | ORAL | Status: DC

## 2015-09-26 NOTE — Progress Notes (Signed)
   Subjective:    Patient ID: Zachary Levee., male    DOB: 1961-05-23, 55 y.o.   MRN: [id number]  HPI Follow-up for sleep apnea.  Zachary Clay is a 55 year old with history of severe sleep apnea. This was diagnosed with sleep study in 1998. He was maintained on CPAP since then. He had been tolerating it well but in 2009 complained that is it wasn't working efficiently. He saw Dr. Gwenette Greet who changed the CPAP. He has not followed up with [place] since 2009. He is likely on a  AutoSet CPAP as I didn't see any evidence that the CPAP titration was performed.  He got a new CPAP machine in January 2017. He is using it up to 7 hours per day without any issues. He feels rested with no daytime somnolence, fatigue.  DATA: Sleep study 05/10/97  Severe obstructive sleep apnea with significant oxygen desaturation. Heavy snoring.  Data download 07/24/15 AutoSet 3-13 median pressure 10 AHI 4 Percent used days greater than 4 hours: 90  Social history: 2 pack per day smoker for 20 years. Active smoker. Occasional alcohol use, no illegal drug use He works as a Administrator.  Family history: Father-coronary artery disease Mother-diabetes mellitus   Past Medical History  Diagnosis Date  . Diabetes mellitus   . Hyperlipidemia   . Polycythemia 2012    Hgb 17.8  . Personal history of colonic adenomas 02/16/2013   Review of Systems Excessive daytime sleepiness, snoring, witnessed apneas. Denies any dyspnea, wheezing, cough, sputum production. Denies any chest pain, palpitations. All other review of systems negative    Objective:   Physical Exam Blood pressure 142/86, pulse 65, height 5\' 7"  (1.702 m), weight 224 lb (101.606 kg), SpO2 93 %. Gen: No apparent distress Neuro: No gross focal deficits. Neck: No JVD, lymphadenopathy, thyromegaly. RS:  CVS: S1-S2 heard, no murmurs rubs gallops. Abdomen: Soft, positive bowel sounds. Extremities: No edema.    Assessment & Plan:  1# Severe obstructive  sleep apnea. He is being maintained on what looks like an AutoSet CPAP since 1998. Is doing well with a new CPAP machine with no issues. He'll return to clinic in 6 months and will review his chip download at that time.  2# Tobacco use. He is a heavy smoker. Smoking cessation advice. He is not interested in quitting. Total time spent in counseling 5 minutes.  Plan: - Continue CPAP.  - Smoking cessation   Return to clinic in 6 months  Marshell Garfinkel MD [facility] Pager [phone] If no answer or after 3pm call: [phone] 09/26/2015, 9:38 AM

## 2015-09-26 NOTE — Addendum Note (Signed)
Addended by: Wynn Banker H on: 09/26/2015 10:16 AM   Modules accepted: Orders

## 2015-09-26 NOTE — Patient Instructions (Signed)
Continue using your CPAP Return to clinic in 6 months 

## 2015-10-14 DIAGNOSIS — G4733 Obstructive sleep apnea (adult) (pediatric): Secondary | ICD-10-CM | POA: Diagnosis not present

## 2015-10-18 ENCOUNTER — Other Ambulatory Visit: Payer: Self-pay | Admitting: General Practice

## 2015-10-18 ENCOUNTER — Other Ambulatory Visit: Payer: Self-pay | Admitting: Endocrinology

## 2015-10-18 MED ORDER — LISINOPRIL 20 MG PO TABS: ORAL_TABLET | ORAL | Status: DC

## 2015-10-18 NOTE — Telephone Encounter (Signed)
Please advise if ok to refill. Last office visit was 08/09/14. Thanks!

## 2015-10-18 NOTE — Telephone Encounter (Signed)
Please refill x 1 Ov is due  

## 2015-10-18 NOTE — Telephone Encounter (Signed)
Rx refilled and appointment letter mailed to the pt.

## 2015-11-13 DIAGNOSIS — G4733 Obstructive sleep apnea (adult) (pediatric): Secondary | ICD-10-CM | POA: Diagnosis not present

## 2015-11-27 ENCOUNTER — Encounter: Payer: Self-pay | Admitting: Family Medicine

## 2015-11-27 ENCOUNTER — Ambulatory Visit (INDEPENDENT_AMBULATORY_CARE_PROVIDER_SITE_OTHER): Payer: BLUE CROSS/BLUE SHIELD | Admitting: Family Medicine

## 2015-11-27 ENCOUNTER — Other Ambulatory Visit (INDEPENDENT_AMBULATORY_CARE_PROVIDER_SITE_OTHER): Payer: BLUE CROSS/BLUE SHIELD

## 2015-11-27 ENCOUNTER — Other Ambulatory Visit: Payer: Self-pay | Admitting: Endocrinology

## 2015-11-27 VITALS — BP 128/86 | HR 69 | Temp 98.3°F | Resp 16 | Ht 67.0 in | Wt 213.0 lb

## 2015-11-27 DIAGNOSIS — E1165 Type 2 diabetes mellitus with hyperglycemia: Secondary | ICD-10-CM

## 2015-11-27 DIAGNOSIS — E1169 Type 2 diabetes mellitus with other specified complication: Secondary | ICD-10-CM | POA: Diagnosis not present

## 2015-11-27 DIAGNOSIS — E1121 Type 2 diabetes mellitus with diabetic nephropathy: Secondary | ICD-10-CM | POA: Diagnosis not present

## 2015-11-27 DIAGNOSIS — IMO0002 Reserved for concepts with insufficient information to code with codable children: Secondary | ICD-10-CM

## 2015-11-27 DIAGNOSIS — N521 Erectile dysfunction due to diseases classified elsewhere: Secondary | ICD-10-CM | POA: Diagnosis not present

## 2015-11-27 LAB — BASIC METABOLIC PANEL
BUN: 14 mg/dL (ref 6–23)
CHLORIDE: 102 meq/L (ref 96–112)
CO2: 29 mEq/L (ref 19–32)
CREATININE: 0.98 mg/dL (ref 0.40–1.50)
Calcium: 9.1 mg/dL (ref 8.4–10.5)
GFR: 84.14 mL/min (ref >60.00)
Glucose, Bld: 218 mg/dL — ABNORMAL HIGH (ref 70–99)
Potassium: 4.7 mEq/L (ref 3.5–5.1)
Sodium: 135 mEq/L (ref 135–145)

## 2015-11-27 LAB — HEMOGLOBIN A1C: HEMOGLOBIN A1C: 8.4 % — AB (ref 4.6–6.5)

## 2015-11-27 MED ORDER — SILDENAFIL CITRATE 100 MG PO TABS: 100.0000 mg | ORAL_TABLET | Freq: Every day | ORAL | Status: DC | PRN

## 2015-11-27 NOTE — Progress Notes (Signed)
   Subjective:    Patient ID: Zachary Levee., male    DOB: 1961-05-21, 55 y.o.   MRN: [id number]  HPI DM- chronic problem, hx of poor control.  Due for eye exam (at [facility]), foot exam.  On ACE for renal protection.  On Jardiance, Amaryl, Januvia, Metformin.  Pt has lost 10 lbs since last visit.  Not checking CBGs recently.  Denies symptomatic lows.  No CP, SOB, HAs, visual changes, abd pain, N/V, numbness/tingling of hands/feet.  ED- pt reports it takes much longer for him to get an erection.  Denies increased stress.  Asking about medication effects.     Review of Systems For ROS see HPI     Objective:   Physical Exam  Constitutional: He is oriented to person, place, and time. He appears well-developed and well-nourished. No distress.  HENT:  Head: Normocephalic and atraumatic.  Eyes: Conjunctivae and EOM are normal. Pupils are equal, round, and reactive to light.  Neck: Normal range of motion. Neck supple. No thyromegaly present.  Cardiovascular: Normal rate, regular rhythm, normal heart sounds and intact distal pulses.   No murmur heard. Pulmonary/Chest: Effort normal and breath sounds normal. No respiratory distress.  Abdominal: Soft. Bowel sounds are normal. He exhibits no distension.  Musculoskeletal: He exhibits no edema.  Lymphadenopathy:    He has no cervical adenopathy.  Neurological: He is alert and oriented to person, place, and time. No cranial nerve deficit.  Skin: Skin is warm and dry.  Psychiatric: He has a normal mood and affect. His behavior is normal.  Vitals reviewed.         Assessment & Plan:

## 2015-11-27 NOTE — Progress Notes (Signed)
Pre visit review using our clinic review tool, if applicable. No additional management support is needed unless otherwise documented below in the visit note. 

## 2015-11-27 NOTE — Patient Instructions (Addendum)
Follow up for diabetes and cholesterol in 3-4 months Go to [facility] office and get your labs done Continue to work on healthy diet and regular exercise- you're doing great!!! Call and schedule your eye exam w/ [facility] Take the Viagra 30-60 minutes prior to sexual activity- will last up to 24 hrs.  Start w/ 1/2 tab and increase to 1 tab if needed Call with any questions or concerns Have a great summer!!!

## 2015-11-28 NOTE — Assessment & Plan Note (Signed)
Chronic problem.  Pt has lost weight and reports he is trying to eat better.  Taking his medications regularly.  Due for eye exam- referral placed.  Foot exam done today.  On ACE for renal protection.  Check labs.  Adjust meds prn

## 2015-11-28 NOTE — Assessment & Plan Note (Signed)
New.  Explained w/ pt that uncontrolled sugars and long term diabetes can cause this.  Stressed the need for healthy diet and regular exercise.  Start trial of Viagra and monitor for improvement.  Pt expressed understanding and is in agreement w/ plan.

## 2015-12-01 ENCOUNTER — Encounter: Payer: Self-pay | Admitting: General Practice

## 2015-12-14 DIAGNOSIS — G4733 Obstructive sleep apnea (adult) (pediatric): Secondary | ICD-10-CM | POA: Diagnosis not present

## 2015-12-20 ENCOUNTER — Telehealth: Payer: Self-pay | Admitting: Family Medicine

## 2015-12-20 NOTE — Telephone Encounter (Signed)
Please advise, it says all medications and a good percentage were previously filled by Dr. Loanne Drilling. Are we taking these over?

## 2015-12-20 NOTE — Telephone Encounter (Signed)
Pt states that he needs refill on all meds, [facility] [place]

## 2015-12-21 MED ORDER — CLOTRIMAZOLE-BETAMETHASONE 1-0.05 % EX CREA: 1.0000 "application " | TOPICAL_CREAM | Freq: Two times a day (BID) | CUTANEOUS | Status: DC

## 2015-12-21 MED ORDER — GLUCOSE BLOOD VI STRP: ORAL_STRIP | Status: DC

## 2015-12-21 MED ORDER — GLIMEPIRIDE 2 MG PO TABS: ORAL_TABLET | ORAL | Status: DC

## 2015-12-21 MED ORDER — SITAGLIPTIN PHOSPHATE 100 MG PO TABS: ORAL_TABLET | ORAL | Status: DC

## 2015-12-21 MED ORDER — SILDENAFIL CITRATE 100 MG PO TABS: 100.0000 mg | ORAL_TABLET | Freq: Every day | ORAL | Status: DC | PRN

## 2015-12-21 MED ORDER — ESOMEPRAZOLE MAGNESIUM 40 MG PO CPDR: DELAYED_RELEASE_CAPSULE | ORAL | Status: DC

## 2015-12-21 MED ORDER — ROSUVASTATIN CALCIUM 20 MG PO TABS
20.0000 mg | ORAL_TABLET | Freq: Every day | ORAL | Status: DC
Start: 2015-12-21 — End: 2016-08-23

## 2015-12-21 MED ORDER — EMPAGLIFLOZIN 10 MG PO TABS: 10.0000 mg | ORAL_TABLET | Freq: Every day | ORAL | Status: DC

## 2015-12-21 MED ORDER — METFORMIN HCL 1000 MG PO TABS: ORAL_TABLET | ORAL | Status: DC

## 2015-12-21 MED ORDER — LISINOPRIL 20 MG PO TABS: ORAL_TABLET | ORAL | Status: DC

## 2015-12-21 NOTE — Telephone Encounter (Signed)
Medication filled to pharmacy as requested.   

## 2015-12-21 NOTE — Telephone Encounter (Signed)
Ok to refill all medication

## 2015-12-21 NOTE — Telephone Encounter (Signed)
Pt needs all meds called in to pharmacy, even the ones that Dr Loanne Drilling use to fill. Pt also is asking for a refill on the cream and test strips.

## 2016-01-13 DIAGNOSIS — G4733 Obstructive sleep apnea (adult) (pediatric): Secondary | ICD-10-CM | POA: Diagnosis not present

## 2016-02-13 DIAGNOSIS — G4733 Obstructive sleep apnea (adult) (pediatric): Secondary | ICD-10-CM | POA: Diagnosis not present

## 2016-03-06 ENCOUNTER — Ambulatory Visit: Payer: BLUE CROSS/BLUE SHIELD | Admitting: Family Medicine

## 2016-03-06 ENCOUNTER — Encounter: Payer: Self-pay | Admitting: Family Medicine

## 2016-03-06 DIAGNOSIS — Z0289 Encounter for other administrative examinations: Secondary | ICD-10-CM

## 2016-04-10 ENCOUNTER — Ambulatory Visit: Payer: BLUE CROSS/BLUE SHIELD | Admitting: Pulmonary Disease

## 2016-05-28 DIAGNOSIS — G4733 Obstructive sleep apnea (adult) (pediatric): Secondary | ICD-10-CM | POA: Diagnosis not present

## 2016-06-28 ENCOUNTER — Other Ambulatory Visit: Payer: Self-pay | Admitting: Family Medicine

## 2016-08-13 ENCOUNTER — Telehealth: Payer: Self-pay | Admitting: *Deleted

## 2016-08-13 DIAGNOSIS — E1165 Type 2 diabetes mellitus with hyperglycemia: Principal | ICD-10-CM

## 2016-08-13 DIAGNOSIS — IMO0001 Reserved for inherently not codable concepts without codable children: Secondary | ICD-10-CM

## 2016-08-13 NOTE — Telephone Encounter (Signed)
Received a note from the pharmacy that patient's Vania Rea is no longer covered through insurance.    They will cover Invokana 100mg  or Farxiga 5mg .  Which medication would you like to do for patient?

## 2016-08-14 NOTE — Telephone Encounter (Signed)
Ok for [employer] 5mg  daily #30 only b/c pt is well overdue for a f/u appt

## 2016-08-15 MED ORDER — DAPAGLIFLOZIN PROPANEDIOL 5 MG PO TABS: 5.0000 mg | ORAL_TABLET | Freq: Every day | ORAL | 0 refills | Status: DC

## 2016-08-15 NOTE — Addendum Note (Signed)
Addended by: Katina Dung on: 08/15/2016 12:59 PM   Modules accepted: Orders

## 2016-08-15 NOTE — Telephone Encounter (Signed)
Patient informed of information - stated verbal understanding and scheduled a follow-up appointment with Tabori.   Medication was called in to pharmacy.

## 2016-08-16 DIAGNOSIS — Z13220 Encounter for screening for lipoid disorders: Secondary | ICD-10-CM | POA: Diagnosis not present

## 2016-08-16 DIAGNOSIS — Z136 Encounter for screening for cardiovascular disorders: Secondary | ICD-10-CM | POA: Diagnosis not present

## 2016-08-16 DIAGNOSIS — Z6833 Body mass index (BMI) 33.0-33.9, adult: Secondary | ICD-10-CM | POA: Diagnosis not present

## 2016-08-16 DIAGNOSIS — Z713 Dietary counseling and surveillance: Secondary | ICD-10-CM | POA: Diagnosis not present

## 2016-08-20 ENCOUNTER — Encounter: Payer: Self-pay | Admitting: Family Medicine

## 2016-08-20 ENCOUNTER — Ambulatory Visit (INDEPENDENT_AMBULATORY_CARE_PROVIDER_SITE_OTHER): Payer: BLUE CROSS/BLUE SHIELD | Admitting: Family Medicine

## 2016-08-20 VITALS — BP 128/78 | HR 80 | Temp 98.1°F | Resp 17 | Ht 67.0 in | Wt 217.4 lb

## 2016-08-20 DIAGNOSIS — I10 Essential (primary) hypertension: Secondary | ICD-10-CM | POA: Diagnosis not present

## 2016-08-20 DIAGNOSIS — E785 Hyperlipidemia, unspecified: Secondary | ICD-10-CM | POA: Diagnosis not present

## 2016-08-20 DIAGNOSIS — E1165 Type 2 diabetes mellitus with hyperglycemia: Secondary | ICD-10-CM

## 2016-08-20 DIAGNOSIS — E1121 Type 2 diabetes mellitus with diabetic nephropathy: Secondary | ICD-10-CM

## 2016-08-20 DIAGNOSIS — IMO0002 Reserved for concepts with insufficient information to code with codable children: Secondary | ICD-10-CM

## 2016-08-20 DIAGNOSIS — E1169 Type 2 diabetes mellitus with other specified complication: Secondary | ICD-10-CM

## 2016-08-20 LAB — LIPID PANEL
Cholesterol: 142 mg/dL (ref 0–200)
HDL: 31.9 mg/dL — ABNORMAL LOW (ref >39.00)
NONHDL: 110.4
TRIGLYCERIDES: 300 mg/dL — AB (ref 0.0–149.0)
Total CHOL/HDL Ratio: 4
VLDL: 60 mg/dL — ABNORMAL HIGH (ref 0.0–40.0)

## 2016-08-20 LAB — CBC WITH DIFFERENTIAL/PLATELET
BASOS ABS: 0.1 10*3/uL (ref 0.0–0.1)
Basophils Relative: 0.9 % (ref 0.0–3.0)
EOS ABS: 0.2 10*3/uL (ref 0.0–0.7)
Eosinophils Relative: 2.8 % (ref 0.0–5.0)
HEMATOCRIT: 50.1 % (ref 39.0–52.0)
Hemoglobin: 16.9 g/dL (ref 13.0–17.0)
LYMPHS ABS: 1.9 10*3/uL (ref 0.7–4.0)
Lymphocytes Relative: 25.1 % (ref 12.0–46.0)
MCHC: 33.8 g/dL (ref 30.0–36.0)
MCV: 87.3 fl (ref 78.0–100.0)
MONO ABS: 0.7 10*3/uL (ref 0.1–1.0)
Monocytes Relative: 9.4 % (ref 3.0–12.0)
NEUTROS ABS: 4.8 10*3/uL (ref 1.4–7.7)
NEUTROS PCT: 61.8 % (ref 43.0–77.0)
PLATELETS: 202 10*3/uL (ref 150.0–400.0)
RBC: 5.74 Mil/uL (ref 4.22–5.81)
RDW: 13.5 % (ref 11.5–15.5)
WBC: 7.7 10*3/uL (ref 4.0–10.5)

## 2016-08-20 LAB — HEPATIC FUNCTION PANEL
ALBUMIN: 4.5 g/dL (ref 3.5–5.2)
ALK PHOS: 63 U/L (ref 39–117)
ALT: 21 U/L (ref 0–53)
AST: 17 U/L (ref 0–37)
BILIRUBIN DIRECT: 0.1 mg/dL (ref 0.0–0.3)
TOTAL PROTEIN: 6.7 g/dL (ref 6.0–8.3)
Total Bilirubin: 0.7 mg/dL (ref 0.2–1.2)

## 2016-08-20 LAB — BASIC METABOLIC PANEL
BUN: 16 mg/dL (ref 6–23)
CALCIUM: 10 mg/dL (ref 8.4–10.5)
CHLORIDE: 102 meq/L (ref 96–112)
CO2: 31 meq/L (ref 19–32)
CREATININE: 1.16 mg/dL (ref 0.40–1.50)
GFR: 69.08 mL/min (ref >60.00)
GLUCOSE: 166 mg/dL — AB (ref 70–99)
Potassium: 5.7 mEq/L — ABNORMAL HIGH (ref 3.5–5.1)
SODIUM: 137 meq/L (ref 135–145)

## 2016-08-20 LAB — TSH: TSH: 2.18 u[IU]/mL (ref 0.35–4.50)

## 2016-08-20 LAB — HEMOGLOBIN A1C: HEMOGLOBIN A1C: 8.5 % — AB (ref 4.6–6.5)

## 2016-08-20 LAB — LDL CHOLESTEROL, DIRECT: LDL DIRECT: 72 mg/dL

## 2016-08-20 NOTE — Assessment & Plan Note (Signed)
Chronic problem.  Adequate control today.  Asymptomatic.  Check labs.  No anticipated med changes.  Will follow. 

## 2016-08-20 NOTE — Assessment & Plan Note (Signed)
Chronic problem.  Tolerating statin w/o difficulty.  Stressed need for compliance.  Check labs.  Adjust meds prn.

## 2016-08-20 NOTE — Assessment & Plan Note (Signed)
Chronic problem.  Per our records, pt has been out of medication.  Stressed need for compliance.  UTD on foot exam.  On ACE for renal protection.  Due for eye exam- pt to schedule.  Check labs.  Adjust meds prn

## 2016-08-20 NOTE — Progress Notes (Signed)
Pre visit review using our clinic review tool, if applicable. No additional management support is needed unless otherwise documented below in the visit note. 

## 2016-08-20 NOTE — Patient Instructions (Signed)
Follow up in 3-4 months to recheck diabetes We'll notify you of your lab results and make any changes if needed Please call and schedule your eye exam- you are overdue! Continue to work on healthy diet and regular exercise- you can do it! Cut back on your smoking!!! Try and decrease by 1-2 cigarettes/day each week Call with any questions or concerns Happy Spring!!!

## 2016-08-20 NOTE — Progress Notes (Signed)
   Subjective:    Patient ID: Zachary Clay., male    DOB: 1960/12/17, 56 y.o.   MRN: [id number]  HPI HTN- chronic problem, adequate control.  On Lisinopril daily.  Denies CP, SOB, HAs, visual changes, edema.  Continues to smoke.    Hyperlipidemia- chronic problem, on Crestor but records show he is out of medication.  Denies abd pain, N/V, myalgias.  Not exercising  DM- chronic problem, on Januvia, Glimepiride, and Metformin but records show he should be out of medications.  Wilder Glade was just filled to take the place of the [place].  On ACE for renal protection- but again, should be out of meds.  Due for eye exam.  UTD on foot exam.  No symptomatic lows.  Not checking sugars at home.  Denies numbness/tingling of hands or feet.   Review of Systems For ROS see HPI     Objective:   Physical Exam  Constitutional: He is oriented to person, place, and time. He appears well-developed and well-nourished. No distress.  Smells of smoke  HENT:  Head: Normocephalic and atraumatic.  Eyes: Conjunctivae and EOM are normal. Pupils are equal, round, and reactive to light.  Neck: Normal range of motion. Neck supple. No thyromegaly present.  Cardiovascular: Normal rate, regular rhythm, normal heart sounds and intact distal pulses.   No murmur heard. Pulmonary/Chest: Effort normal and breath sounds normal. No respiratory distress.  Abdominal: Soft. Bowel sounds are normal. He exhibits no distension.  Musculoskeletal: He exhibits no edema.  Lymphadenopathy:    He has no cervical adenopathy.  Neurological: He is alert and oriented to person, place, and time. No cranial nerve deficit.  Skin: Skin is warm and dry.  Psychiatric: He has a normal mood and affect. His behavior is normal.  Vitals reviewed.         Assessment & Plan:

## 2016-08-23 ENCOUNTER — Other Ambulatory Visit: Payer: Self-pay | Admitting: Family Medicine

## 2016-08-23 DIAGNOSIS — E875 Hyperkalemia: Secondary | ICD-10-CM

## 2016-08-23 MED ORDER — SITAGLIPTIN PHOSPHATE 100 MG PO TABS: ORAL_TABLET | ORAL | 1 refills | Status: DC

## 2016-08-23 MED ORDER — GLIMEPIRIDE 2 MG PO TABS: ORAL_TABLET | ORAL | 1 refills | Status: DC

## 2016-08-23 MED ORDER — ROSUVASTATIN CALCIUM 20 MG PO TABS: 20.0000 mg | ORAL_TABLET | Freq: Every day | ORAL | 1 refills | Status: DC

## 2016-08-23 MED ORDER — DAPAGLIFLOZIN PROPANEDIOL 10 MG PO TABS: 10.0000 mg | ORAL_TABLET | Freq: Every day | ORAL | 6 refills | Status: DC

## 2016-08-23 MED ORDER — FENOFIBRATE 160 MG PO TABS: 160.0000 mg | ORAL_TABLET | Freq: Every day | ORAL | 6 refills | Status: DC

## 2016-08-23 NOTE — Progress Notes (Signed)
Called pt and lmovm to return call.

## 2016-08-26 ENCOUNTER — Other Ambulatory Visit (INDEPENDENT_AMBULATORY_CARE_PROVIDER_SITE_OTHER): Payer: BLUE CROSS/BLUE SHIELD

## 2016-08-26 DIAGNOSIS — E875 Hyperkalemia: Secondary | ICD-10-CM | POA: Diagnosis not present

## 2016-08-26 LAB — BASIC METABOLIC PANEL
BUN: 22 mg/dL (ref 6–23)
CHLORIDE: 99 meq/L (ref 96–112)
CO2: 29 meq/L (ref 19–32)
Calcium: 9.9 mg/dL (ref 8.4–10.5)
Creatinine, Ser: 1.27 mg/dL (ref 0.40–1.50)
GFR: 62.22 mL/min (ref >60.00)
Glucose, Bld: 154 mg/dL — ABNORMAL HIGH (ref 70–99)
POTASSIUM: 5.4 meq/L — AB (ref 3.5–5.1)
Sodium: 137 mEq/L (ref 135–145)

## 2016-08-29 DIAGNOSIS — G4733 Obstructive sleep apnea (adult) (pediatric): Secondary | ICD-10-CM | POA: Diagnosis not present

## 2016-11-28 DIAGNOSIS — G4733 Obstructive sleep apnea (adult) (pediatric): Secondary | ICD-10-CM | POA: Diagnosis not present

## 2016-12-28 ENCOUNTER — Other Ambulatory Visit: Payer: Self-pay | Admitting: Family Medicine

## 2017-01-05 ENCOUNTER — Other Ambulatory Visit: Payer: Self-pay | Admitting: Family Medicine

## 2017-02-15 ENCOUNTER — Other Ambulatory Visit: Payer: Self-pay | Admitting: Family Medicine

## 2017-02-27 ENCOUNTER — Ambulatory Visit: Payer: BLUE CROSS/BLUE SHIELD | Admitting: Family Medicine

## 2017-02-27 ENCOUNTER — Telehealth: Payer: Self-pay | Admitting: Family Medicine

## 2017-02-27 DIAGNOSIS — Z0289 Encounter for other administrative examinations: Secondary | ICD-10-CM

## 2017-02-27 NOTE — Telephone Encounter (Signed)
Pt came in at 8:42 for his 8:30, I advised him that we would need to reschedule his appt since we were past his appt time. I offered him a 1:00 for same day. Pt stated he could not come back. I then offered another day,he said no and left.

## 2017-02-27 NOTE — Telephone Encounter (Signed)
FYI

## 2017-02-27 NOTE — Telephone Encounter (Signed)
Noted.  Levada Dy followed protocol and did the right thing.

## 2017-03-17 ENCOUNTER — Other Ambulatory Visit: Payer: Self-pay | Admitting: Family Medicine

## 2017-04-01 ENCOUNTER — Other Ambulatory Visit: Payer: Self-pay | Admitting: Family Medicine

## 2017-04-21 ENCOUNTER — Other Ambulatory Visit: Payer: Self-pay | Admitting: Family Medicine

## 2017-04-29 ENCOUNTER — Encounter: Payer: Self-pay | Admitting: Family Medicine

## 2017-04-29 ENCOUNTER — Ambulatory Visit: Payer: BLUE CROSS/BLUE SHIELD | Admitting: Family Medicine

## 2017-04-29 VITALS — BP 126/76 | HR 79 | Resp 16 | Ht 67.0 in | Wt 207.2 lb

## 2017-04-29 DIAGNOSIS — E1165 Type 2 diabetes mellitus with hyperglycemia: Secondary | ICD-10-CM

## 2017-04-29 DIAGNOSIS — E1169 Type 2 diabetes mellitus with other specified complication: Secondary | ICD-10-CM

## 2017-04-29 DIAGNOSIS — I10 Essential (primary) hypertension: Secondary | ICD-10-CM | POA: Diagnosis not present

## 2017-04-29 DIAGNOSIS — E785 Hyperlipidemia, unspecified: Secondary | ICD-10-CM | POA: Diagnosis not present

## 2017-04-29 LAB — BASIC METABOLIC PANEL
BUN: 18 mg/dL (ref 6–23)
CHLORIDE: 96 meq/L (ref 96–112)
CO2: 31 mEq/L (ref 19–32)
Calcium: 10.3 mg/dL (ref 8.4–10.5)
Creatinine, Ser: 1.15 mg/dL (ref 0.40–1.50)
GFR: 69.6 mL/min (ref >60.00)
GLUCOSE: 212 mg/dL — AB (ref 70–99)
Potassium: 4.9 mEq/L (ref 3.5–5.1)
SODIUM: 134 meq/L — AB (ref 135–145)

## 2017-04-29 LAB — HEPATIC FUNCTION PANEL
ALK PHOS: 76 U/L (ref 39–117)
ALT: 15 U/L (ref 0–53)
AST: 13 U/L (ref 0–37)
Albumin: 4.4 g/dL (ref 3.5–5.2)
BILIRUBIN DIRECT: 0.1 mg/dL (ref 0.0–0.3)
Total Bilirubin: 1 mg/dL (ref 0.2–1.2)
Total Protein: 6.9 g/dL (ref 6.0–8.3)

## 2017-04-29 LAB — CBC WITH DIFFERENTIAL/PLATELET
BASOS ABS: 0.1 10*3/uL (ref 0.0–0.1)
Basophils Relative: 0.6 % (ref 0.0–3.0)
Eosinophils Absolute: 0.2 10*3/uL (ref 0.0–0.7)
Eosinophils Relative: 1.8 % (ref 0.0–5.0)
HCT: 52.1 % — ABNORMAL HIGH (ref 39.0–52.0)
Hemoglobin: 17.6 g/dL — ABNORMAL HIGH (ref 13.0–17.0)
LYMPHS ABS: 2.2 10*3/uL (ref 0.7–4.0)
Lymphocytes Relative: 25.8 % (ref 12.0–46.0)
MCHC: 33.9 g/dL (ref 30.0–36.0)
MCV: 89.1 fl (ref 78.0–100.0)
MONOS PCT: 9.1 % (ref 3.0–12.0)
Monocytes Absolute: 0.8 10*3/uL (ref 0.1–1.0)
NEUTROS ABS: 5.3 10*3/uL (ref 1.4–7.7)
NEUTROS PCT: 62.7 % (ref 43.0–77.0)
PLATELETS: 211 10*3/uL (ref 150.0–400.0)
RBC: 5.84 Mil/uL — ABNORMAL HIGH (ref 4.22–5.81)
RDW: 13 % (ref 11.5–15.5)
WBC: 8.4 10*3/uL (ref 4.0–10.5)

## 2017-04-29 LAB — LIPID PANEL
Cholesterol: 251 mg/dL — ABNORMAL HIGH (ref 0–200)
HDL: 31 mg/dL — AB (ref >39.00)
Total CHOL/HDL Ratio: 8

## 2017-04-29 LAB — HEMOGLOBIN A1C: Hgb A1c MFr Bld: 11.4 % — ABNORMAL HIGH (ref 4.6–6.5)

## 2017-04-29 LAB — TSH: TSH: 1.52 u[IU]/mL (ref 0.35–4.50)

## 2017-04-29 LAB — LDL CHOLESTEROL, DIRECT: Direct LDL: 129 mg/dL

## 2017-04-29 MED ORDER — CLOTRIMAZOLE-BETAMETHASONE 1-0.05 % EX CREA
1.0000 | TOPICAL_CREAM | Freq: Two times a day (BID) | CUTANEOUS | 3 refills | Status: DC
Start: 2017-04-29 — End: 2018-02-01

## 2017-04-29 MED ORDER — GLIMEPIRIDE 2 MG PO TABS: ORAL_TABLET | ORAL | 1 refills | Status: DC
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
## 2017-04-29 NOTE — Assessment & Plan Note (Signed)
Chronic problem.  Adequate control.  Asymptomatic.  Stressed need for weight loss and smoking cessation.  Check labs.  No anticipated med changes.  Will follow.

## 2017-04-29 NOTE — Patient Instructions (Signed)
Follow up in 3 months to recheck diabetes We'll notify you of your lab results and make any changes if needed Please make sure you are taking your medication regularly- this is very important to control your sugars Please schedule your eye exam Continue to work on healthy, low carb (sugar) diet and get regular exercise STOP SMOKING! Call with any questions or concerns Happy Fall!!

## 2017-04-29 NOTE — Addendum Note (Signed)
Addended by: Midge Minium on: 04/29/2017 11:06 AM   Modules accepted: Orders

## 2017-04-29 NOTE — Progress Notes (Signed)
   Subjective:    Patient ID: Zachary Clay., male    DOB: Oct 26, 1960, 56 y.o.   MRN: [id number]  HPI DM- chronic problem.  Pt has hx of noncompliance w/ meds and f/u.  On Januvia, Metformin, Farxiga, and Glimepiride (but pt states he has been out of Glimepiride x2 weeks).  He states his home CBG was recently 580.  Overdue on eye exam, foot exam.  On ACE for renal protection.  No numbness/tingling of hands/feet.    HTN- chronic problem, on Lisinopril 20mg  daily w/ adequate control.  Pt continues to smoke.  No Cp, SOB, HAs, visual changes, edema.  Hyperlipidemia- chronic problem, on Crestor 20mg  daily.  No abd pain, N/V.   Review of Systems For ROS see HPI     Objective:   Physical Exam  Constitutional: He is oriented to person, place, and time. He appears well-developed and well-nourished. No distress.  Smells of cigarette smoke obese  HENT:  Head: Normocephalic and atraumatic.  Eyes: Conjunctivae and EOM are normal. Pupils are equal, round, and reactive to light.  Neck: Normal range of motion. Neck supple. No thyromegaly present.  Cardiovascular: Normal rate, regular rhythm, normal heart sounds and intact distal pulses.  No murmur heard. Pulmonary/Chest: Effort normal and breath sounds normal. No respiratory distress.  Abdominal: Soft. Bowel sounds are normal. He exhibits no distension.  Musculoskeletal: He exhibits no edema.  Lymphadenopathy:    He has no cervical adenopathy.  Neurological: He is alert and oriented to person, place, and time. No cranial nerve deficit.  Skin: Skin is warm and dry.  Psychiatric: He has a normal mood and affect. His behavior is normal.  Vitals reviewed.         Assessment & Plan:

## 2017-04-29 NOTE — Assessment & Plan Note (Signed)
Chronic problem.  Tolerating statin w/o difficulty.  Check labs.  Adjust meds prn  

## 2017-05-01 ENCOUNTER — Other Ambulatory Visit: Payer: Self-pay | Admitting: Family Medicine

## 2017-05-01 ENCOUNTER — Other Ambulatory Visit: Payer: Self-pay | Admitting: General Practice

## 2017-05-01 DIAGNOSIS — E1165 Type 2 diabetes mellitus with hyperglycemia: Secondary | ICD-10-CM

## 2017-05-01 MED ORDER — ROSUVASTATIN CALCIUM 20 MG PO TABS: 20.0000 mg | ORAL_TABLET | Freq: Every day | ORAL | 1 refills | Status: DC

## 2017-05-01 MED ORDER — FENOFIBRATE 160 MG PO TABS: 160.0000 mg | ORAL_TABLET | Freq: Every day | ORAL | 1 refills | Status: DC

## 2017-05-04 ENCOUNTER — Other Ambulatory Visit: Payer: Self-pay | Admitting: Family Medicine

## 2017-05-07 ENCOUNTER — Telehealth: Payer: Self-pay | Admitting: Endocrinology

## 2017-05-07 ENCOUNTER — Ambulatory Visit: Payer: BLUE CROSS/BLUE SHIELD | Admitting: Endocrinology

## 2017-05-07 ENCOUNTER — Encounter: Payer: Self-pay | Admitting: Endocrinology

## 2017-05-07 ENCOUNTER — Other Ambulatory Visit: Payer: Self-pay | Admitting: Family Medicine

## 2017-05-07 VITALS — BP 148/76 | HR 77 | Wt 210.6 lb

## 2017-05-07 DIAGNOSIS — E1165 Type 2 diabetes mellitus with hyperglycemia: Secondary | ICD-10-CM | POA: Diagnosis not present

## 2017-05-07 LAB — MICROALBUMIN / CREATININE URINE RATIO
Creatinine,U: 16.4 mg/dL
Microalb Creat Ratio: 35.9 mg/g — ABNORMAL HIGH (ref 0.0–30.0)
Microalb, Ur: 5.9 mg/dL — ABNORMAL HIGH (ref 0.0–1.9)

## 2017-05-07 LAB — POCT GLYCOSYLATED HEMOGLOBIN (HGB A1C): HEMOGLOBIN A1C: 10.5

## 2017-05-07 MED ORDER — METFORMIN HCL ER 500 MG PO TB24: 2000.0000 mg | ORAL_TABLET | Freq: Every day | ORAL | 11 refills | Status: DC

## 2017-05-07 MED ORDER — DULAGLUTIDE 1.5 MG/0.5ML ~~LOC~~ SOAJ: 1.5000 mg | SUBCUTANEOUS | 11 refills | Status: DC

## 2017-05-07 MED ORDER — GLIMEPIRIDE 2 MG PO TABS: 2.0000 mg | ORAL_TABLET | Freq: Every day | ORAL | 3 refills | Status: DC

## 2017-05-07 MED ORDER — DAPAGLIFLOZIN PROPANEDIOL 10 MG PO TABS: 10.0000 mg | ORAL_TABLET | Freq: Every day | ORAL | 3 refills | Status: DC

## 2017-05-07 NOTE — Patient Instructions (Addendum)
check your blood sugar once a day.  vary the time of day when you check, between before the 3 meals, and at bedtime.  also check if you have symptoms of your blood sugar being too high or too low.  please keep a record of the readings and bring it to your next appointment here (or you can bring the meter itself).  You can write it on any piece of paper.  please call us sooner if your blood sugar goes below 70, or if you have a lot of readings over 200. I have sent these prescriptions to your pharmacy:  To change the metformin to extended-release, to reduce the nausea.  To change januvia to "trulicity."   To continue the same farxiga and glimepiride.   Please come back for a follow-up appointment in 2 weeks.

## 2017-05-07 NOTE — Telephone Encounter (Signed)
New Message  Pt verbalized wanting to speak to nurse before he takes the new medication.  Please f/u

## 2017-05-07 NOTE — Progress Notes (Signed)
Subjective:    Patient ID: Zachary Clay., male    DOB: 27-Feb-1961, 56 y.o.   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  no cbg record, but states cbg's are well-controlled.  Pt says he was off the amaryl at the time of last week's a1c.   He is back on now, and his diet is also better since then.  He has reduced metformin, due to nausea.  Past Medical History:  Diagnosis Date  . Diabetes mellitus   . Hyperlipidemia   . Personal history of colonic adenomas 02/16/2013  . Polycythemia 2012   Hgb 17.8    Past Surgical History:  Procedure Laterality Date  . COLONOSCOPY W/ POLYPECTOMY  2003   [facility] - no pre-cancerous polyps  . ESOPHAGOGASTRODUODENOSCOPY  2003   [facility]    Social History   Socioeconomic History  . Marital status: Married    Spouse name: Not on file  . Number of children: Not on file  . Years of education: Not on file  . Highest education level: Not on file  Social Needs  . Financial resource strain: Not on file  . Food insecurity - worry: Not on file  . Food insecurity - inability: Not on file  . Transportation needs - medical: Not on file  . Transportation needs - non-medical: Not on file  Occupational History  . Not on file  Tobacco Use  . Smoking status: Current Every Day Smoker    Packs/day: 2.00  . Smokeless tobacco: Never Used  . Tobacco comment: smoked 1977- present, up to 3 ppd  Substance and Sexual Activity  . Alcohol use: Yes    Alcohol/week: 3.6 oz    Types: 6 Cans of beer per week    Comment: Beer  . Drug use: No  . Sexual activity: Not on file  Other Topics Concern  . Not on file    Social History Narrative  . Not on file    Current Outpatient Medications on File Prior to Visit  Medication Sig Dispense Refill  . clotrimazole-betamethasone (LOTRISONE) cream Apply 1 application 2 (two) times daily topically. 45 g 3  . esomeprazole (NEXIUM) 40 MG capsule TAKE ONE CAPSULE BEFORE BREAKFAST 90 capsule 1  . fenofibrate 160 MG tablet Take 1 tablet (160 mg total) daily by mouth. 90 tablet 1  . lisinopril (PRINIVIL,ZESTRIL) 20 MG tablet TAKE 1/2 TABLET DAILY AS DIRECTED 90 tablet 0  . ONETOUCH DELICA LANCETS MISC by Does not apply route. Check blood sugar once daily     . rosuvastatin (CRESTOR) 20 MG tablet Take 1 tablet (20 mg total) daily by mouth. 90 tablet 1  . sildenafil (VIAGRA) 100 MG tablet Take 1 tablet (100 mg total) by mouth daily as needed for erectile dysfunction. 10 tablet 6   No current facility-administered medications on file prior to visit.     Allergies  Allergen Reactions  . Pioglitazone     REACTION: edema    Family History  Problem Relation Age of Onset  . Heart attack Father 57  . Diabetes Mother   . Melanoma Sister   . Diabetes Maternal  Grandmother   . Cancer Maternal Grandmother        stomach  . Diabetes Maternal Grandfather   . Stroke Neg Hx   . Colon cancer Neg Hx     BP (!) 148/76 (BP Location: Left Arm, Patient Position: Sitting, Cuff Size: Normal)   Pulse 77   Wt 210 lb 9.6 oz (95.5 kg)   SpO2 94%   BMI 32.98 kg/m    Review of Systems He denies hypoglycemia    Objective:   Physical Exam VITAL SIGNS:  See vs page GENERAL: no distress Pulses: foot pulses are intact bilaterally.   MSK: no deformity of the feet or ankles.  CV: no edema of the legs or ankles Skin:  no ulcer on the feet or ankles.  normal color and temp on the feet and ankles Neuro: sensation is intact to touch on the feet and ankles.   Ext: There is bilateral onychomycosis of the toenails  Lab Results  Component Value Date   HGBA1C 11.4 (H) 04/29/2017        Assessment & Plan:  Type 2 DM, with nephropathy: he needs increased rx. Nausea, prob due to metformin.  Noncompliance with cbg recording.  Occupational status: he needs to control DM without insulin.   Patient Instructions  check your blood sugar once a day.  vary the time of day when you check, between before the 3 meals, and at bedtime.  also check if you have symptoms of your blood sugar being too high or too low.  please keep a record of the readings and bring it to your next appointment here (or you can bring the meter itself).  You can write it on any piece of paper.  please call us sooner if your blood sugar goes below 70, or if you have a lot of readings over 200. I have sent these prescriptions to your pharmacy:  To change the metformin to extended-release, to reduce the nausea.  To change januvia to "trulicity."   To continue the same farxiga and glimepiride.   Please come back for a follow-up appointment in 2 weeks.

## 2017-05-08 NOTE — Telephone Encounter (Signed)
Called patient & clarified medications that he should be taking. He stated that his blood sugar was 88 with new test strips & he threw old ones away since they were expired. I stated that patient should call us if blood sugars go too low.

## 2017-05-22 ENCOUNTER — Encounter: Payer: Self-pay | Admitting: Endocrinology

## 2017-05-22 ENCOUNTER — Ambulatory Visit: Payer: BLUE CROSS/BLUE SHIELD | Admitting: Endocrinology

## 2017-05-22 VITALS — BP 124/82 | HR 69 | Wt 203.2 lb

## 2017-05-22 DIAGNOSIS — E1165 Type 2 diabetes mellitus with hyperglycemia: Secondary | ICD-10-CM

## 2017-05-22 NOTE — Patient Instructions (Addendum)
check your blood sugar once a day.  vary the time of day when you check, between before the 3 meals, and at bedtime.  also check if you have symptoms of your blood sugar being too high or too low.  please keep a record of the readings and bring it to your next appointment here (or you can bring the meter itself).  You can write it on any piece of paper.  please call us sooner if your blood sugar goes below 70, or if you have a lot of readings over 200. blood tests are requested for you today.  We'll let you know about the results. Please come back for a follow-up appointment in 3 months.

## 2017-05-22 NOTE — Progress Notes (Signed)
Subjective:    Patient ID: Zachary Levee., male    DOB: [birth year], 56 y.o.   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  Interval history:  no cbg record, but states cbg's vary from 65-95. pt states he feels well in general. Pt says this is much better since diet improved a few weeks ago.   Past Medical History:  Diagnosis Date  . Diabetes mellitus   . Hyperlipidemia   . Personal history of colonic adenomas 02/16/2013  . Polycythemia 2012   Hgb 17.8    Past Surgical History:  Procedure Laterality Date  . COLONOSCOPY W/ POLYPECTOMY  2003   [facility] - no pre-cancerous polyps  . ESOPHAGOGASTRODUODENOSCOPY  2003   [facility]    Social History   Socioeconomic History  . Marital status: Married    Spouse name: Not on file  . Number of children: Not on file  . Years of education: Not on file  . Highest education level: Not on file  Social Needs  . Financial resource strain: Not on file  . Food insecurity - worry: Not on file  . Food insecurity - inability: Not on file  . Transportation needs - medical: Not on file  . Transportation needs - non-medical: Not on file  Occupational History  . Not on file  Tobacco Use  . Smoking status: Current Every Day Smoker    Packs/day: 2.00  . Smokeless tobacco: Never Used  . Tobacco comment: smoked 1977- present, up to 3 ppd  Substance and Sexual Activity  . Alcohol use: Yes    Alcohol/week: 3.6 oz    Types: 6 Cans of beer per week    Comment: Beer  . Drug use: No  . Sexual activity: Not on file  Other Topics Concern  . Not on file  Social History Narrative  . Not on file    Current Outpatient Medications on File Prior to Visit   Medication Sig Dispense Refill  . clotrimazole-betamethasone (LOTRISONE) cream Apply 1 application 2 (two) times daily topically. 45 g 3  . dapagliflozin propanediol (FARXIGA) 10 MG TABS tablet Take 10 mg daily by mouth. 90 tablet 3  . Dulaglutide (TRULICITY) 1.5 KX/3.8HW SOPN Inject 1.5 mg once a week into the skin. 4 pen 11  . esomeprazole (NEXIUM) 40 MG capsule TAKE ONE CAPSULE BEFORE BREAKFAST 90 capsule 1  . fenofibrate 160 MG tablet Take 1 tablet (160 mg total) daily by mouth. 90 tablet 1  . glimepiride (AMARYL) 2 MG tablet Take 1 tablet (2 mg total) daily with breakfast by mouth. TAKE 1 TABLET (2 MG TOTAL) BY MOUTH DAILY BEFORE BREAKFAST. 90 tablet 3  . glucose blood (ONE TOUCH ULTRA TEST) test strip CHECK BLOOD SUGAR DAILY 100 each 12  . lisinopril (PRINIVIL,ZESTRIL) 20 MG tablet TAKE 1/2 TABLET DAILY AS DIRECTED 90 tablet 0  . metFORMIN (GLUCOPHAGE-XR) 500 MG 24 hr tablet Take 4 tablets (2,000 mg total) daily by mouth. 120 tablet 11  . ONETOUCH DELICA LANCETS MISC by Does not apply route. Check blood sugar once daily     . rosuvastatin (CRESTOR) 20 MG tablet Take 1 tablet (20 mg total) daily by mouth. 90 tablet 1  .  sildenafil (VIAGRA) 100 MG tablet Take 1 tablet (100 mg total) by mouth daily as needed for erectile dysfunction. 10 tablet 6   No current facility-administered medications on file prior to visit.     Allergies  Allergen Reactions  . Pioglitazone     REACTION: edema    Family History  Problem Relation Age of Onset  . Heart attack Father 16  . Diabetes Mother   . Melanoma Sister   . Diabetes Maternal Grandmother   . Cancer Maternal Grandmother        stomach  . Diabetes Maternal Grandfather   . Stroke Neg Hx   . Colon cancer Neg Hx     BP 124/82 (BP Location: Left Arm, Patient Position: Sitting, Cuff Size: Normal)   Pulse 69   Wt 203 lb 3.2 oz (92.2 kg)   SpO2 94%   BMI 31.83 kg/m    Review of Systems Denies LOC.   He has lost 10 lbs.        Objective:   Physical Exam VITAL SIGNS:  See vs page.  GENERAL: no distress.  Pulses: foot pulses are intact bilaterally.   MSK: no deformity of the feet or ankles.  CV: no edema of the legs or ankles.  Skin:  no ulcer on the feet or ankles.  normal color and temp on the feet and ankles.  Neuro: sensation is intact to touch on the feet and ankles.   Ext: There is bilateral onychomycosis of the toenails.    Lab Results  Component Value Date   HGBA1C 10.5 05/07/2017      Assessment & Plan:  Type 2 DM, with nephropathy: worse. a1c is much worse than reported cbg's.  Check fructosamine Occupational status: he needs to control glucose without insulin.  Patient Instructions  check your blood sugar once a day.  vary the time of day when you check, between before the 3 meals, and at bedtime.  also check if you have symptoms of your blood sugar being too high or too low.  please keep a record of the readings and bring it to your next appointment here (or you can bring the meter itself).  You can write it on any piece of paper.  please call us sooner if your blood sugar goes below 70, or if you have a lot of readings over 200. blood tests are requested for you today.  We'll let you know about the results. Please come back for a follow-up appointment in 3 months.

## 2017-05-26 LAB — FRUCTOSAMINE: FRUCTOSAMINE: 267 umol/L (ref 190–270)

## 2017-06-23 ENCOUNTER — Other Ambulatory Visit: Payer: Self-pay | Admitting: General Practice

## 2017-06-23 MED ORDER — ESOMEPRAZOLE MAGNESIUM 40 MG PO CPDR: DELAYED_RELEASE_CAPSULE | ORAL | 1 refills | Status: DC

## 2017-07-22 DIAGNOSIS — J0110 Acute frontal sinusitis, unspecified: Secondary | ICD-10-CM | POA: Diagnosis not present

## 2017-07-28 ENCOUNTER — Other Ambulatory Visit: Payer: Self-pay | Admitting: General Practice

## 2017-07-28 MED ORDER — LISINOPRIL 20 MG PO TABS: ORAL_TABLET | ORAL | 0 refills | Status: DC

## 2017-07-31 ENCOUNTER — Other Ambulatory Visit: Payer: Self-pay

## 2017-07-31 ENCOUNTER — Ambulatory Visit: Payer: BLUE CROSS/BLUE SHIELD | Admitting: Family Medicine

## 2017-07-31 ENCOUNTER — Encounter: Payer: Self-pay | Admitting: Family Medicine

## 2017-07-31 VITALS — BP 123/83 | HR 71 | Temp 98.3°F | Resp 16 | Ht 67.0 in | Wt 198.1 lb

## 2017-07-31 DIAGNOSIS — E1169 Type 2 diabetes mellitus with other specified complication: Secondary | ICD-10-CM | POA: Diagnosis not present

## 2017-07-31 DIAGNOSIS — E785 Hyperlipidemia, unspecified: Secondary | ICD-10-CM | POA: Diagnosis not present

## 2017-07-31 LAB — HEPATIC FUNCTION PANEL
ALBUMIN: 4.2 g/dL (ref 3.5–5.2)
ALK PHOS: 41 U/L (ref 39–117)
ALT: 21 U/L (ref 0–53)
AST: 19 U/L (ref 0–37)
Bilirubin, Direct: 0.1 mg/dL (ref 0.0–0.3)
TOTAL PROTEIN: 6.4 g/dL (ref 6.0–8.3)
Total Bilirubin: 0.4 mg/dL (ref 0.2–1.2)

## 2017-07-31 LAB — LIPID PANEL
CHOL/HDL RATIO: 3
Cholesterol: 93 mg/dL (ref 0–200)
HDL: 31.7 mg/dL — AB (ref >39.00)
LDL Cholesterol: 44 mg/dL (ref 0–99)
NonHDL: 61.05
TRIGLYCERIDES: 83 mg/dL (ref 0.0–149.0)
VLDL: 16.6 mg/dL (ref 0.0–40.0)

## 2017-07-31 NOTE — Progress Notes (Signed)
   Subjective:    Patient ID: Zachary Clay., male    DOB: 1960/11/26, 57 y.o.   MRN: [id number]  HPI Hyperlipidemia- pt's triglycerides were over 500 at last check.  He reports he is now taking his fenofibrate but he hesitates when he responds.  On Crestor 20mg  daily.  Pt reports feeling 'alright'.  No CP, SOB, HAs, abd pain, N/V.  Pt is down 5 lbs since last visit.  Declines flu shot   Review of Systems For ROS see HPI     Objective:   Physical Exam  Constitutional: He is oriented to person, place, and time. He appears well-developed and well-nourished. No distress.  HENT:  Head: Normocephalic and atraumatic.  Eyes: Conjunctivae and EOM are normal. Pupils are equal, round, and reactive to light.  Neck: Normal range of motion. Neck supple. No thyromegaly present.  Cardiovascular: Normal rate, regular rhythm, normal heart sounds and intact distal pulses.  No murmur heard. Pulmonary/Chest: Effort normal and breath sounds normal. No respiratory distress.  Abdominal: Soft. Bowel sounds are normal. He exhibits no distension.  Musculoskeletal: He exhibits no edema.  Lymphadenopathy:    He has no cervical adenopathy.  Neurological: He is alert and oriented to person, place, and time. No cranial nerve deficit.  Skin: Skin is warm and dry.  Psychiatric: He has a normal mood and affect. His behavior is normal.  Vitals reviewed.         Assessment & Plan:

## 2017-07-31 NOTE — Assessment & Plan Note (Signed)
Chronic problem.  3 months ago triglycerides were VERY high (but so was A1C).  Pt reports his sugars are much better controlled since seeing Endo so hopefully that will translate into better lipid control.  On Crestor and Fenofibrate.  Check labs.  Adjust meds prn

## 2017-07-31 NOTE — Patient Instructions (Signed)
Schedule your complete physical in 6 months We'll notify you of your lab results and make any changes if needed Continue the Crestor and Fenofibrate daily Keep up the good work on healthy diet and regular exercise- you're losing weight and sugars are better!!! Call with any questions or concerns Happy Valentine's Day!!!

## 2017-08-01 ENCOUNTER — Encounter: Payer: Self-pay | Admitting: General Practice

## 2017-08-06 DIAGNOSIS — Z136 Encounter for screening for cardiovascular disorders: Secondary | ICD-10-CM | POA: Diagnosis not present

## 2017-08-06 DIAGNOSIS — Z131 Encounter for screening for diabetes mellitus: Secondary | ICD-10-CM | POA: Diagnosis not present

## 2017-08-06 DIAGNOSIS — Z13220 Encounter for screening for lipoid disorders: Secondary | ICD-10-CM | POA: Diagnosis not present

## 2017-08-06 DIAGNOSIS — Z713 Dietary counseling and surveillance: Secondary | ICD-10-CM | POA: Diagnosis not present

## 2017-08-11 ENCOUNTER — Telehealth: Payer: Self-pay | Admitting: Family Medicine

## 2017-08-11 DIAGNOSIS — R109 Unspecified abdominal pain: Secondary | ICD-10-CM | POA: Diagnosis not present

## 2017-08-11 NOTE — Telephone Encounter (Signed)
Copied from [place]. Topic: Referral - Question >> Aug 11, 2017  2:22 PM Burnis Medin, NT wrote: Reason for CRM: Patient called and wanted to see if the nurse could give him a call regarding getting a ultra sound for his gall bladder. He would like a call back.

## 2017-08-11 NOTE — Telephone Encounter (Signed)
Spoke with pt, he was seen at a UC in [place] this weekend and they advised pt to contact his PCP office. Pt has been having RUQ pain since Thursday/Friday. It is painful to the touch and starting at 2:30 this morning pt has been having green colored, loose stools. Per UC pt needs to have [place] of gallbladder, however, they are unable to order.

## 2017-08-12 NOTE — Telephone Encounter (Signed)
Spoke with patient. He refused any appointments says he cannot miss any more work he will just go to the emergency room.

## 2017-08-12 NOTE — Telephone Encounter (Signed)
If RUQ is painful to touch and sxs are worsening, he needs to go to the ER for evaluation.  If refusing ER eval, he would need appt here as I cannot order [place] w/o evaluation

## 2017-08-21 ENCOUNTER — Encounter: Payer: Self-pay | Admitting: Endocrinology

## 2017-08-21 ENCOUNTER — Ambulatory Visit: Payer: BLUE CROSS/BLUE SHIELD | Admitting: Endocrinology

## 2017-08-21 VITALS — BP 120/80 | HR 73 | Ht 67.0 in | Wt 198.8 lb

## 2017-08-21 DIAGNOSIS — E1165 Type 2 diabetes mellitus with hyperglycemia: Secondary | ICD-10-CM

## 2017-08-21 LAB — POCT GLYCOSYLATED HEMOGLOBIN (HGB A1C): HEMOGLOBIN A1C: 6.7

## 2017-08-21 MED ORDER — GLIMEPIRIDE 1 MG PO TABS: 1.0000 mg | ORAL_TABLET | Freq: Every day | ORAL | 3 refills | Status: DC

## 2017-08-21 MED ORDER — METFORMIN HCL ER 500 MG PO TB24: 1000.0000 mg | ORAL_TABLET | Freq: Every day | ORAL | 3 refills | Status: DC

## 2017-08-21 NOTE — Patient Instructions (Addendum)
check your blood sugar once a day.  vary the time of day when you check, between before the 3 meals, and at bedtime.  also check if you have symptoms of your blood sugar being too high or too low.  please keep a record of the readings and bring it to your next appointment here (or you can bring the meter itself).  You can write it on any piece of paper.  please call us sooner if your blood sugar goes below 70, or if you have a lot of readings over 200. Please continue the same metformin (2 pills per day), and: Reduce the glimepiride to 1 mg each morning, and:  Please continue the same other medications.  Please come back for a follow-up appointment in 3 months.

## 2017-08-21 NOTE — Progress Notes (Signed)
Subjective:    Patient ID: Zachary Clay., male    DOB: 24-Feb-1961, 57 y.o.   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  Interval history:  He reduced metformin to 500-BID, due to abd pain.  sxs resolved.   Past Medical History:  Diagnosis Date  . Diabetes mellitus   . Hyperlipidemia   . Personal history of colonic adenomas 02/16/2013  . Polycythemia 2012   Hgb 17.8    Past Surgical History:  Procedure Laterality Date  . COLONOSCOPY W/ POLYPECTOMY  2003   [facility] - no pre-cancerous polyps  . ESOPHAGOGASTRODUODENOSCOPY  2003   [facility]    Social History   Socioeconomic History  . Marital status: Married    Spouse name: Not on file  . Number of children: Not on file  . Years of education: Not on file  . Highest education level: Not on file  Social Needs  . Financial resource strain: Not on file  . Food insecurity - worry: Not on file  . Food insecurity - inability: Not on file  . Transportation needs - medical: Not on file  . Transportation needs - non-medical: Not on file  Occupational History  . Not on file  Tobacco Use  . Smoking status: Current Every Day Smoker    Packs/day: 2.00  . Smokeless tobacco: Never Used  . Tobacco comment: smoked 1977- present, up to 3 ppd  Substance and Sexual Activity  . Alcohol use: Yes    Alcohol/week: 3.6 oz    Types: 6 Cans of beer per week    Comment: Beer  . Drug use: No  . Sexual activity: Not on file  Other Topics Concern  . Not on file  Social History Narrative  . Not on file    Current Outpatient Medications on File Prior to Visit  Medication Sig Dispense Refill  . clotrimazole-betamethasone (LOTRISONE) cream  Apply 1 application 2 (two) times daily topically. 45 g 3  . dapagliflozin propanediol (FARXIGA) 10 MG TABS tablet Take 10 mg daily by mouth. 90 tablet 3  . Dulaglutide (TRULICITY) 1.5 JJ/9.4RD SOPN Inject 1.5 mg once a week into the skin. 4 pen 11  . esomeprazole (NEXIUM) 40 MG capsule TAKE ONE CAPSULE BEFORE BREAKFAST 90 capsule 1  . fenofibrate 160 MG tablet Take 1 tablet (160 mg total) daily by mouth. 90 tablet 1  . glucose blood (ONE TOUCH ULTRA TEST) test strip CHECK BLOOD SUGAR DAILY 100 each 12  . lisinopril (PRINIVIL,ZESTRIL) 20 MG tablet TAKE 1/2 TABLET DAILY AS DIRECTED 90 tablet 0  . ONETOUCH DELICA LANCETS MISC by Does not apply route. Check blood sugar once daily     . rosuvastatin (CRESTOR) 20 MG tablet Take 1 tablet (20 mg total) daily by mouth. 90 tablet 1   No current facility-administered medications on file prior to visit.     Allergies  Allergen Reactions  . Pioglitazone     REACTION: edema    Family History  Problem Relation Age of Onset  . Heart attack Father 70  . Diabetes Mother   . Melanoma Sister   . Diabetes Maternal Grandmother   . Cancer Maternal Grandmother  stomach  . Diabetes Maternal Grandfather   . Stroke Neg Hx   . Colon cancer Neg Hx     BP 120/80 (BP Location: Left Arm, Patient Position: Sitting, Cuff Size: Normal)   Pulse 73   Ht 5\' 7"  (1.702 m)   Wt 198 lb 12.8 oz (90.2 kg)   SpO2 95%   BMI 31.14 kg/m   Review of Systems He denies hypoglycemia.      Objective:   Physical Exam VITAL SIGNS:  See vs page.  GENERAL: no distress.  Pulses: foot pulses are intact bilaterally.   MSK: no deformity of the feet or ankles.  CV: no edema of the legs or ankles.  Skin:  no ulcer on the feet or ankles.  normal color and temp on the feet and ankles.  Neuro: sensation is intact to touch on the feet and ankles.   Ext: There is bilateral onychomycosis of the toenails.    Lab Results  Component Value Date   HGBA1C 6.7 08/21/2017    Lab Results  Component Value Date   CREATININE 1.15 04/29/2017   BUN 18 04/29/2017   NA 134 (L) 04/29/2017   K 4.9 04/29/2017   CL 96 04/29/2017   CO2 31 04/29/2017       Assessment & Plan:  Type 2 DM: overcontrolled, for this SU-containing regimen Nausea: this limits metformin dosage. Renal insuff: this limits several med dosages  Patient Instructions  check your blood sugar once a day.  vary the time of day when you check, between before the 3 meals, and at bedtime.  also check if you have symptoms of your blood sugar being too high or too low.  please keep a record of the readings and bring it to your next appointment here (or you can bring the meter itself).  You can write it on any piece of paper.  please call us sooner if your blood sugar goes below 70, or if you have a lot of readings over 200. Please continue the same metformin (2 pills per day), and: Reduce the glimepiride to 1 mg each morning, and:  Please continue the same other medications.  Please come back for a follow-up appointment in 3 months.

## 2017-10-30 ENCOUNTER — Other Ambulatory Visit: Payer: Self-pay | Admitting: Family Medicine

## 2017-11-19 ENCOUNTER — Ambulatory Visit: Payer: BLUE CROSS/BLUE SHIELD | Admitting: Endocrinology

## 2017-11-19 DIAGNOSIS — Z0289 Encounter for other administrative examinations: Secondary | ICD-10-CM

## 2017-11-25 ENCOUNTER — Other Ambulatory Visit: Payer: Self-pay | Admitting: Family Medicine

## 2018-01-26 ENCOUNTER — Other Ambulatory Visit: Payer: Self-pay | Admitting: Family Medicine

## 2018-02-01 ENCOUNTER — Other Ambulatory Visit: Payer: Self-pay | Admitting: Family Medicine

## 2018-02-03 ENCOUNTER — Other Ambulatory Visit: Payer: Self-pay

## 2018-02-03 ENCOUNTER — Ambulatory Visit (INDEPENDENT_AMBULATORY_CARE_PROVIDER_SITE_OTHER): Payer: BLUE CROSS/BLUE SHIELD | Admitting: Family Medicine

## 2018-02-03 ENCOUNTER — Encounter: Payer: Self-pay | Admitting: Family Medicine

## 2018-02-03 VITALS — BP 126/82 | HR 80 | Temp 97.9°F | Resp 16 | Ht 67.0 in | Wt 197.0 lb

## 2018-02-03 DIAGNOSIS — Z86010 Personal history of colonic polyps: Secondary | ICD-10-CM | POA: Diagnosis not present

## 2018-02-03 DIAGNOSIS — Z0000 Encounter for general adult medical examination without abnormal findings: Secondary | ICD-10-CM

## 2018-02-03 DIAGNOSIS — Z125 Encounter for screening for malignant neoplasm of prostate: Secondary | ICD-10-CM | POA: Diagnosis not present

## 2018-02-03 DIAGNOSIS — E1165 Type 2 diabetes mellitus with hyperglycemia: Secondary | ICD-10-CM | POA: Diagnosis not present

## 2018-02-03 LAB — CBC WITH DIFFERENTIAL/PLATELET
BASOS ABS: 0 10*3/uL (ref 0.0–0.1)
Basophils Relative: 0.8 % (ref 0.0–3.0)
EOS ABS: 0.2 10*3/uL (ref 0.0–0.7)
Eosinophils Relative: 3.3 % (ref 0.0–5.0)
HEMATOCRIT: 48.5 % (ref 39.0–52.0)
HEMOGLOBIN: 16.6 g/dL (ref 13.0–17.0)
LYMPHS PCT: 26 % (ref 12.0–46.0)
Lymphs Abs: 1.6 10*3/uL (ref 0.7–4.0)
MCHC: 34.2 g/dL (ref 30.0–36.0)
MCV: 88.3 fl (ref 78.0–100.0)
Monocytes Absolute: 0.8 10*3/uL (ref 0.1–1.0)
Monocytes Relative: 13.3 % — ABNORMAL HIGH (ref 3.0–12.0)
Neutro Abs: 3.4 10*3/uL (ref 1.4–7.7)
Neutrophils Relative %: 56.6 % (ref 43.0–77.0)
Platelets: 206 10*3/uL (ref 150.0–400.0)
RBC: 5.49 Mil/uL (ref 4.22–5.81)
RDW: 13.5 % (ref 11.5–15.5)
WBC: 6.1 10*3/uL (ref 4.0–10.5)

## 2018-02-03 LAB — BASIC METABOLIC PANEL
BUN: 16 mg/dL (ref 6–23)
CALCIUM: 9.5 mg/dL (ref 8.4–10.5)
CO2: 32 meq/L (ref 19–32)
CREATININE: 1.15 mg/dL (ref 0.40–1.50)
Chloride: 101 mEq/L (ref 96–112)
GFR: 69.42 mL/min (ref >60.00)
GLUCOSE: 88 mg/dL (ref 70–99)
Potassium: 4.3 mEq/L (ref 3.5–5.1)
Sodium: 138 mEq/L (ref 135–145)

## 2018-02-03 LAB — HEPATIC FUNCTION PANEL
ALT: 15 U/L (ref 0–53)
AST: 15 U/L (ref 0–37)
Albumin: 4.3 g/dL (ref 3.5–5.2)
Alkaline Phosphatase: 44 U/L (ref 39–117)
BILIRUBIN DIRECT: 0.1 mg/dL (ref 0.0–0.3)
TOTAL PROTEIN: 6.5 g/dL (ref 6.0–8.3)
Total Bilirubin: 0.6 mg/dL (ref 0.2–1.2)

## 2018-02-03 LAB — HEMOGLOBIN A1C: Hgb A1c MFr Bld: 6.6 % — ABNORMAL HIGH (ref 4.6–6.5)

## 2018-02-03 LAB — LIPID PANEL
CHOLESTEROL: 100 mg/dL (ref 0–200)
HDL: 29 mg/dL — AB (ref >39.00)
LDL Cholesterol: 56 mg/dL (ref 0–99)
NonHDL: 71.38
Total CHOL/HDL Ratio: 3
Triglycerides: 77 mg/dL (ref 0.0–149.0)
VLDL: 15.4 mg/dL (ref 0.0–40.0)

## 2018-02-03 LAB — TSH: TSH: 1.39 u[IU]/mL (ref 0.35–4.50)

## 2018-02-03 LAB — PSA: PSA: 1.56 ng/mL (ref 0.10–4.00)

## 2018-02-03 MED ORDER — CETIRIZINE HCL 10 MG PO TABS
10.0000 mg | ORAL_TABLET | Freq: Every day | ORAL | 11 refills | Status: DC
Start: 2018-02-03 — End: 2022-09-11

## 2018-02-03 NOTE — Patient Instructions (Addendum)
Follow up in 6 months to recheck cholesterol and BP We'll notify you of your lab results and make any changes if needed [place]!! We'll call you with your GI appt for the repeat colonoscopy START the once daily Cetirizine to improve your congestion and dizziness Drink plenty of water Call with any questions or concerns Happy [place] Birthday!!

## 2018-02-03 NOTE — Assessment & Plan Note (Signed)
Pt's PE WNL w/ exception of obesity.  UTD on Tdap.  Referral placed for recall colonoscopy.  Check labs.  Anticipatory guidance provided.

## 2018-02-03 NOTE — Progress Notes (Signed)
   Subjective:    Patient ID: Zachary Levee., male    DOB: 08-Feb-1961, 57 y.o.   MRN: [id number]  HPI CPE- Due for repeat colonoscopy this year.  UTD on foot exam, on ACE, due for repeat eye exam (pt needs to schedule)   Review of Systems Patient reports no vision/hearing changes, anorexia, fever ,adenopathy, persistant/recurrent hoarseness, swallowing issues, chest pain, palpitations, edema, persistant/recurrent cough, hemoptysis, dyspnea (rest,exertional, paroxysmal nocturnal), gastrointestinal  bleeding (melena, rectal bleeding), abdominal pain, excessive heart burn, GU symptoms (dysuria, hematuria, voiding/incontinence issues) syncope, focal weakness, memory loss, numbness & tingling, skin/hair/nail changes, depression, anxiety, abnormal bruising/bleeding, musculoskeletal symptoms/signs.     Objective:   Physical Exam BP 126/82   Pulse 80   Temp 97.9 F (36.6 C) (Oral)   Resp 16   Ht 5\' 7"  (1.702 m)   Wt 197 lb (89.4 kg)   SpO2 98%   BMI 30.85 kg/m   General Appearance:    Alert, cooperative, no distress, appears stated age  Head:    Normocephalic, without obvious abnormality, atraumatic  Eyes:    PERRL, conjunctiva/corneas clear, EOM's intact, fundi    benign, both eyes       Ears:    TMs retracted bilaterally but otherwise normal  Nose:   Nares normal, septum midline, mucosa normal, no drainage   or sinus tenderness  Throat:   Lips, mucosa, and tongue normal; teeth and gums normal  Neck:   Supple, symmetrical, trachea midline, no adenopathy;       thyroid:  No enlargement/tenderness/nodules  Back:     Symmetric, no curvature, ROM normal, no CVA tenderness  Lungs:     Clear to auscultation bilaterally, respirations unlabored  Chest wall:    No tenderness or deformity  Heart:    Regular rate and rhythm, S1 and S2 normal, no murmur, rub   or gallop  Abdomen:     Soft, non-tender, bowel sounds active all four quadrants,    no masses, no organomegaly  Genitalia:    Normal  male without lesion, discharge or tenderness  Rectal:    Normal tone, normal prostate, no masses or tenderness  Extremities:   Extremities normal, atraumatic, no cyanosis or edema  Pulses:   2+ and symmetric all extremities  Skin:   Skin color, texture, turgor normal, no rashes or lesions  Lymph nodes:   Cervical, supraclavicular, and axillary nodes normal  Neurologic:   CNII-XII intact. Normal strength, sensation and reflexes      throughout          Assessment & Plan:

## 2018-02-03 NOTE — Assessment & Plan Note (Signed)
Chronic problem.  Recent hx of good control.  UTD on foot exam.  On ACE for renal protection.  Overdue on eye exam- pt to schedule.  Check labs.  Adjust meds prn

## 2018-02-03 NOTE — Assessment & Plan Note (Signed)
Refer back to GI for repeat colonoscopy

## 2018-02-04 ENCOUNTER — Encounter: Payer: Self-pay | Admitting: General Practice

## 2018-02-04 ENCOUNTER — Encounter: Payer: Self-pay | Admitting: Internal Medicine

## 2018-03-26 ENCOUNTER — Encounter: Payer: Self-pay | Admitting: Internal Medicine

## 2018-03-26 ENCOUNTER — Ambulatory Visit (AMBULATORY_SURGERY_CENTER): Payer: Self-pay | Admitting: *Deleted

## 2018-03-26 VITALS — Ht 67.0 in | Wt 203.0 lb

## 2018-03-26 DIAGNOSIS — Z86010 Personal history of colonic polyps: Secondary | ICD-10-CM

## 2018-03-26 NOTE — Progress Notes (Signed)
Patient denies any allergies to eggs or soy. Patient denies any problems with anesthesia/sedation. Patient denies any oxygen use at home. Patient denies taking any diet/weight loss medications or blood thinners. EMMI education assisgned to patient on colonoscopy, this was explained and instructions given to patient. 

## 2018-04-09 ENCOUNTER — Ambulatory Visit (AMBULATORY_SURGERY_CENTER): Payer: BLUE CROSS/BLUE SHIELD | Admitting: Internal Medicine

## 2018-04-09 ENCOUNTER — Encounter: Payer: Self-pay | Admitting: Internal Medicine

## 2018-04-09 VITALS — BP 122/74 | HR 75 | Temp 97.8°F | Resp 20

## 2018-04-09 DIAGNOSIS — D125 Benign neoplasm of sigmoid colon: Secondary | ICD-10-CM

## 2018-04-09 DIAGNOSIS — K635 Polyp of colon: Secondary | ICD-10-CM | POA: Diagnosis not present

## 2018-04-09 DIAGNOSIS — Z1211 Encounter for screening for malignant neoplasm of colon: Secondary | ICD-10-CM | POA: Diagnosis not present

## 2018-04-09 DIAGNOSIS — Z86010 Personal history of colonic polyps: Secondary | ICD-10-CM | POA: Diagnosis not present

## 2018-04-09 MED ORDER — SODIUM CHLORIDE 0.9 % IV SOLN: 500.0000 mL | Freq: Once | INTRAVENOUS | Status: DC

## 2018-04-09 NOTE — Op Note (Signed)
[place] Patient Name: Zachary Clay Procedure Date: 04/09/2018 11:26 AM MRN: [id number] Endoscopist: Gatha Mayer , MD Age: 57 Referring MD:  Date of Birth: [birth year] Gender: Male Account #: [id number] Procedure:                Colonoscopy Indications:              Surveillance: Personal history of adenomatous                            polyps on last colonoscopy 5 years ago Medicines:                Propofol per Anesthesia, Monitored Anesthesia Care Procedure:                Pre-Anesthesia Assessment:                           - Prior to the procedure, a History and Physical                            was performed, and patient medications and                            allergies were reviewed. The patient's tolerance of                            previous anesthesia was also reviewed. The risks                            and benefits of the procedure and the sedation                            options and risks were discussed with the patient.                            All questions were answered, and informed consent                            was obtained. Prior Anticoagulants: The patient has                            taken no previous anticoagulant or antiplatelet                            agents. ASA Grade Assessment: III - A patient with                            severe systemic disease. After reviewing the risks                            and benefits, the patient was deemed in                            satisfactory condition to undergo the procedure.  After obtaining informed consent, the colonoscope                            was passed under direct vision. Throughout the                            procedure, the patient's blood pressure, pulse, and                            oxygen saturations were monitored continuously. The                            Colonoscope was introduced through the anus and   advanced to the the cecum, identified by                            appendiceal orifice and ileocecal valve. The                            colonoscopy was somewhat difficult due to                            significant looping. Successful completion of the                            procedure was aided by applying abdominal pressure.                            The patient tolerated the procedure well. The                            quality of the bowel preparation was adequate. The                            bowel preparation used was Miralax. The ileocecal                            valve, appendiceal orifice, and rectum were                            photographed. Scope In: 11:33:17 AM Scope Out: 11:55:42 AM Scope Withdrawal Time: 0 hours 17 minutes 45 seconds  Total Procedure Duration: 0 hours 22 minutes 25 seconds  Findings:                 The perianal and digital rectal examinations were                            normal. Pertinent negatives include normal prostate                            (size, shape, and consistency).                           A 1 to 2 mm polyp was found in the sigmoid  colon.                            The polyp was sessile. The polyp was removed with a                            cold biopsy forceps. Resection and retrieval were                            complete. Verification of patient identification                            for the specimen was done. Estimated blood loss was                            minimal.                           The exam was otherwise without abnormality on                            direct and retroflexion views. Complications:            No immediate complications. Estimated Blood Loss:     Estimated blood loss was minimal. Impression:               - One 1 to 2 mm polyp in the sigmoid colon, removed                            with a cold biopsy forceps. Resected and retrieved.                           - The examination was  otherwise normal on direct                            and retroflexion views.                           - Personal history of colonic polyps. 2 adenomas                            2014 Recommendation:           - Patient has a contact number available for                            emergencies. The signs and symptoms of potential                            delayed complications were discussed with the                            patient. Return to normal activities tomorrow.                            Written discharge instructions were provided to  the                            patient.                           - Resume previous diet.                           - Continue present medications.                           - Repeat colonoscopy is recommended for                            surveillance. The colonoscopy date will be                            determined after pathology results from today's                            exam become available for review. Gatha Mayer, MD 04/09/2018 12:02:26 PM This report has been signed electronically.

## 2018-04-09 NOTE — Patient Instructions (Addendum)
   I found and removed one tiny polyp.  I will let you know pathology results and when to have another routine colonoscopy by mail and/or My Chart.  I appreciate the opportunity to care for you. Carl E. Gessner, MD, FACG   YOU HAD AN ENDOSCOPIC PROCEDURE TODAY AT THE  ENDOSCOPY CENTER:   Refer to the procedure report that was given to you for any specific questions about what was found during the examination.  If the procedure report does not answer your questions, please call your gastroenterologist to clarify.  If you requested that your care partner not be given the details of your procedure findings, then the procedure report has been included in a sealed envelope for you to review at your convenience later.  YOU SHOULD EXPECT: Some feelings of bloating in the abdomen. Passage of more gas than usual.  Walking can help get rid of the air that was put into your GI tract during the procedure and reduce the bloating. If you had a lower endoscopy (such as a colonoscopy or flexible sigmoidoscopy) you may notice spotting of blood in your stool or on the toilet paper. If you underwent a bowel prep for your procedure, you may not have a normal bowel movement for a few days.  Please Note:  You might notice some irritation and congestion in your nose or some drainage.  This is from the oxygen used during your procedure.  There is no need for concern and it should clear up in a day or so.  SYMPTOMS TO REPORT IMMEDIATELY:   Following lower endoscopy (colonoscopy or flexible sigmoidoscopy):  Excessive amounts of blood in the stool  Significant tenderness or worsening of abdominal pains  Swelling of the abdomen that is new, acute  Fever of 100F or higher   For urgent or emergent issues, a gastroenterologist can be reached at any hour by calling [phone].   DIET:  We do recommend a small meal at first, but then you may proceed to your regular diet.  Drink plenty of fluids but you  should avoid alcoholic beverages for 24 hours.  ACTIVITY:  You should plan to take it easy for the rest of today and you should NOT DRIVE or use heavy machinery until tomorrow (because of the sedation medicines used during the test).    FOLLOW UP: Our staff will call the number listed on your records the next business day following your procedure to check on you and address any questions or concerns that you may have regarding the information given to you following your procedure. If we do not reach you, we will leave a message.  However, if you are feeling well and you are not experiencing any problems, there is no need to return our call.  We will assume that you have returned to your regular daily activities without incident.  If any biopsies were taken you will be contacted by phone or by letter within the next 1-3 weeks.  Please call us at [phone] if you have not heard about the biopsies in 3 weeks.    SIGNATURES/CONFIDENTIALITY: You and/or your care partner have signed paperwork which will be entered into your electronic medical record.  These signatures attest to the fact that that the information above on your After Visit Summary has been reviewed and is understood.  Full responsibility of the confidentiality of this discharge information lies with you and/or your care-partner. 

## 2018-04-09 NOTE — Progress Notes (Signed)
Called to room to assist during endoscopic procedure.  Patient ID and intended procedure confirmed with present staff. Received instructions for my participation in the procedure from the performing physician.  

## 2018-04-09 NOTE — Progress Notes (Signed)
Pt's states no medical or surgical changes since previsit or office visit. 

## 2018-04-09 NOTE — Progress Notes (Signed)
To PACU, VSS. Report to Rn.tb 

## 2018-04-10 ENCOUNTER — Telehealth: Payer: Self-pay

## 2018-04-10 NOTE — Telephone Encounter (Signed)
  Follow up Call-  Call back number 04/09/2018  Post procedure Call Back phone  # [phone]  Permission to leave phone message Yes  Some recent data might be hidden     Patient questions:  Do you have a fever, pain , or abdominal swelling? No. Pain Score  0 *  Have you tolerated food without any problems? Yes.    Have you been able to return to your normal activities? Yes.    Do you have any questions about your discharge instructions: Diet   No. Medications  No. Follow up visit  No.  Do you have questions or concerns about your Care? No.  Actions: * If pain score is 4 or above: No action needed, pain <4.

## 2018-04-15 ENCOUNTER — Encounter: Payer: Self-pay | Admitting: Internal Medicine

## 2018-04-15 NOTE — Progress Notes (Signed)
Hyperplastic Only 2 dimin adenomas 2014 so recall 2029 ok

## 2018-04-20 ENCOUNTER — Other Ambulatory Visit: Payer: Self-pay | Admitting: Endocrinology

## 2018-04-25 ENCOUNTER — Other Ambulatory Visit: Payer: Self-pay | Admitting: Family Medicine

## 2018-07-07 ENCOUNTER — Other Ambulatory Visit: Payer: Self-pay | Admitting: General Practice

## 2018-07-07 MED ORDER — FENOFIBRATE 160 MG PO TABS: 160.0000 mg | ORAL_TABLET | Freq: Every day | ORAL | 1 refills | Status: DC

## 2018-07-08 ENCOUNTER — Telehealth: Payer: Self-pay

## 2018-07-08 NOTE — Telephone Encounter (Signed)
Received notification from [facility] re: pt request to refill Trulicity. Request was not authorized by Dr. Loanne Drilling. Advised pt will need to schedule an appt. Can address refill request at his appt. Message routed to scheduling coordinator for scheduling purposes.

## 2018-07-14 ENCOUNTER — Other Ambulatory Visit: Payer: Self-pay | Admitting: Family Medicine

## 2018-07-14 MED ORDER — METFORMIN HCL ER 500 MG PO TB24: 1000.0000 mg | ORAL_TABLET | Freq: Every day | ORAL | 3 refills | Status: DC

## 2018-07-14 MED ORDER — DAPAGLIFLOZIN PROPANEDIOL 10 MG PO TABS: 10.0000 mg | ORAL_TABLET | Freq: Every day | ORAL | 3 refills | Status: DC

## 2018-07-14 NOTE — Telephone Encounter (Signed)
Copied from [place] [id number]. Topic: Quick Communication - Rx Refill/Question >> Jul 14, 2018  9:48 AM Margot Ables wrote: Medication: metFORMIN (GLUCOPHAGE-XR) 500 MG 24 hr tablet & dapagliflozin propanediol (FARXIGA) 10 MG TABS tablet - pt out of farxiga for 1 wk & metformin since yesterday - pt states that he only saw Dr. Loanne Drilling once when he sugar got really high - pt stating PCP should be able to handle - please advise.  Has the patient contacted their pharmacy? Yes - he states pharmacy told them it was requested from Dr. Birdie Riddle  Preferred Pharmacy (with phone number or street name): [place], [place] - [address] [phone] (Phone) [phone] (Fax)

## 2018-07-14 NOTE — Telephone Encounter (Signed)
Requested medication (s) are due for refill today: yes  Requested medication (s) are on the active medication list: yes    Last refill: Glucophage 08/21/17  #180 3 refills    Farxiga   05/07/17  #90  3 refills  Future visit scheduled yes  08/06/2018  Dr. Birdie Riddle  Notes to clinic:historical provider  Requested Prescriptions  Pending Prescriptions Disp Refills   metFORMIN (GLUCOPHAGE-XR) 500 MG 24 hr tablet 180 tablet 3    Sig: Take 2 tablets (1,000 mg total) by mouth daily.     Endocrinology:  Diabetes - Biguanides Passed - 07/14/2018  9:54 AM      Passed - Cr in normal range and within 360 days    Creat  Date Value Ref Range Status  05/06/2014 1.17 0.50 - 1.35 mg/dL Final   Creatinine, Ser  Date Value Ref Range Status  02/03/2018 1.15 0.40 - 1.50 mg/dL Final         Passed - HBA1C is between 0 and 7.9 and within 180 days    Hgb A1c MFr Bld  Date Value Ref Range Status  02/03/2018 6.6 (H) 4.6 - 6.5 % Final    Comment:    Glycemic Control Guidelines for People with Diabetes:Non Diabetic:  <6%Goal of Therapy: <7%Additional Action Suggested:  >8%          Passed - eGFR in normal range and within 360 days    GFR calc Af Amer  Date Value Ref Range Status  01/05/2007 103 mL/min Final   GFR calc non Af Amer  Date Value Ref Range Status  01/05/2007 86 mL/min Final   GFR  Date Value Ref Range Status  02/03/2018 69.42 >60.00 mL/min Final         Passed - Valid encounter within last 6 months    Recent Outpatient Visits          5 months ago Physical exam   [place] Midge Minium, MD   11 months ago Hyperlipidemia associated with type 2 diabetes mellitus [facility])   [facility] [place] Midge Minium, MD   1 year ago Essential hypertension   [place] [facility] [place] Midge Minium, MD   1 year ago Uncontrolled type 2 diabetes mellitus with diabetic nephropathy,  without long-term current use of insulin ([place])   [place] [facility] [place] Midge Minium, MD   2 years ago Uncontrolled type 2 diabetes mellitus with diabetic nephropathy, without long-term current use of insulin ([place])   [place] [facility] [place] Midge Minium, MD      Future Appointments            In 3 weeks Birdie Riddle, Aundra Millet, MD [facility] [facility] [place], [facility]          dapagliflozin propanediol (FARXIGA) 10 MG TABS tablet 90 tablet 3    Sig: Take 10 mg by mouth daily.     Endocrinology:  Diabetes - SGLT2 Inhibitors Passed - 07/14/2018  9:54 AM      Passed - Cr in normal range and within 360 days    Creat  Date Value Ref Range Status  05/06/2014 1.17 0.50 - 1.35 mg/dL Final   Creatinine, Ser  Date Value Ref Range Status  02/03/2018 1.15 0.40 - 1.50 mg/dL Final         Passed - LDL in normal range and within 360 days    LDL Cholesterol  Date Value Ref Range  Status  02/03/2018 56 0 - 99 mg/dL Final         Passed - HBA1C is between 0 and 7.9 and within 180 days    Hgb A1c MFr Bld  Date Value Ref Range Status  02/03/2018 6.6 (H) 4.6 - 6.5 % Final    Comment:    Glycemic Control Guidelines for People with Diabetes:Non Diabetic:  <6%Goal of Therapy: <7%Additional Action Suggested:  >8%          Passed - eGFR in normal range and within 360 days    GFR calc Af Amer  Date Value Ref Range Status  01/05/2007 103 mL/min Final   GFR calc non Af Amer  Date Value Ref Range Status  01/05/2007 86 mL/min Final   GFR  Date Value Ref Range Status  02/03/2018 69.42 >60.00 mL/min Final         Passed - Valid encounter within last 6 months    Recent Outpatient Visits          5 months ago Physical exam   [place] Midge Minium, MD   11 months ago Hyperlipidemia associated with type 2 diabetes mellitus [facility])   [facility] [facility]  [place] Midge Minium, MD   1 year ago Essential hypertension   [place] [facility] [place] Midge Minium, MD   1 year ago Uncontrolled type 2 diabetes mellitus with diabetic nephropathy, without long-term current use of insulin [facility])   [place] [facility] [place] Midge Minium, MD   2 years ago Uncontrolled type 2 diabetes mellitus with diabetic nephropathy, without long-term current use of insulin [facility])   [place] [facility] [place] Midge Minium, MD      Future Appointments            In 3 weeks Tabori, Aundra Millet, MD [facility] [facility] [place], [facility]

## 2018-08-06 ENCOUNTER — Ambulatory Visit: Payer: BLUE CROSS/BLUE SHIELD | Admitting: Family Medicine

## 2018-08-06 ENCOUNTER — Encounter: Payer: Self-pay | Admitting: Family Medicine

## 2018-08-06 ENCOUNTER — Other Ambulatory Visit: Payer: Self-pay

## 2018-08-06 VITALS — BP 118/68 | HR 78 | Temp 98.2°F | Resp 16 | Ht 67.0 in | Wt 202.0 lb

## 2018-08-06 DIAGNOSIS — E785 Hyperlipidemia, unspecified: Secondary | ICD-10-CM | POA: Diagnosis not present

## 2018-08-06 DIAGNOSIS — E669 Obesity, unspecified: Secondary | ICD-10-CM | POA: Diagnosis not present

## 2018-08-06 DIAGNOSIS — E1169 Type 2 diabetes mellitus with other specified complication: Secondary | ICD-10-CM | POA: Diagnosis not present

## 2018-08-06 DIAGNOSIS — I10 Essential (primary) hypertension: Secondary | ICD-10-CM | POA: Diagnosis not present

## 2018-08-06 DIAGNOSIS — E1165 Type 2 diabetes mellitus with hyperglycemia: Secondary | ICD-10-CM

## 2018-08-06 LAB — BASIC METABOLIC PANEL
BUN: 18 mg/dL (ref 6–23)
CHLORIDE: 101 meq/L (ref 96–112)
CO2: 29 meq/L (ref 19–32)
CREATININE: 1.15 mg/dL (ref 0.40–1.50)
Calcium: 9.6 mg/dL (ref 8.4–10.5)
GFR: 65.2 mL/min (ref >60.00)
Glucose, Bld: 98 mg/dL (ref 70–99)
POTASSIUM: 5 meq/L (ref 3.5–5.1)
Sodium: 138 mEq/L (ref 135–145)

## 2018-08-06 LAB — CBC WITH DIFFERENTIAL/PLATELET
Basophils Absolute: 0.1 10*3/uL (ref 0.0–0.1)
Basophils Relative: 0.9 % (ref 0.0–3.0)
Eosinophils Absolute: 0.3 10*3/uL (ref 0.0–0.7)
Eosinophils Relative: 3.8 % (ref 0.0–5.0)
HEMATOCRIT: 49.8 % (ref 39.0–52.0)
Hemoglobin: 17 g/dL (ref 13.0–17.0)
Lymphocytes Relative: 29.6 % (ref 12.0–46.0)
Lymphs Abs: 2.3 10*3/uL (ref 0.7–4.0)
MCHC: 34.2 g/dL (ref 30.0–36.0)
MCV: 86.9 fl (ref 78.0–100.0)
Monocytes Absolute: 0.6 10*3/uL (ref 0.1–1.0)
Monocytes Relative: 8.3 % (ref 3.0–12.0)
Neutro Abs: 4.4 10*3/uL (ref 1.4–7.7)
Neutrophils Relative %: 57.4 % (ref 43.0–77.0)
Platelets: 222 10*3/uL (ref 150.0–400.0)
RBC: 5.74 Mil/uL (ref 4.22–5.81)
RDW: 12.9 % (ref 11.5–15.5)
WBC: 7.6 10*3/uL (ref 4.0–10.5)

## 2018-08-06 LAB — LIPID PANEL
Cholesterol: 92 mg/dL (ref 0–200)
HDL: 29.9 mg/dL — AB (ref >39.00)
LDL Cholesterol: 39 mg/dL (ref 0–99)
NonHDL: 61.82
Total CHOL/HDL Ratio: 3
Triglycerides: 114 mg/dL (ref 0.0–149.0)
VLDL: 22.8 mg/dL (ref 0.0–40.0)

## 2018-08-06 LAB — HEPATIC FUNCTION PANEL
ALT: 19 U/L (ref 0–53)
AST: 19 U/L (ref 0–37)
Albumin: 4.4 g/dL (ref 3.5–5.2)
Alkaline Phosphatase: 45 U/L (ref 39–117)
Bilirubin, Direct: 0.2 mg/dL (ref 0.0–0.3)
Total Bilirubin: 0.8 mg/dL (ref 0.2–1.2)
Total Protein: 6.4 g/dL (ref 6.0–8.3)

## 2018-08-06 LAB — TSH: TSH: 2.7 u[IU]/mL (ref 0.35–4.50)

## 2018-08-06 LAB — HEMOGLOBIN A1C: HEMOGLOBIN A1C: 9.6 % — AB (ref 4.6–6.5)

## 2018-08-06 NOTE — Assessment & Plan Note (Signed)
Chronic problem.  Tolerating statin w/o difficulty.  Encouraged healthy diet and regular exercise.  Check labs.  Adjust meds prn. 

## 2018-08-06 NOTE — Patient Instructions (Signed)
Follow up in 3-4 months to recheck sugars We'll notify you of your lab results and make any changes if needed SCHEDULE your eye exam!!! Continue to work on healthy diet and regular exercise- you can do it! Try and stop smoking!  You can do it! Call with any questions or concerns Happy Valentine's Day!!!

## 2018-08-06 NOTE — Progress Notes (Signed)
   Subjective:    Patient ID: Zachary Levee., male    DOB: [birth year], 57 y.o.   MRN: [id number]  HPI HTN- chronic problem, on Lisinopril 10mg  daily w/ good control.  No CP, SOB, HAs, visual changes, edema.  Hyperlipidemia- chronic problem, on Crestor 20mg  daily and Fenofibrate 160mg  daily w/ hx of good control.  Denies abd pain, N/V.  DM- chronic problem, was following w/ Dr Loanne Drilling but has not seen recently.  On Farxiga 10mg  daily, Amaryl 1mg  daily, Metformin XR 1000mg  daily, and Trulicity 1.5mg  weekly w/ hx of good control.  Due for foot exam, on ACE for renal protection.  Due for eye exam.  No numbness/tingling of hands/feet.  No sores on feet.  Denies symptomatic lows.   Review of Systems For ROS see HPI     Objective:   Physical Exam Vitals signs reviewed.  Constitutional:      General: He is not in acute distress.    Appearance: He is well-developed.  HENT:     Head: Normocephalic and atraumatic.  Eyes:     Conjunctiva/sclera: Conjunctivae normal.     Pupils: Pupils are equal, round, and reactive to light.  Neck:     Musculoskeletal: Normal range of motion and neck supple.     Thyroid: No thyromegaly.  Cardiovascular:     Rate and Rhythm: Normal rate and regular rhythm.     Heart sounds: Normal heart sounds. No murmur.  Pulmonary:     Effort: Pulmonary effort is normal. No respiratory distress.     Breath sounds: Normal breath sounds.  Abdominal:     General: Bowel sounds are normal. There is no distension.     Palpations: Abdomen is soft.  Lymphadenopathy:     Cervical: No cervical adenopathy.  Skin:    General: Skin is warm and dry.  Neurological:     Mental Status: He is alert and oriented to person, place, and time.     Cranial Nerves: No cranial nerve deficit.  Psychiatric:        Behavior: Behavior normal.           Assessment & Plan:

## 2018-08-06 NOTE — Assessment & Plan Note (Signed)
Chronic problem.  No longer seeing Endo.  Pt has hx of good control on current medication regimen.  Foot exam done today.  On ACE for renal protection.  Due for eye exam- pt to schedule.  Encouraged smoking cessation, healthy diet and regular exercise.

## 2018-08-06 NOTE — Assessment & Plan Note (Signed)
Ongoing issue for pt.  Again stressed need for healthy diet and regular exercise.  Will follow.

## 2018-08-06 NOTE — Assessment & Plan Note (Signed)
Chronic problem, well controlled.  Asymptomatic at this time.  Stressed need to stop smoking.  Check labs.  No anticipated med changes.  Will follow.

## 2018-08-07 ENCOUNTER — Other Ambulatory Visit: Payer: Self-pay | Admitting: General Practice

## 2018-08-07 MED ORDER — GLIMEPIRIDE 1 MG PO TABS: 1.0000 mg | ORAL_TABLET | Freq: Every day | ORAL | 1 refills | Status: DC

## 2018-08-07 MED ORDER — ROSUVASTATIN CALCIUM 20 MG PO TABS: 20.0000 mg | ORAL_TABLET | Freq: Every day | ORAL | 1 refills | Status: DC

## 2018-08-07 MED ORDER — ESOMEPRAZOLE MAGNESIUM 40 MG PO CPDR: DELAYED_RELEASE_CAPSULE | ORAL | 1 refills | Status: DC

## 2018-08-07 MED ORDER — LISINOPRIL 20 MG PO TABS: ORAL_TABLET | ORAL | 1 refills | Status: DC

## 2018-09-09 LAB — HM DIABETES EYE EXAM

## 2018-11-09 ENCOUNTER — Other Ambulatory Visit: Payer: Self-pay

## 2018-11-09 ENCOUNTER — Ambulatory Visit: Payer: BLUE CROSS/BLUE SHIELD | Admitting: Family Medicine

## 2018-11-09 ENCOUNTER — Encounter: Payer: Self-pay | Admitting: Family Medicine

## 2018-11-09 ENCOUNTER — Ambulatory Visit (INDEPENDENT_AMBULATORY_CARE_PROVIDER_SITE_OTHER): Payer: BLUE CROSS/BLUE SHIELD | Admitting: Family Medicine

## 2018-11-09 DIAGNOSIS — E1165 Type 2 diabetes mellitus with hyperglycemia: Secondary | ICD-10-CM

## 2018-11-09 MED ORDER — METFORMIN HCL ER 500 MG PO TB24: 1000.0000 mg | ORAL_TABLET | Freq: Two times a day (BID) | ORAL | 1 refills | Status: DC

## 2018-11-09 NOTE — Progress Notes (Signed)
Virtual Visit via Video   I connected with patient on 11/09/18 at  9:40 AM EDT by a video enabled telemedicine application and verified that I am speaking with the correct person using two identifiers.  Location patient: Home Location provider: Acupuncturist, Office Persons participating in the virtual visit: Patient, Provider, Waldo (Jess B)  I discussed the limitations of evaluation and management by telemedicine and the availability of in person appointments. The patient expressed understanding and agreed to proceed.  Interactive audio and video telecommunications were attempted between this provider and patient, however failed, due to patient having technical difficulties OR patient did not have access to video capability.  We continued and completed visit with audio only.   Subjective:   HPI:  DM- chronic problem, on Farxiga 10mg  daily, Amaryl 1mg  daily, Metformin 1000mg  daily, and Trulicity 1.5mg  weekly.  On ACE for renal protection.  UTD on eye exam and foot exam.  Home sugars running ~150.  Denies symptomatic lows.  Denies HAs, visual changes, CP, SOB, abd pain, N/V.  Denies numbness/tingling of hands/feet.  ROS:   See pertinent positives and negatives per HPI.  Patient Active Problem List   Diagnosis Date Noted  . Obesity (BMI 30-39.9) 08/06/2018  . Erectile dysfunction associated with type 2 diabetes mellitus ([place]) 11/27/2015  . Physical exam 08/02/2015  . Tinea pedis of both feet 10/26/2014  . Personal history of colonic adenomas 02/16/2013  . SMOKER 05/26/2009  . REACTIVE AIRWAY DISEASE 05/26/2009  . OBSTRUCTIVE SLEEP APNEA 06/09/2008  . Diabetes mellitus type II, uncontrolled ([place]) 01/05/2007  . Hyperlipidemia associated with type 2 diabetes mellitus ([place]) 01/05/2007  . Essential hypertension 01/05/2007  . GERD, SEVERE 01/05/2007    Social History   Tobacco Use  . Smoking status: Current Every Day Smoker    Packs/day: 3.00    Types: Cigarettes  .  Smokeless tobacco: Never Used  . Tobacco comment: smoked 1977- present, up to 3 ppd  Substance Use Topics  . Alcohol use: Yes    Alcohol/week: 14.0 standard drinks    Types: 14 Cans of beer per week    Comment: Beer    Current Outpatient Medications:  .  clotrimazole-betamethasone (LOTRISONE) cream, APPLY 1 APPLICATION 2 (TWO) TIMES DAILY TOPICALLY., Disp: 45 g, Rfl: 3 .  dapagliflozin propanediol (FARXIGA) 10 MG TABS tablet, Take 10 mg by mouth daily., Disp: 90 tablet, Rfl: 3 .  esomeprazole (NEXIUM) 40 MG capsule, Take 1 tablet by mouth daily., Disp: 90 capsule, Rfl: 1 .  fenofibrate 160 MG tablet, Take 1 tablet (160 mg total) by mouth daily., Disp: 90 tablet, Rfl: 1 .  glimepiride (AMARYL) 1 MG tablet, Take 1 tablet (1 mg total) by mouth daily with breakfast., Disp: 90 tablet, Rfl: 1 .  glucose blood (ONE TOUCH ULTRA TEST) test strip, CHECK BLOOD SUGAR DAILY, Disp: 100 each, Rfl: 12 .  lisinopril (PRINIVIL,ZESTRIL) 20 MG tablet, Take 1/2 tablet by mouth daily., Disp: 45 tablet, Rfl: 1 .  metFORMIN (GLUCOPHAGE-XR) 500 MG 24 hr tablet, Take 2 tablets (1,000 mg total) by mouth daily., Disp: 180 tablet, Rfl: 3 .  ONETOUCH DELICA LANCETS MISC, by Does not apply route. Check blood sugar once daily , Disp: , Rfl:  .  rosuvastatin (CRESTOR) 20 MG tablet, Take 1 tablet (20 mg total) by mouth daily., Disp: 90 tablet, Rfl: 1 .  TRULICITY 1.5 WI/0.9BD SOPN, INJECT 1.5 MG ONCE A WEEK INTO THE SKIN., Disp: 6 pen, Rfl: 3 .  cetirizine (ZYRTEC) 10 MG  tablet, Take 1 tablet (10 mg total) by mouth daily. (Patient not taking: Reported on 11/09/2018), Disp: 30 tablet, Rfl: 11  Allergies  Allergen Reactions  . Pioglitazone     REACTION: edema    Objective:   There were no vitals taken for this visit. Pt is able to speak clearly, coherently without shortness of breath or increased work of breathing.  Thought process is linear.  Mood is appropriate.   Assessment and Plan:   DM- chronic problem.   Tolerating medication w/o difficulty.  UTD on eye exam, foot exam, on ACE for renal protection.  Check labs.  Adjust meds prn    Annye Asa, MD 11/09/2018

## 2018-11-09 NOTE — Progress Notes (Signed)
I have discussed the procedure for the virtual visit with the patient who has given consent to proceed with assessment and treatment.   Shalonda Sachse L Kierra Jezewski, CMA     

## 2018-11-10 ENCOUNTER — Ambulatory Visit: Payer: BLUE CROSS/BLUE SHIELD

## 2018-11-11 ENCOUNTER — Telehealth: Payer: Self-pay | Admitting: Family Medicine

## 2018-11-11 NOTE — Telephone Encounter (Signed)
LM asking pt to call back to schedule his cpe after 02/04/19 with Dr. Birdie Riddle

## 2018-11-18 ENCOUNTER — Other Ambulatory Visit: Payer: Self-pay

## 2018-11-18 ENCOUNTER — Ambulatory Visit (INDEPENDENT_AMBULATORY_CARE_PROVIDER_SITE_OTHER): Payer: BLUE CROSS/BLUE SHIELD

## 2018-11-18 DIAGNOSIS — E1165 Type 2 diabetes mellitus with hyperglycemia: Secondary | ICD-10-CM | POA: Diagnosis not present

## 2018-11-18 LAB — BASIC METABOLIC PANEL
BUN: 13 mg/dL (ref 6–23)
CO2: 30 mEq/L (ref 19–32)
Calcium: 9.8 mg/dL (ref 8.4–10.5)
Chloride: 99 mEq/L (ref 96–112)
Creatinine, Ser: 1.33 mg/dL (ref 0.40–1.50)
GFR: 55.07 mL/min — ABNORMAL LOW (ref >60.00)
Glucose, Bld: 93 mg/dL (ref 70–99)
Potassium: 5.1 mEq/L (ref 3.5–5.1)
Sodium: 135 mEq/L (ref 135–145)

## 2018-11-18 LAB — HEMOGLOBIN A1C: Hgb A1c MFr Bld: 6.3 % (ref 4.6–6.5)

## 2019-02-21 ENCOUNTER — Other Ambulatory Visit: Payer: Self-pay | Admitting: Family Medicine

## 2019-03-31 ENCOUNTER — Other Ambulatory Visit: Payer: Self-pay | Admitting: Family Medicine

## 2019-04-26 ENCOUNTER — Telehealth: Payer: Self-pay | Admitting: Family Medicine

## 2019-04-26 ENCOUNTER — Other Ambulatory Visit: Payer: Self-pay | Admitting: Family Medicine

## 2019-04-26 NOTE — Telephone Encounter (Signed)
Ok to fax med list.

## 2019-04-26 NOTE — Telephone Encounter (Signed)
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

## 2019-04-26 NOTE — Telephone Encounter (Signed)
Ok to fax med list for pt

## 2019-04-26 NOTE — Telephone Encounter (Signed)
Med list printed and faxed.

## 2019-04-26 NOTE — Telephone Encounter (Signed)
Patient needs a copy of medication for DOT physical. Patent is saying because he has HTN and diabetes they are needing a list of his medication. 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 If not, patient will have to pick up list of medication. Please advise.

## 2019-05-03 ENCOUNTER — Other Ambulatory Visit: Payer: Self-pay | Admitting: Endocrinology

## 2019-05-03 NOTE — Telephone Encounter (Signed)
Per Dr. Loanne Drilling, unable to refill Trulicity without an appt. Routing this message to the front desk for scheduling purposes.

## 2019-05-03 NOTE — Telephone Encounter (Signed)
1.  Please schedule f/u appt 2.  Then please refill x 1, pending that appt.  

## 2019-05-03 NOTE — Telephone Encounter (Signed)
Please advise 

## 2019-05-04 ENCOUNTER — Telehealth: Payer: Self-pay

## 2019-05-04 ENCOUNTER — Other Ambulatory Visit: Payer: Self-pay

## 2019-05-04 DIAGNOSIS — E1165 Type 2 diabetes mellitus with hyperglycemia: Secondary | ICD-10-CM

## 2019-05-04 MED ORDER — TRULICITY 1.5 MG/0.5ML ~~LOC~~ SOAJ: 1.5000 mg | SUBCUTANEOUS | 0 refills | Status: DC

## 2019-05-04 NOTE — Telephone Encounter (Signed)
Patient is scheduled for appointment on 05/06/19 at 7:45 a.m.

## 2019-05-04 NOTE — Telephone Encounter (Signed)
PA initiated today. Will await response from insurance re: approval/denial.

## 2019-05-04 NOTE — Telephone Encounter (Signed)
Dulaglutide (TRULICITY) 1.5 0000000 SOPN 2 pen 0 05/04/2019    Sig - Route: Inject 1.5 mg into the skin once a week. - Subcutaneous   Sent to pharmacy as: Dulaglutide (TRULICITY) 1.5 0000000 Solution Pen-injector   E-Prescribing Status: Receipt confirmed by pharmacy (05/04/2019 10:59 AM EST)

## 2019-05-04 NOTE — Telephone Encounter (Signed)
Rx for Trulicity sent but received notification from [facility] that a PA is required. LOV 08/21/17. Scheduled for f/u 05/06/19. Routing this message to Dr. Loanne Drilling to determine if he would like a PA completed OR if he would prefer to wait until appt. Will await response.

## 2019-05-04 NOTE — Telephone Encounter (Signed)
PA initiated today through Cover My Meds for Trulicity. Will await insurance response re: approval/denial.  Merilynn Finland KeyLink Snuffer - Rx #: [id number] Need help? Call us at [phone] Status Sent to [employer] Drug Trulicity 1.5MG /0.5ML pen-injectors Form Charity fundraiser PA Form (NCPDP) Original Claim Info Short (Key: [facility])  Your demographic data has been sent to the plan successfully. They will respond with your clinical questions and you will be notified by email when available within the next business day. You can also check for an update later by opening this request from your dashboard. Please do not fax or call the plan to resubmit this request.  If you need assistance, please chat with CoverMyMeds or call us at [phone].

## 2019-05-05 NOTE — Telephone Encounter (Signed)
Merilynn Finland KeyLink Snuffer - Rx #: [id number] Need help? Call us at [phone] Outcome Additional Information Required Your PA has been resolved, no additional PA is required. For further inquiries please contact the number on the back of the member prescription card. (Message [phone]) Drug Trulicity 1.5MG /0.5ML pen-injectors Form Charity fundraiser PA Form (NCPDP) Original Claim Info [phone]

## 2019-05-06 ENCOUNTER — Encounter: Payer: Self-pay | Admitting: Endocrinology

## 2019-05-06 ENCOUNTER — Other Ambulatory Visit: Payer: Self-pay

## 2019-05-06 ENCOUNTER — Ambulatory Visit: Payer: BLUE CROSS/BLUE SHIELD | Admitting: Endocrinology

## 2019-05-06 ENCOUNTER — Telehealth: Payer: Self-pay

## 2019-05-06 VITALS — BP 110/70 | HR 80 | Ht 67.0 in | Wt 204.2 lb

## 2019-05-06 DIAGNOSIS — E1129 Type 2 diabetes mellitus with other diabetic kidney complication: Secondary | ICD-10-CM | POA: Diagnosis not present

## 2019-05-06 DIAGNOSIS — E1165 Type 2 diabetes mellitus with hyperglycemia: Secondary | ICD-10-CM | POA: Diagnosis not present

## 2019-05-06 LAB — POCT GLYCOSYLATED HEMOGLOBIN (HGB A1C): Hemoglobin A1C: 6.5 % — AB (ref 4.0–5.6)

## 2019-05-06 MED ORDER — OZEMPIC (1 MG/DOSE) 2 MG/1.5ML ~~LOC~~ SOPN: 1.0000 mg | PEN_INJECTOR | SUBCUTANEOUS | 3 refills | Status: DC

## 2019-05-06 MED ORDER — GLIMEPIRIDE 1 MG PO TABS: 0.5000 mg | ORAL_TABLET | Freq: Every day | ORAL | 1 refills | Status: DC

## 2019-05-06 NOTE — Telephone Encounter (Signed)
Please advise what you would like to released to DOT

## 2019-05-06 NOTE — Patient Instructions (Addendum)
check your blood sugar once a day.  vary the time of day when you check, between before the 3 meals, and at bedtime.  also check if you have symptoms of your blood sugar being too high or too low.  please keep a record of the readings and bring it to your next appointment here (or you can bring the meter itself).  You can write it on any piece of paper.  please call us sooner if your blood sugar goes below 70, or if you have a lot of readings over 200. I have sent a prescription to your pharmacy, to change Trulicity to Ozempic, and; Reduce the glimepiride to 1/2 of 1 mg each morning, and:  Please continue the same other diabetes medications.  Please come back for a follow-up appointment in 3 months.

## 2019-05-06 NOTE — Telephone Encounter (Signed)
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

## 2019-05-06 NOTE — Telephone Encounter (Signed)
Chart Review Routing History Since 05/07/2018 (View Full Routing History)  Recipients Sent On Sent By Routed Reports   Department of Transportation     05/06/2019 11:33 AM Catrina Fellenz, LPN POCT GLYCOSYLATED HEMOGLOBIN (HGB A1C) ([id number])     Cover Page Message : At pt request, here is a copy of his most recent A1C

## 2019-05-06 NOTE — Progress Notes (Signed)
Subjective:    Patient ID: Zachary Clay., male    DOB: 03-06-1961, 58 y.o.   MRN: [id number]  HPI Pt returns for f/u of diabetes mellitus: DM type: 2 Dx'ed: [id number] Complications: renal insuff.  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  Interval history: pt states he feels well in general.  He says cbg's are in the 100's.   Past Medical History:  Diagnosis Date   Diabetes mellitus    Hyperlipidemia    Personal history of colonic adenomas 02/16/2013   Polycythemia 2012   Hgb 17.8    Past Surgical History:  Procedure Laterality Date   COLONOSCOPY  02/08/2013   ESOPHAGOGASTRODUODENOSCOPY  2003   [facility]   WISDOM TOOTH EXTRACTION      Social History   Socioeconomic History   Marital status: Married    Spouse name: Not on file   Number of children: Not on file   Years of education: Not on file   Highest education level: Not on file  Occupational History    Employer: [employer]   Financial resource strain: Not on file   Food insecurity    Worry: Not on file    Inability: Not on file   Transportation needs    Medical: Not on file    Non-medical: Not on file  Tobacco Use   Smoking status: Current Every Day Smoker    Packs/day: 3.00    Types: Cigarettes   Smokeless tobacco: Never Used   Tobacco comment: smoked 1977- present, up to 3 ppd  Substance and Sexual Activity   Alcohol use: Yes    Alcohol/week: 14.0 standard drinks    Types: 14 Cans of beer per week    Comment: Beer   Drug use: No   Sexual activity: Not on file  Lifestyle   Physical activity    Days per week: Not on file    Minutes per session: Not on file   Stress: Not on file  Relationships   Social connections    Talks on phone:  Not on file    Gets together: Not on file    Attends religious service: Not on file    Active member of club or organization: Not on file    Attends meetings of clubs or organizations: Not on file    Relationship status: Not on file   Intimate partner violence    Fear of current or ex partner: Not on file    Emotionally abused: Not on file    Physically abused: Not on file    Forced sexual activity: Not on file  Other Topics Concern   Not on file  Social History Narrative   Not on file    Current Outpatient Medications on File Prior to Visit  Medication Sig Dispense Refill   cetirizine (ZYRTEC) 10 MG tablet Take 1 tablet (10 mg total) by mouth daily. 30 tablet 11   clotrimazole-betamethasone (LOTRISONE) cream APPLY 1 APPLICATION 2 (TWO) TIMES DAILY TOPICALLY. 45 g 3   dapagliflozin propanediol (FARXIGA) 10 MG TABS tablet Take 10 mg by mouth daily. 90 tablet 3   esomeprazole (NEXIUM) 40 MG capsule Take 1 capsule by mouth once daily 90 capsule 0   fenofibrate 160 MG tablet  Take 1 tablet (160 mg total) by mouth daily. 90 tablet 1   glucose blood (ONE TOUCH ULTRA TEST) test strip CHECK BLOOD SUGAR DAILY 100 each 12   lisinopril (ZESTRIL) 20 MG tablet Take 1/2 (one-half) tablet by mouth once daily 45 tablet 0   metFORMIN (GLUCOPHAGE-XR) 500 MG 24 hr tablet Take 2 tablets (1,000 mg total) by mouth 2 (two) times a day. 360 tablet 1   ONETOUCH DELICA LANCETS MISC by Does not apply route. Check blood sugar once daily      rosuvastatin (CRESTOR) 20 MG tablet Take 1 tablet (20 mg total) by mouth daily. 90 tablet 1   No current facility-administered medications on file prior to visit.     Allergies  Allergen Reactions   Pioglitazone     REACTION: edema    Family History  Problem Relation Age of Onset   Heart attack Father 44   Diabetes Mother    Melanoma Sister    Diabetes Maternal Grandmother    Stomach cancer Maternal Grandmother    Diabetes Maternal  Grandfather    Stroke Neg Hx    Colon cancer Neg Hx    Esophageal cancer Neg Hx    Rectal cancer Neg Hx    Colon polyps Neg Hx     BP 110/70 (BP Location: Left Arm, Patient Position: Sitting, Cuff Size: Large)    Pulse 80    Ht 5\' 7"  (1.702 m)    Wt 204 lb 3.2 oz (92.6 kg)    SpO2 96%    BMI 31.98 kg/m    Review of Systems He denies hypoglycemia    Objective:   Physical Exam VITAL SIGNS:  See vs page GENERAL: no distress Pulses: dorsalis pedis intact bilat.   MSK: no deformity of the feet CV: no leg edema Skin:  no ulcer on the feet.  normal color and temp on the feet. Neuro: sensation is intact to touch on the feet  Lab Results  Component Value Date   CREATININE 1.33 11/18/2018   BUN 13 11/18/2018   NA 135 11/18/2018   K 5.1 11/18/2018   CL 99 11/18/2018   CO2 30 11/18/2018   Lab Results  Component Value Date   TSH 2.70 08/06/2018   Lab Results  Component Value Date   HGBA1C 6.5 (A) 05/06/2019      Assessment & Plan:  Type 2 DM: overcontrolled, for this SU-containing regimen.   Renal insuff: this is another reason why he needs to minimize SU rx Occupational status: in this setting, he needs to control DM without insulin.  Patient Instructions  check your blood sugar once a day.  vary the time of day when you check, between before the 3 meals, and at bedtime.  also check if you have symptoms of your blood sugar being too high or too low.  please keep a record of the readings and bring it to your next appointment here (or you can bring the meter itself).  You can write it on any piece of paper.  please call us sooner if your blood sugar goes below 70, or if you have a lot of readings over 200. I have sent a prescription to your pharmacy, to change Trulicity to Ozempic, and; Reduce the glimepiride to 1/2 of 1 mg each morning, and:  Please continue the same other diabetes medications.  Please come back for a follow-up appointment in 3 months.

## 2019-05-06 NOTE — Telephone Encounter (Signed)
I assume he means A1c

## 2019-05-30 ENCOUNTER — Other Ambulatory Visit: Payer: Self-pay | Admitting: Family Medicine

## 2019-07-08 ENCOUNTER — Other Ambulatory Visit: Payer: Self-pay | Admitting: Family Medicine

## 2019-07-08 ENCOUNTER — Telehealth: Payer: Self-pay | Admitting: Family Medicine

## 2019-07-08 NOTE — Telephone Encounter (Signed)
Pt requesting refill of Lotrisone Please note this should go to [facility] in [place] ( not [facility] ) [phone]

## 2019-07-08 NOTE — Telephone Encounter (Signed)
Please advise if ok to fill?  

## 2019-07-09 MED ORDER — CLOTRIMAZOLE-BETAMETHASONE 1-0.05 % EX CREA: 1.0000 "application " | TOPICAL_CREAM | Freq: Two times a day (BID) | CUTANEOUS | 3 refills | Status: DC

## 2019-07-09 NOTE — Telephone Encounter (Signed)
Medication filled to pharmacy as requested.   

## 2019-07-09 NOTE — Telephone Encounter (Signed)
Ok to fill 

## 2019-07-23 ENCOUNTER — Other Ambulatory Visit: Payer: Self-pay | Admitting: Family Medicine

## 2019-08-09 ENCOUNTER — Other Ambulatory Visit: Payer: Self-pay

## 2019-08-11 ENCOUNTER — Other Ambulatory Visit: Payer: Self-pay

## 2019-08-11 ENCOUNTER — Encounter: Payer: Self-pay | Admitting: Endocrinology

## 2019-08-11 ENCOUNTER — Ambulatory Visit: Payer: BC Managed Care – PPO | Admitting: Endocrinology

## 2019-08-11 VITALS — BP 120/70 | HR 70 | Ht 67.0 in | Wt 203.0 lb

## 2019-08-11 DIAGNOSIS — E1165 Type 2 diabetes mellitus with hyperglycemia: Secondary | ICD-10-CM

## 2019-08-11 LAB — POCT GLYCOSYLATED HEMOGLOBIN (HGB A1C): Hemoglobin A1C: 7.4 % — AB (ref 4.0–5.6)

## 2019-08-11 MED ORDER — REPAGLINIDE 1 MG PO TABS: 1.0000 mg | ORAL_TABLET | Freq: Two times a day (BID) | ORAL | 3 refills | Status: DC

## 2019-08-11 NOTE — Progress Notes (Signed)
Subjective:    Patient ID: Zachary Clay., male    DOB: 15-Aug-1960, 59 y.o.   MRN: [id number]  HPI Pt returns for f/u of diabetes mellitus: DM type: 2 Dx'ed: [id number] Complications: renal insuff.  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  Other: he had edema with pioglitizone in the past; he is a smoker; abd pain limits metformin dosage.   Interval history: pt states he feels well in general.  He says cbg's are well-controlled.   Past Medical History:  Diagnosis Date  . Diabetes mellitus   . Hyperlipidemia   . Personal history of colonic adenomas 02/16/2013  . Polycythemia 2012   Hgb 17.8    Past Surgical History:  Procedure Laterality Date  . COLONOSCOPY  02/08/2013  . ESOPHAGOGASTRODUODENOSCOPY  2003   [facility]  . WISDOM TOOTH EXTRACTION      Social History   Socioeconomic History  . Marital status: Married    Spouse name: Not on file  . Number of children: Not on file  . Years of education: Not on file  . Highest education level: Not on file  Occupational History    Employer: [employer]  Tobacco Use  . Smoking status: Current Every Day Smoker    Packs/day: 3.00    Types: Cigarettes  . Smokeless tobacco: Never Used  . Tobacco comment: smoked 1977- present, up to 3 ppd  Substance and Sexual Activity  . Alcohol use: Yes    Alcohol/week: 14.0 standard drinks    Types: 14 Cans of beer per week    Comment: Beer  . Drug use: No  . Sexual activity: Not on file  Other Topics Concern  . Not on file  Social History Narrative  . Not on file   Social Determinants of Health   Financial Resource Strain:   . Difficulty of Paying Living Expenses: Not on file  Food Insecurity:   . Worried About Charity fundraiser in the Last Year: Not on file  . Ran Out of Food in the Last Year: Not on file  Transportation Needs:   . Lack of  Transportation (Medical): Not on file  . Lack of Transportation (Non-Medical): Not on file  Physical Activity:   . Days of Exercise per Week: Not on file  . Minutes of Exercise per Session: Not on file  Stress:   . Feeling of Stress : Not on file  Social Connections:   . Frequency of Communication with Friends and Family: Not on file  . Frequency of Social Gatherings with Friends and Family: Not on file  . Attends Religious Services: Not on file  . Active Member of Clubs or Organizations: Not on file  . Attends Archivist Meetings: Not on file  . Marital Status: Not on file  Intimate Partner Violence:   . Fear of Current or Ex-Partner: Not on file  . Emotionally Abused: Not on file  . Physically Abused: Not on file  . Sexually Abused: Not on file    Current Outpatient Medications on File Prior to Visit  Medication Sig Dispense Refill  . cetirizine (ZYRTEC) 10 MG tablet Take 1 tablet (10 mg total) by mouth daily. 30 tablet 11  . clotrimazole-betamethasone (LOTRISONE) cream Apply 1 application topically 2 (two) times daily. 45 g 3  . esomeprazole (NEXIUM) 40 MG capsule Take 1 capsule  by mouth once daily 90 capsule 0  . FARXIGA 10 MG TABS tablet Take 1 tablet by mouth once daily 90 tablet 0  . fenofibrate 160 MG tablet Take 1 tablet (160 mg total) by mouth daily. 90 tablet 1  . glucose blood (ONE TOUCH ULTRA TEST) test strip CHECK BLOOD SUGAR DAILY 100 each 12  . lisinopril (ZESTRIL) 20 MG tablet Take 1/2 (one-half) tablet by mouth once daily 45 tablet 3  . metFORMIN (GLUCOPHAGE-XR) 500 MG 24 hr tablet Take 2 tablets by mouth twice daily 360 tablet 0  . ONETOUCH DELICA LANCETS MISC by Does not apply route. Check blood sugar once daily     . rosuvastatin (CRESTOR) 20 MG tablet Take 1 tablet by mouth once daily 90 tablet 0  . Semaglutide, 1 MG/DOSE, (OZEMPIC, 1 MG/DOSE,) 2 MG/1.5ML SOPN Inject 1 mg into the skin once a week. 3 pen 3   No current facility-administered  medications on file prior to visit.    Allergies  Allergen Reactions  . Pioglitazone     REACTION: edema    Family History  Problem Relation Age of Onset  . Heart attack Father 62  . Diabetes Mother   . Melanoma Sister   . Diabetes Maternal Grandmother   . Stomach cancer Maternal Grandmother   . Diabetes Maternal Grandfather   . Stroke Neg Hx   . Colon cancer Neg Hx   . Esophageal cancer Neg Hx   . Rectal cancer Neg Hx   . Colon polyps Neg Hx     BP 120/70 (BP Location: Left Arm, Patient Position: Sitting, Cuff Size: Large)   Pulse 70   Ht 5\' 7"  (1.702 m)   Wt 203 lb (92.1 kg)   SpO2 97%   BMI 31.79 kg/m    Review of Systems He denies hypoglycemia.      Objective:   Physical Exam VITAL SIGNS:  See vs page GENERAL: no distress Pulses: dorsalis pedis intact bilat.   MSK: no deformity of the feet CV: no leg edema Skin:  no ulcer on the feet.  normal color and temp on the feet. Neuro: sensation is intact to touch on the feet  Lab Results  Component Value Date   HGBA1C 7.4 (A) 08/11/2019       Assessment & Plan:  Type 2 DM: he would benefit from increased rx, if it can be done with a regimen that avoids or minimizes hypoglycemia. Occupational state: he needs to control DM without insulin.   Patient Instructions  check your blood sugar once a day.  vary the time of day when you check, between before the 3 meals, and at bedtime.  also check if you have symptoms of your blood sugar being too high or too low.  please keep a record of the readings and bring it to your next appointment here (or you can bring the meter itself).  You can write it on any piece of paper.  please call us sooner if your blood sugar goes below 70, or if you have a lot of readings over 200. I have sent a prescription to your pharmacy, to change the glimepiride to repaglinide Please continue the same other diabetes medications.  Please come back for a follow-up appointment in 2-3 months.

## 2019-08-11 NOTE — Patient Instructions (Addendum)
check your blood sugar once a day.  vary the time of day when you check, between before the 3 meals, and at bedtime.  also check if you have symptoms of your blood sugar being too high or too low.  please keep a record of the readings and bring it to your next appointment here (or you can bring the meter itself).  You can write it on any piece of paper.  please call us sooner if your blood sugar goes below 70, or if you have a lot of readings over 200. I have sent a prescription to your pharmacy, to change the glimepiride to repaglinide Please continue the same other diabetes medications.  Please come back for a follow-up appointment in 2-3 months.

## 2019-09-08 ENCOUNTER — Other Ambulatory Visit: Payer: Self-pay | Admitting: Family Medicine

## 2019-09-20 ENCOUNTER — Telehealth: Payer: Self-pay

## 2019-09-20 NOTE — Telephone Encounter (Signed)
PRIOR AUTHORIZATION  PA initiation date: 09/20/19  Medication: Ozempic 1mg  Insurance Company: Research officer, [employer] completed electronically through [employer] My Meds: Yes  Will await insurance response re: approval/denial.  Zachary Clay (Key: [id number])  [facility] is processing your PA request and will respond shortly with next steps. You are currently using the fastest method to process this prior authorization. Please do not fax or call [employer] to resubmit this request. To check for an update later, open this request again from your dashboard.  Zachary Clay (Key: [id number]) Rx #: [id number] Ozempic (1 MG/DOSE) 2MG /1.5ML pen-injectors   Form Charity fundraiser PA Form [phone] NCPDP) Created 17 minutes ago Sent to Plan 2 minutes ago Determination Wait for Questions [employer] NCPDP 2017 typically responds with questions in less than 15 minutes, but may take up to 24 hours.

## 2019-09-21 ENCOUNTER — Other Ambulatory Visit: Payer: Self-pay | Admitting: Family Medicine

## 2019-09-21 NOTE — Telephone Encounter (Signed)
APPROVAL  Medication: Ozempic 1mg  Insurance Company: Film/video editor PA response: APPROVED  PAU ZELDIN (Key: [id number]) Rx #[id number] Ozempic (1 MG/DOSE) 2MG /1.5ML pen-injectors   Form Charity fundraiser PA Form (2017 NCPDP) Created 1 day ago Sent to Plan 1 day ago Plan Response 22 hours ago Submit Clinical Questions Determination N/A Message from Plan Your PA has been resolved, no additional PA is required. For further inquiries please contact the number on the back of the member prescription card. (Message [phone])

## 2019-09-22 DIAGNOSIS — K591 Functional diarrhea: Secondary | ICD-10-CM | POA: Diagnosis not present

## 2019-09-25 DIAGNOSIS — R197 Diarrhea, unspecified: Secondary | ICD-10-CM | POA: Diagnosis not present

## 2019-09-29 NOTE — Telephone Encounter (Signed)
Returned pt call. Advised his Rx benefit plan ONLY allows for 30 days of Ozempic NOT a 90 day supply. Pt expressed confusion, stating that his insurance usually wants 90 days on everything. Education provided that that is not always the case for every medication thus the need for a PA. Approval from insurance is ONLY allowable for 30 days of Ozempic. Advised if [facility] is not willing to fill for 90 days, we are happy to send Rx elsewhere. Pt stated, "well [facility]". Advised he was misinformed. His PA for Ozempic was processed under his pharmacy benefit plan, [facility]. That DOES NOT mean [facility] must [facility] to [facility]. Advised his Rx WAS sent to [facility] but CAN be sent elsewhere. Pt expressed understanding. No further action required.

## 2019-09-29 NOTE — Telephone Encounter (Signed)
Cover my meds called stating PA is NOT needed for Ozempic 1mg  - pharmacy cannot fill prescription due to the way to prescription is written. RX needs to be a 90 day supply instead of 30 day.   [phone] (ref# [id number])

## 2019-09-29 NOTE — Telephone Encounter (Signed)
30 days at a time is fine

## 2019-09-29 NOTE — Telephone Encounter (Signed)
PA was approved for 30 day supply NOT 90 day. Please advise if you want Rx sent to alternate pharmacy. If Rx is changed to a 90 day supply, another PA will need to be completed.

## 2019-10-05 MED ORDER — OZEMPIC (1 MG/DOSE) 2 MG/1.5ML ~~LOC~~ SOPN: 1.0000 mg | PEN_INJECTOR | SUBCUTANEOUS | 3 refills | Status: DC

## 2019-10-05 NOTE — Telephone Encounter (Signed)
Once again, another PA has been initiated AND APPROVED as indicated below:  Asencion Gowda JR (Key: [id number]) Ozempic (1 MG/DOSE) 2MG /1.5ML pen-injectors   Form Charity fundraiser PA Form (2017 [employer]) Created 4 minutes ago Sent to Plan less than a minute ago Plan Response less than a minute ago Submit Clinical Questions Determination N/A Message from Plan Your PA has been resolved, no additional PA is required. For further inquiries please contact the number on the back of the member prescription card. (Message [phone])  Called and spoke with [facility] with [place]. Issue is NOT related to PA. In fact, according to [facility], Rx written 04/2019 has expired and is just in needed of a current Rx. As indicated below, Rx has been re-sent:  Outpatient Medication Detail   Disp Refills Start End   Semaglutide, 1 MG/DOSE, (OZEMPIC, 1 MG/DOSE,) 2 MG/1.5ML SOPN 3 pen 3 10/05/2019    Sig - Route: Inject 1 mg into the skin once a week. - Subcutaneous   Sent to pharmacy as: Semaglutide, 1 MG/DOSE, (OZEMPIC, 1 MG/DOSE,) 2 MG/1.5ML Solution Pen-injector   E-Prescribing Status: Receipt confirmed by pharmacy (10/05/2019 1:32 PM EDT)    [facility] Mam confirmed receipt of Rx above. States Rx has been processed and is ready for pick up. No further action required from this office.

## 2019-10-05 NOTE — Addendum Note (Signed)
Addended by: Hardie Pulley, Auda Finfrock J on: 10/05/2019 01:35 PM   Modules accepted: Orders

## 2019-10-05 NOTE — Telephone Encounter (Signed)
Wife, Shun Maruna called to advise that she has spoken with both [employer] and the pharmacy line for [facility] have both advised her that they will fill and will "only" fill a 90 day refill. We can call [phone] to verify or confirm benefits to support 90 day fill on Ozempic.  Wife's number for additional information, clarification, etc is [phone]

## 2019-10-20 ENCOUNTER — Other Ambulatory Visit: Payer: Self-pay | Admitting: Family Medicine

## 2019-10-20 ENCOUNTER — Telehealth: Payer: Self-pay

## 2019-10-20 NOTE — Telephone Encounter (Signed)
Patient called states that he has diarrhea and vomiting since starting the Ozempic back.   He had 1 shot last Monday and 1 shot this Monday and has been sick ever since.  He went to the Urgent care about 3 weeks ago and did stool samples and they were negative.  He did not have any issues with Ozempic in November when he started it.   He is wondering if it is the Ozempic making him sick.  Please review and advise.  Thank you

## 2019-10-20 NOTE — Telephone Encounter (Signed)
Called pt and advised to stop Ozempic. Verbalized acceptance and understanding. Appt rescheduled as follows:  Next Appt With Internal Medicine Renato Shin, MD) 10/21/2019 at 8:45 AM

## 2019-10-20 NOTE — Telephone Encounter (Signed)
Please advise 

## 2019-10-20 NOTE — Telephone Encounter (Signed)
D/c Ozempic Please move up next appt to next avail.

## 2019-10-21 ENCOUNTER — Telehealth: Payer: Self-pay

## 2019-10-21 ENCOUNTER — Ambulatory Visit: Payer: BC Managed Care – PPO | Admitting: Endocrinology

## 2019-10-21 ENCOUNTER — Other Ambulatory Visit: Payer: Self-pay

## 2019-10-21 ENCOUNTER — Encounter: Payer: Self-pay | Admitting: Endocrinology

## 2019-10-21 DIAGNOSIS — E1165 Type 2 diabetes mellitus with hyperglycemia: Secondary | ICD-10-CM | POA: Diagnosis not present

## 2019-10-21 LAB — BASIC METABOLIC PANEL
BUN: 20 mg/dL (ref 6–23)
CO2: 32 mEq/L (ref 19–32)
Calcium: 10.4 mg/dL (ref 8.4–10.5)
Chloride: 98 mEq/L (ref 96–112)
Creatinine, Ser: 1.17 mg/dL (ref 0.40–1.50)
GFR: 63.65 mL/min (ref >60.00)
Glucose, Bld: 106 mg/dL — ABNORMAL HIGH (ref 70–99)
Potassium: 5.4 mEq/L — ABNORMAL HIGH (ref 3.5–5.1)
Sodium: 137 mEq/L (ref 135–145)

## 2019-10-21 LAB — POCT GLYCOSYLATED HEMOGLOBIN (HGB A1C): Hemoglobin A1C: 8.1 % — AB (ref 4.0–5.6)

## 2019-10-21 LAB — TSH: TSH: 1.34 u[IU]/mL (ref 0.35–4.50)

## 2019-10-21 MED ORDER — REPAGLINIDE 2 MG PO TABS: 2.0000 mg | ORAL_TABLET | Freq: Three times a day (TID) | ORAL | 3 refills | Status: DC

## 2019-10-21 MED ORDER — OZEMPIC (0.25 OR 0.5 MG/DOSE) 2 MG/1.5ML ~~LOC~~ SOPN: 0.5000 mg | PEN_INJECTOR | SUBCUTANEOUS | 11 refills | Status: DC

## 2019-10-21 NOTE — Telephone Encounter (Signed)
-----   Message from Renato Shin, MD sent at 10/21/2019  2:52 PM EDT ----- please contact patient: Potassium is slightly high.  Please see Dr Birdie Riddle, to follow up.

## 2019-10-21 NOTE — Patient Instructions (Addendum)
check your blood sugar once a day.  vary the time of day when you check, between before the 3 meals, and at bedtime.  also check if you have symptoms of your blood sugar being too high or too low.  please keep a record of the readings and bring it to your next appointment here (or you can bring the meter itself).  You can write it on any piece of paper.  please call us sooner if your blood sugar goes below 70, or if you have a lot of readings over 200. I have sent a prescription to your pharmacy, to double the repaglinide, and half the Ozempic. If the kidneys are off again today, we'll half the metformin.  Please come back for a follow-up appointment in 2 months.

## 2019-10-21 NOTE — Telephone Encounter (Signed)
LAB RESULTS  Lab results were reviewed by Dr. Loanne Drilling. Called pt to inform about lab results as well as recommendation. Using closed-loop communication, pt verbalized complete acceptance and understanding of all information provided. No further questions nor concerns were voiced at this time.

## 2019-10-21 NOTE — Progress Notes (Signed)
Subjective:    Patient ID: Zachary Levee., male    DOB: Dec 04, 1960, 59 y.o.   MRN: [id number]  HPI Pt returns for f/u of diabetes mellitus: DM type: 2 Dx'ed: [id number] Complications: CRI.  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  Other: he had edema with pioglitizone in the past; he is a smoker; abd pain limits metformin dosage.   Interval history: He took no meds x 2 days, due to n/v/d.  cbg increased to 400 yesterday.  GI sxs are better now.  Prior to the illness, cbg's were in the 100's.  Past Medical History:  Diagnosis Date  . Diabetes mellitus   . Hyperlipidemia   . Personal history of colonic adenomas 02/16/2013  . Polycythemia 2012   Hgb 17.8    Past Surgical History:  Procedure Laterality Date  . COLONOSCOPY  02/08/2013  . ESOPHAGOGASTRODUODENOSCOPY  2003   [facility]  . WISDOM TOOTH EXTRACTION      Social History   Socioeconomic History  . Marital status: Married    Spouse name: Not on file  . Number of children: Not on file  . Years of education: Not on file  . Highest education level: Not on file  Occupational History    Employer: [employer]  Tobacco Use  . Smoking status: Current Every Day Smoker    Packs/day: 3.00    Types: Cigarettes  . Smokeless tobacco: Never Used  . Tobacco comment: smoked 1977- present, up to 3 ppd  Substance and Sexual Activity  . Alcohol use: Yes    Alcohol/week: 14.0 standard drinks    Types: 14 Cans of beer per week    Comment: Beer  . Drug use: No  . Sexual activity: Not on file  Other Topics Concern  . Not on file  Social History Narrative  . Not on file   Social Determinants of Health   Financial Resource Strain:   . Difficulty of Paying Living Expenses:   Food Insecurity:   . Worried About Charity fundraiser in the Last Year:   . Arboriculturist in the Last Year:    Transportation Needs:   . Film/video editor (Medical):   Marland Kitchen Lack of Transportation (Non-Medical):   Physical Activity:   . Days of Exercise per Week:   . Minutes of Exercise per Session:   Stress:   . Feeling of Stress :   Social Connections:   . Frequency of Communication with Friends and Family:   . Frequency of Social Gatherings with Friends and Family:   . Attends Religious Services:   . Active Member of Clubs or Organizations:   . Attends Archivist Meetings:   Marland Kitchen Marital Status:   Intimate Partner Violence:   . Fear of Current or Ex-Partner:   . Emotionally Abused:   Marland Kitchen Physically Abused:   . Sexually Abused:     Current Outpatient Medications on File Prior to Visit  Medication Sig Dispense Refill  . cetirizine (ZYRTEC) 10 MG tablet Take 1 tablet (10 mg total) by mouth daily. 30 tablet 11  . clotrimazole-betamethasone (LOTRISONE) cream APPLY 1 APPLICATION 2 (TWO) TIMES DAILY TOPICALLY. 135 g 1  . esomeprazole (NEXIUM) 40 MG capsule Take 1 capsule by mouth once daily 90 capsule 0  . FARXIGA 10 MG TABS tablet Take 1 tablet by mouth  once daily 90 tablet 0  . fenofibrate 160 MG tablet Take 1 tablet (160 mg total) by mouth daily. 90 tablet 1  . glucose blood (ONE TOUCH ULTRA TEST) test strip CHECK BLOOD SUGAR DAILY 100 each 12  . lisinopril (ZESTRIL) 20 MG tablet Take 1/2 (one-half) tablet by mouth once daily 45 tablet 3  . metFORMIN (GLUCOPHAGE-XR) 500 MG 24 hr tablet Take 2 tablets by mouth twice daily 360 tablet 0  . ONETOUCH DELICA LANCETS MISC by Does not apply route. Check blood sugar once daily     . rosuvastatin (CRESTOR) 20 MG tablet Take 1 tablet by mouth once daily 90 tablet 0   No current facility-administered medications on file prior to visit.    Allergies  Allergen Reactions  . Pioglitazone     REACTION: edema    Family History  Problem Relation Age of Onset  . Heart attack Father 31  . Diabetes Mother   . Melanoma Sister   . Diabetes  Maternal Grandmother   . Stomach cancer Maternal Grandmother   . Diabetes Maternal Grandfather   . Stroke Neg Hx   . Colon cancer Neg Hx   . Esophageal cancer Neg Hx   . Rectal cancer Neg Hx   . Colon polyps Neg Hx     BP 110/70   Pulse 70   Ht 5\' 7"  (1.702 m)   Wt 191 lb (86.6 kg)   SpO2 95%   BMI 29.91 kg/m    Review of Systems He has chronic nausea    Objective:   Physical Exam VITAL SIGNS:  See vs page GENERAL: no distress Pulses: dorsalis pedis intact bilat.   MSK: no deformity of the feet CV: no leg edema Skin:  no ulcer on the feet.  normal color and temp on the feet. Neuro: sensation is intact to touch on the feet  Lab Results  Component Value Date   HGBA1C 8.1 (A) 10/21/2019   Lab Results  Component Value Date   CREATININE 1.33 11/18/2018   BUN 13 11/18/2018   NA 135 11/18/2018   K 5.1 11/18/2018   CL 99 11/18/2018   CO2 30 11/18/2018   Lab Results  Component Value Date   TSH 2.70 08/06/2018       Assessment & Plan:  Type 2 DM: worse CRI: recheck today.  Nausea, due to Ozempic  Patient Instructions  check your blood sugar once a day.  vary the time of day when you check, between before the 3 meals, and at bedtime.  also check if you have symptoms of your blood sugar being too high or too low.  please keep a record of the readings and bring it to your next appointment here (or you can bring the meter itself).  You can write it on any piece of paper.  please call us sooner if your blood sugar goes below 70, or if you have a lot of readings over 200. I have sent a prescription to your pharmacy, to double the repaglinide, and half the Ozempic. If the kidneys are off again today, we'll half the metformin.  Please come back for a follow-up appointment in 2 months.

## 2019-10-27 ENCOUNTER — Other Ambulatory Visit: Payer: Self-pay

## 2019-10-27 ENCOUNTER — Ambulatory Visit: Payer: BC Managed Care – PPO | Admitting: Family Medicine

## 2019-10-27 ENCOUNTER — Encounter: Payer: Self-pay | Admitting: Family Medicine

## 2019-10-27 VITALS — BP 126/76 | HR 78 | Temp 98.0°F | Resp 16 | Ht 67.0 in | Wt 189.4 lb

## 2019-10-27 DIAGNOSIS — E1165 Type 2 diabetes mellitus with hyperglycemia: Secondary | ICD-10-CM

## 2019-10-27 DIAGNOSIS — E875 Hyperkalemia: Secondary | ICD-10-CM | POA: Diagnosis not present

## 2019-10-27 DIAGNOSIS — E669 Obesity, unspecified: Secondary | ICD-10-CM | POA: Diagnosis not present

## 2019-10-27 LAB — BASIC METABOLIC PANEL
BUN: 23 mg/dL (ref 6–23)
CO2: 31 mEq/L (ref 19–32)
Calcium: 10 mg/dL (ref 8.4–10.5)
Chloride: 100 mEq/L (ref 96–112)
Creatinine, Ser: 1.1 mg/dL (ref 0.40–1.50)
GFR: 68.34 mL/min (ref >60.00)
Glucose, Bld: 119 mg/dL — ABNORMAL HIGH (ref 70–99)
Potassium: 4.2 mEq/L (ref 3.5–5.1)
Sodium: 137 mEq/L (ref 135–145)

## 2019-10-27 NOTE — Patient Instructions (Addendum)
Please schedule your complete physical in 3-4 months We'll notify you of your lab results and make any changes if needed Be sure to drink plenty of water Please schedule an eye exam at your convenience Call with any questions or concerns [place]!

## 2019-10-27 NOTE — Progress Notes (Signed)
   Subjective:    Patient ID: Zachary Clay., male    DOB: 1961-01-05, 59 y.o.   MRN: [id number]  HPI Hyperkalemia- pt's most recent K+ 5.6 at Endo.  Was told to follow up here.  No CP, leg cramps, no N/V (except when he restarted his Ozempic)  DM- chronic problem, saw Endo on 4/29.  Due for eye exam and foot exam.  Was out of medication for ~2 months due to insurance  Obesity- pt is down 14 lbs since Feb.  'i've been eating like I need to'.  Review of Systems For ROS see HPI   This visit occurred during the SARS-CoV-2 public health emergency.  Safety protocols were in place, including screening questions prior to the visit, additional usage of staff PPE, and extensive cleaning of exam room while observing appropriate contact time as indicated for disinfecting solutions.       Objective:   Physical Exam Vitals reviewed.  Constitutional:      General: He is not in acute distress.    Appearance: He is well-developed.     Comments: Smells of cigarettes  HENT:     Head: Normocephalic and atraumatic.  Eyes:     Conjunctiva/sclera: Conjunctivae normal.     Pupils: Pupils are equal, round, and reactive to light.  Neck:     Thyroid: No thyromegaly.  Cardiovascular:     Rate and Rhythm: Normal rate and regular rhythm.     Heart sounds: Normal heart sounds. No murmur.  Pulmonary:     Effort: Pulmonary effort is normal. No respiratory distress.     Breath sounds: Normal breath sounds.  Abdominal:     General: Bowel sounds are normal. There is no distension.     Palpations: Abdomen is soft.  Musculoskeletal:     Cervical back: Normal range of motion and neck supple.  Lymphadenopathy:     Cervical: No cervical adenopathy.  Skin:    General: Skin is warm and dry.  Neurological:     Mental Status: He is alert and oriented to person, place, and time.     Cranial Nerves: No cranial nerve deficit.  Psychiatric:        Behavior: Behavior normal.           Assessment & Plan:    Hyperkalemia- new.  Pt's K+ 5.6 at recent Endo appt.  Currently asymptomatic.  EKG w/o acute or concerning findings.  Repeat BMP today.  If K+ is again elevated will need to hold ACE or decrease dose.

## 2019-10-27 NOTE — Assessment & Plan Note (Signed)
Pt is down 14 lbs.  This may be due to higher sugars (lack of medication) but given his ongoing smoking, we need to monitor his weight and if unexplained weight loss continues, will need additional workup.  Pt expressed understanding and is in agreement w/ plan.

## 2019-10-27 NOTE — Assessment & Plan Note (Signed)
Chronic problem.  Foot exam done today.  Encouraged him to schedule eye exam.

## 2019-11-01 ENCOUNTER — Ambulatory Visit: Payer: BC Managed Care – PPO | Admitting: Endocrinology

## 2019-11-06 ENCOUNTER — Other Ambulatory Visit: Payer: Self-pay | Admitting: Family Medicine

## 2019-11-08 ENCOUNTER — Telehealth: Payer: Self-pay | Admitting: Family Medicine

## 2019-11-08 ENCOUNTER — Other Ambulatory Visit: Payer: Self-pay

## 2019-11-08 DIAGNOSIS — E1165 Type 2 diabetes mellitus with hyperglycemia: Secondary | ICD-10-CM

## 2019-11-08 MED ORDER — GLUCOSE BLOOD VI STRP
1.0000 | ORAL_STRIP | Freq: Every day | 0 refills | Status: AC
Start: 2019-11-08 — End: ?

## 2019-11-08 MED ORDER — METFORMIN HCL ER 500 MG PO TB24: 500.0000 mg | ORAL_TABLET | Freq: Every day | ORAL | 0 refills | Status: DC

## 2019-11-08 NOTE — Telephone Encounter (Signed)
Please advise 

## 2019-11-08 NOTE — Telephone Encounter (Signed)
please contact patient: Ok to stay off the [place].  Why don't you reduce the metformin to 1 pill per day, to see if this helps?

## 2019-11-08 NOTE — Telephone Encounter (Signed)
Pt called in stating that he would like the Metformin to something else. He states that it is keeping his stomach upset. He also wanted Tabori to know that he stopped the Ozempic about 2wks ago and he states that his blood sugar has been running well. He would also like a refill on the onetouch lancets. Pt can be reached at the home #

## 2019-11-08 NOTE — Telephone Encounter (Signed)
Dr Loanne Drilling started his Ozempic and has been managing the diabetes.  If he wants to change the Metformin, I would like for him to discuss this w/ Endo since his last A1C was 8.1.  Please let pt know to reach out to Endo and I will forward this message to Dr Loanne Drilling for review.

## 2019-11-08 NOTE — Telephone Encounter (Signed)
Per Dr.'s Birdie Riddle and Loanne Drilling, called pt and informed of Dr. Cordelia Pen orders. Also advised per Dr. Virgil Benedict request, to please call our office with concerns regarding his diabetic medications or CBG's as his diabetes is being managed by Dr. Loanne Drilling. Pt verbalized acceptance and understanding.

## 2019-12-11 ENCOUNTER — Other Ambulatory Visit: Payer: Self-pay | Admitting: Family Medicine

## 2019-12-23 ENCOUNTER — Encounter: Payer: Self-pay | Admitting: Endocrinology

## 2019-12-23 ENCOUNTER — Other Ambulatory Visit: Payer: Self-pay

## 2019-12-23 ENCOUNTER — Ambulatory Visit: Payer: BC Managed Care – PPO | Admitting: Endocrinology

## 2019-12-23 VITALS — BP 116/60 | HR 64 | Ht 67.0 in | Wt 193.0 lb

## 2019-12-23 DIAGNOSIS — E1165 Type 2 diabetes mellitus with hyperglycemia: Secondary | ICD-10-CM

## 2019-12-23 LAB — POCT GLYCOSYLATED HEMOGLOBIN (HGB A1C): Hemoglobin A1C: 7.4 % — AB (ref 4.0–5.6)

## 2019-12-23 MED ORDER — SITAGLIPTIN PHOSPHATE 100 MG PO TABS
100.0000 mg | ORAL_TABLET | Freq: Every day | ORAL | 3 refills | Status: DC
Start: 2019-12-23 — End: 2020-12-12

## 2019-12-23 MED ORDER — REPAGLINIDE 2 MG PO TABS: 2.0000 mg | ORAL_TABLET | Freq: Three times a day (TID) | ORAL | 3 refills | Status: DC

## 2019-12-23 NOTE — Patient Instructions (Addendum)
check your blood sugar once a day.  vary the time of day when you check, between before the 3 meals, and at bedtime.  also check if you have symptoms of your blood sugar being too high or too low.  please keep a record of the readings and bring it to your next appointment here (or you can bring the meter itself).  You can write it on any piece of paper.  please call us sooner if your blood sugar goes below 70, or if you have a lot of readings over 200. I have sent a prescription to your pharmacy, to add Januvia. Please come back for a follow-up appointment in 3 months.

## 2019-12-23 NOTE — Progress Notes (Signed)
Subjective:    Patient ID: Zachary Clay., male    DOB: June 14, 1961, 59 y.o.   MRN: [id number]  HPI Pt returns for f/u of diabetes mellitus: DM type: 2 Dx'ed: 7371 Complications: CRI.  Therapy: 3 oral meds.   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  Other: he had edema with pioglitizone in the past; he is a smoker; abd pain limits metformin dosage.   Interval history: no cbg record, but states cbg's were in the 100's. He stopped Ozempic, due to nausea.  Past Medical History:  Diagnosis Date   Diabetes mellitus    Hyperlipidemia    Personal history of colonic adenomas 02/16/2013   Polycythemia 2012   Hgb 17.8    Past Surgical History:  Procedure Laterality Date   COLONOSCOPY  02/08/2013   ESOPHAGOGASTRODUODENOSCOPY  2003   [facility]   WISDOM TOOTH EXTRACTION      Social History   Socioeconomic History   Marital status: Married    Spouse name: Not on file   Number of children: Not on file   Years of education: Not on file   Highest education level: Not on file  Occupational History    Employer: [employer]  Tobacco Use   Smoking status: Current Every Day Smoker    Packs/day: 3.00    Types: Cigarettes   Smokeless tobacco: Never Used   Tobacco comment: smoked 1977- present, up to 3 ppd  Vaping Use   Vaping Use: Never used  Substance and Sexual Activity   Alcohol use: Yes    Alcohol/week: 14.0 standard drinks    Types: 14 Cans of beer per week    Comment: Beer   Drug use: No   Sexual activity: Not on file  Other Topics Concern   Not on file  Social History Narrative   Not on file   Social Determinants of Health   Financial Resource Strain:    Difficulty of Paying Living Expenses:   Food Insecurity:    Worried About Charity fundraiser in the Last Year:    Arboriculturist in the Last Year:   Transportation Needs:    Lexicographer (Medical):    Lack of Transportation (Non-Medical):   Physical Activity:    Days of Exercise per Week:    Minutes of Exercise per Session:   Stress:    Feeling of Stress :   Social Connections:    Frequency of Communication with Friends and Family:    Frequency of Social Gatherings with Friends and Family:    Attends Religious Services:    Active Member of Clubs or Organizations:    Attends Music therapist:    Marital Status:   Intimate Partner Violence:    Fear of Current or Ex-Partner:    Emotionally Abused:    Physically Abused:    Sexually Abused:     Current Outpatient Medications on File Prior to Visit  Medication Sig Dispense Refill   cetirizine (ZYRTEC) 10 MG tablet Take 1 tablet (10 mg total) by mouth daily. 30 tablet 11   clotrimazole-betamethasone (LOTRISONE) cream APPLY 1 APPLICATION 2 (TWO) TIMES DAILY TOPICALLY. 135 g 1   esomeprazole (NEXIUM) 40 MG capsule Take 1 capsule by mouth once daily 90 capsule 0   FARXIGA 10 MG TABS tablet Take 1 tablet by mouth once daily 90 tablet 0  fenofibrate 160 MG tablet Take 1 tablet by mouth once daily 90 tablet 0   glucose blood (ONE TOUCH ULTRA TEST) test strip 1 each by Other route daily. E11.9 100 each 0   lisinopril (ZESTRIL) 20 MG tablet Take 1/2 (one-half) tablet by mouth once daily 45 tablet 3   metFORMIN (GLUCOPHAGE-XR) 500 MG 24 hr tablet Take 1 tablet (500 mg total) by mouth daily. "NO PRINT" IS NOT AN ERROR. RX HAS BEEN REDUCED AS OF 11/08/19. DECLINED NEED FOR REFILLS 360 tablet 0   ONETOUCH DELICA LANCETS MISC by Does not apply route. Check blood sugar once daily      rosuvastatin (CRESTOR) 20 MG tablet Take 1 tablet by mouth once daily 90 tablet 0   No current facility-administered medications on file prior to visit.    Allergies  Allergen Reactions   Pioglitazone     REACTION: edema    Family History  Problem Relation Age of Onset   Heart attack Father  74   Diabetes Mother    Melanoma Sister    Diabetes Maternal Grandmother    Stomach cancer Maternal Grandmother    Diabetes Maternal Grandfather    Stroke Neg Hx    Colon cancer Neg Hx    Esophageal cancer Neg Hx    Rectal cancer Neg Hx    Colon polyps Neg Hx     BP 116/60    Pulse 64    Ht 5\' 7"  (1.702 m)    Wt 193 lb (87.5 kg)    SpO2 98%    BMI 30.23 kg/m    Review of Systems Denies nausea    Objective:   Physical Exam VITAL SIGNS:  See vs page GENERAL: no distress Pulses: dorsalis pedis intact bilat.   MSK: no deformity of the feet CV: no leg edema Skin:  no ulcer on the feet.  normal color and temp on the feet. Neuro: sensation is intact to touch on the feet  A1c=7.4%     Assessment & Plan:  Type 2 DM, with CRI: he would benefit from increased rx, if it can be done with a regimen that avoids or minimizes hypoglycemia.    Patient Instructions  check your blood sugar once a day.  vary the time of day when you check, between before the 3 meals, and at bedtime.  also check if you have symptoms of your blood sugar being too high or too low.  please keep a record of the readings and bring it to your next appointment here (or you can bring the meter itself).  You can write it on any piece of paper.  please call us sooner if your blood sugar goes below 70, or if you have a lot of readings over 200. I have sent a prescription to your pharmacy, to add Januvia. Please come back for a follow-up appointment in 3 months.

## 2020-01-29 ENCOUNTER — Other Ambulatory Visit: Payer: Self-pay | Admitting: Family Medicine

## 2020-01-31 NOTE — Telephone Encounter (Signed)
Pt currently sees Dr> Loanne Drilling for DM

## 2020-01-31 NOTE — Telephone Encounter (Signed)
Please advise if this request should be forwarded to PCP

## 2020-01-31 NOTE — Telephone Encounter (Signed)
Rosuvastatin: Please forward refill request to pt's primary care provider.  Farxiga: Please refill prn

## 2020-02-03 ENCOUNTER — Other Ambulatory Visit: Payer: Self-pay | Admitting: General Practice

## 2020-02-03 MED ORDER — ROSUVASTATIN CALCIUM 20 MG PO TABS: 20.0000 mg | ORAL_TABLET | Freq: Every day | ORAL | 0 refills | Status: DC

## 2020-02-14 ENCOUNTER — Encounter: Payer: Self-pay | Admitting: Family Medicine

## 2020-02-14 ENCOUNTER — Ambulatory Visit (INDEPENDENT_AMBULATORY_CARE_PROVIDER_SITE_OTHER): Payer: BC Managed Care – PPO | Admitting: Family Medicine

## 2020-02-14 ENCOUNTER — Other Ambulatory Visit: Payer: Self-pay

## 2020-02-14 VITALS — BP 121/81 | HR 67 | Temp 97.5°F | Resp 16 | Ht 67.0 in | Wt 210.0 lb

## 2020-02-14 DIAGNOSIS — Z0000 Encounter for general adult medical examination without abnormal findings: Secondary | ICD-10-CM

## 2020-02-14 DIAGNOSIS — E785 Hyperlipidemia, unspecified: Secondary | ICD-10-CM

## 2020-02-14 DIAGNOSIS — Z125 Encounter for screening for malignant neoplasm of prostate: Secondary | ICD-10-CM | POA: Diagnosis not present

## 2020-02-14 DIAGNOSIS — E1169 Type 2 diabetes mellitus with other specified complication: Secondary | ICD-10-CM

## 2020-02-14 LAB — CBC WITH DIFFERENTIAL/PLATELET
Basophils Absolute: 0 10*3/uL (ref 0.0–0.1)
Basophils Relative: 0.6 % (ref 0.0–3.0)
Eosinophils Absolute: 0.2 10*3/uL (ref 0.0–0.7)
Eosinophils Relative: 2.9 % (ref 0.0–5.0)
HCT: 48.7 % (ref 39.0–52.0)
Hemoglobin: 16.3 g/dL (ref 13.0–17.0)
Lymphocytes Relative: 24.1 % (ref 12.0–46.0)
Lymphs Abs: 1.7 10*3/uL (ref 0.7–4.0)
MCHC: 33.4 g/dL (ref 30.0–36.0)
MCV: 89.5 fl (ref 78.0–100.0)
Monocytes Absolute: 0.7 10*3/uL (ref 0.1–1.0)
Monocytes Relative: 10.1 % (ref 3.0–12.0)
Neutro Abs: 4.4 10*3/uL (ref 1.4–7.7)
Neutrophils Relative %: 62.3 % (ref 43.0–77.0)
Platelets: 174 10*3/uL (ref 150.0–400.0)
RBC: 5.44 Mil/uL (ref 4.22–5.81)
RDW: 13.9 % (ref 11.5–15.5)
WBC: 7.1 10*3/uL (ref 4.0–10.5)

## 2020-02-14 LAB — BASIC METABOLIC PANEL
BUN: 23 mg/dL (ref 6–23)
CO2: 30 mEq/L (ref 19–32)
Calcium: 9.8 mg/dL (ref 8.4–10.5)
Chloride: 101 mEq/L (ref 96–112)
Creatinine, Ser: 1.27 mg/dL (ref 0.40–1.50)
GFR: 57.84 mL/min — ABNORMAL LOW (ref >60.00)
Glucose, Bld: 139 mg/dL — ABNORMAL HIGH (ref 70–99)
Potassium: 5.2 mEq/L — ABNORMAL HIGH (ref 3.5–5.1)
Sodium: 139 mEq/L (ref 135–145)

## 2020-02-14 LAB — HEPATIC FUNCTION PANEL
ALT: 15 U/L (ref 0–53)
AST: 16 U/L (ref 0–37)
Albumin: 4.6 g/dL (ref 3.5–5.2)
Alkaline Phosphatase: 39 U/L (ref 39–117)
Bilirubin, Direct: 0.1 mg/dL (ref 0.0–0.3)
Total Bilirubin: 0.7 mg/dL (ref 0.2–1.2)
Total Protein: 6.9 g/dL (ref 6.0–8.3)

## 2020-02-14 LAB — LIPID PANEL
Cholesterol: 131 mg/dL (ref 0–200)
HDL: 45.4 mg/dL (ref >39.00)
LDL Cholesterol: 68 mg/dL (ref 0–99)
NonHDL: 85.36
Total CHOL/HDL Ratio: 3
Triglycerides: 85 mg/dL (ref 0.0–149.0)
VLDL: 17 mg/dL (ref 0.0–40.0)

## 2020-02-14 LAB — TSH: TSH: 2.08 u[IU]/mL (ref 0.35–4.50)

## 2020-02-14 LAB — PSA: PSA: 1.58 ng/mL (ref 0.10–4.00)

## 2020-02-14 MED ORDER — LISINOPRIL 20 MG PO TABS
ORAL_TABLET | ORAL | 3 refills | Status: DC
Start: 2020-02-14 — End: 2020-09-11

## 2020-02-14 NOTE — Progress Notes (Signed)
   Subjective:    Patient ID: Zachary Clay., male    DOB: 03-Feb-1961, 59 y.o.   MRN: [id number]  HPI CPE- UTD on colonoscopy.  Due for eye exam, foot exam.  UTD on COVID vaccine, Tdap.  Reviewed past medical, surgical, family and social histories.   Health Maintenance  Topic Date Due  . FOOT EXAM  08/07/2019  . OPHTHALMOLOGY EXAM  09/09/2019  . INFLUENZA VACCINE  01/23/2020  . PNEUMOCOCCAL POLYSACCHARIDE VACCINE AGE 74-64 HIGH RISK  10/26/2020 (Originally 01/27/1962)  . Hepatitis C Screening  10/26/2020 (Originally March 21, 1961)  . HIV Screening  10/26/2020 (Originally 01/28/1975)  . HEMOGLOBIN A1C  06/24/2020  . TETANUS/TDAP  12/17/2027  . COLONOSCOPY  04/09/2028  . COVID-19 Vaccine  Completed     Patient Care Team    Relationship Specialty Notifications Start End  Midge Minium, MD PCP - General Family Medicine  05/04/14   Marshell Garfinkel, MD Consulting Physician Pulmonary Disease  08/01/15   Renato Shin, MD Consulting Physician Endocrinology  02/14/20       Review of Systems Patient reports no vision changes, anorexia, fever ,adenopathy, persistant/recurrent hoarseness, swallowing issues, chest pain, palpitations, edema, persistant/recurrent cough, hemoptysis, dyspnea (rest,exertional, paroxysmal nocturnal), gastrointestinal  bleeding (melena, rectal bleeding), abdominal pain, excessive heart burn, GU symptoms (dysuria, hematuria, voiding/incontinence issues) syncope, focal weakness, memory loss, numbness & tingling, skin/hair/nail changes, depression, anxiety, abnormal bruising/bleeding, musculoskeletal symptoms/signs.   Pt has gained 17 lbs since 12/23/2019 + hearing loss  This visit occurred during the SARS-CoV-2 public health emergency.  Safety protocols were in place, including screening questions prior to the visit, additional usage of staff PPE, and extensive cleaning of exam room while observing appropriate contact time as indicated for disinfecting solutions.          Objective:     General Appearance:    Alert, cooperative, no distress, appears stated age, obese  Head:    Normocephalic, without obvious abnormality, atraumatic  Eyes:    PERRL, conjunctiva/corneas clear, EOM's intact, fundi    benign, both eyes       Ears:    Normal TM's and external ear canals, both ears  Nose:   Deferred due to COVID  Throat:   Neck:   Supple, symmetrical, trachea midline, no adenopathy;       thyroid:  No enlargement/tenderness/nodules  Back:     Symmetric, no curvature, ROM normal, no CVA tenderness  Lungs:     Clear to auscultation bilaterally, respirations unlabored  Chest wall:    No tenderness or deformity  Heart:    Regular rate and rhythm, S1 and S2 normal, no murmur, rub   or gallop  Abdomen:     Soft, non-tender, bowel sounds active all four quadrants,    no masses, no organomegaly  Genitalia:    Normal male without lesion, masses,discharge or tenderness  Rectal:    Deferred due to young age  Extremities:   Extremities normal, atraumatic, no cyanosis or edema  Pulses:   2+ and symmetric all extremities  Skin:   Skin color, texture, turgor normal, no rashes, diffuse sun damages  Lymph nodes:   Cervical, supraclavicular, and axillary nodes normal  Neurologic:   CNII-XII intact. Normal strength, sensation and reflexes      throughout          Assessment & Plan:

## 2020-02-14 NOTE — Assessment & Plan Note (Signed)
Chronic problem.  Tolerating statin w/o difficulty.  Stressed need for healthy diet and regular exercise.  Check labs.  Adjust meds prn  

## 2020-02-14 NOTE — Patient Instructions (Addendum)
Follow up in 6 months to recheck BP and cholesterol We'll notify you of your lab results and make any changes if needed Please schedule your eye exam! Focus on healthy diet and regular exercise- you can do it! Call with any questions or concerns Stay Safe!  Stay Healthy!

## 2020-02-14 NOTE — Assessment & Plan Note (Signed)
Pt's PE WNL w/ exception of obesity and known sun damage.  UTD on immunizations, colonoscopy.  Due for eye exam- pt to schedule.  Check labs.  Anticipatory guidance provided.

## 2020-02-15 ENCOUNTER — Encounter: Payer: Self-pay | Admitting: General Practice

## 2020-03-20 ENCOUNTER — Other Ambulatory Visit: Payer: Self-pay | Admitting: Family Medicine

## 2020-04-06 ENCOUNTER — Ambulatory Visit: Payer: BC Managed Care – PPO | Admitting: Endocrinology

## 2020-05-16 ENCOUNTER — Other Ambulatory Visit: Payer: Self-pay | Admitting: Endocrinology

## 2020-05-16 ENCOUNTER — Other Ambulatory Visit: Payer: Self-pay | Admitting: Family Medicine

## 2020-05-16 DIAGNOSIS — E1165 Type 2 diabetes mellitus with hyperglycemia: Secondary | ICD-10-CM

## 2020-05-16 NOTE — Telephone Encounter (Signed)
1.  Please schedule f/u appt 2.  Then please refill x 2 mos, pending that appt.  

## 2020-05-17 ENCOUNTER — Other Ambulatory Visit: Payer: Self-pay

## 2020-05-17 ENCOUNTER — Other Ambulatory Visit: Payer: Self-pay | Admitting: Family Medicine

## 2020-06-06 DIAGNOSIS — M79622 Pain in left upper arm: Secondary | ICD-10-CM | POA: Insufficient documentation

## 2020-07-01 ENCOUNTER — Other Ambulatory Visit: Payer: Self-pay | Admitting: Family Medicine

## 2020-07-07 DIAGNOSIS — J029 Acute pharyngitis, unspecified: Secondary | ICD-10-CM | POA: Diagnosis not present

## 2020-07-07 DIAGNOSIS — J209 Acute bronchitis, unspecified: Secondary | ICD-10-CM | POA: Diagnosis not present

## 2020-07-07 DIAGNOSIS — Z1152 Encounter for screening for COVID-19: Secondary | ICD-10-CM | POA: Diagnosis not present

## 2020-07-20 ENCOUNTER — Other Ambulatory Visit: Payer: Self-pay | Admitting: Family Medicine

## 2020-08-14 ENCOUNTER — Ambulatory Visit: Payer: BC Managed Care – PPO | Admitting: Family Medicine

## 2020-08-31 ENCOUNTER — Ambulatory Visit: Payer: BC Managed Care – PPO | Admitting: Family Medicine

## 2020-08-31 ENCOUNTER — Encounter: Payer: Self-pay | Admitting: Family Medicine

## 2020-08-31 ENCOUNTER — Other Ambulatory Visit: Payer: Self-pay

## 2020-08-31 VITALS — BP 130/80 | HR 61 | Temp 97.6°F | Resp 20 | Ht 67.0 in | Wt 215.8 lb

## 2020-08-31 DIAGNOSIS — E669 Obesity, unspecified: Secondary | ICD-10-CM

## 2020-08-31 DIAGNOSIS — Z23 Encounter for immunization: Secondary | ICD-10-CM | POA: Diagnosis not present

## 2020-08-31 DIAGNOSIS — I10 Essential (primary) hypertension: Secondary | ICD-10-CM

## 2020-08-31 DIAGNOSIS — E1169 Type 2 diabetes mellitus with other specified complication: Secondary | ICD-10-CM

## 2020-08-31 DIAGNOSIS — E785 Hyperlipidemia, unspecified: Secondary | ICD-10-CM

## 2020-08-31 DIAGNOSIS — E1165 Type 2 diabetes mellitus with hyperglycemia: Secondary | ICD-10-CM | POA: Diagnosis not present

## 2020-08-31 LAB — CBC WITH DIFFERENTIAL/PLATELET
Basophils Absolute: 0 10*3/uL (ref 0.0–0.1)
Basophils Relative: 0.7 % (ref 0.0–3.0)
Eosinophils Absolute: 0.3 10*3/uL (ref 0.0–0.7)
Eosinophils Relative: 4.2 % (ref 0.0–5.0)
HCT: 48.6 % (ref 39.0–52.0)
Hemoglobin: 16.6 g/dL (ref 13.0–17.0)
Lymphocytes Relative: 23.5 % (ref 12.0–46.0)
Lymphs Abs: 1.7 10*3/uL (ref 0.7–4.0)
MCHC: 34.1 g/dL (ref 30.0–36.0)
MCV: 87.9 fl (ref 78.0–100.0)
Monocytes Absolute: 0.7 10*3/uL (ref 0.1–1.0)
Monocytes Relative: 10.2 % (ref 3.0–12.0)
Neutro Abs: 4.5 10*3/uL (ref 1.4–7.7)
Neutrophils Relative %: 61.4 % (ref 43.0–77.0)
Platelets: 204 10*3/uL (ref 150.0–400.0)
RBC: 5.53 Mil/uL (ref 4.22–5.81)
RDW: 13.3 % (ref 11.5–15.5)
WBC: 7.3 10*3/uL (ref 4.0–10.5)

## 2020-08-31 LAB — TSH: TSH: 2.22 u[IU]/mL (ref 0.35–4.50)

## 2020-08-31 LAB — LIPID PANEL
Cholesterol: 195 mg/dL (ref 0–200)
HDL: 32.2 mg/dL — ABNORMAL LOW (ref >39.00)
LDL Cholesterol: 132 mg/dL — ABNORMAL HIGH (ref 0–99)
NonHDL: 162.97
Total CHOL/HDL Ratio: 6
Triglycerides: 154 mg/dL — ABNORMAL HIGH (ref 0.0–149.0)
VLDL: 30.8 mg/dL (ref 0.0–40.0)

## 2020-08-31 LAB — HEPATIC FUNCTION PANEL
ALT: 17 U/L (ref 0–53)
AST: 16 U/L (ref 0–37)
Albumin: 4.1 g/dL (ref 3.5–5.2)
Alkaline Phosphatase: 51 U/L (ref 39–117)
Bilirubin, Direct: 0.1 mg/dL (ref 0.0–0.3)
Total Bilirubin: 0.6 mg/dL (ref 0.2–1.2)
Total Protein: 6.9 g/dL (ref 6.0–8.3)

## 2020-08-31 LAB — BASIC METABOLIC PANEL
BUN: 17 mg/dL (ref 6–23)
CO2: 30 mEq/L (ref 19–32)
Calcium: 9.4 mg/dL (ref 8.4–10.5)
Chloride: 100 mEq/L (ref 96–112)
Creatinine, Ser: 1.2 mg/dL (ref 0.40–1.50)
GFR: 65.69 mL/min (ref >60.00)
Glucose, Bld: 197 mg/dL — ABNORMAL HIGH (ref 70–99)
Potassium: 4.7 mEq/L (ref 3.5–5.1)
Sodium: 136 mEq/L (ref 135–145)

## 2020-08-31 LAB — HEMOGLOBIN A1C: Hgb A1c MFr Bld: 9.5 % — ABNORMAL HIGH (ref 4.6–6.5)

## 2020-08-31 NOTE — Patient Instructions (Signed)
Schedule your complete physical in 6 months We'll notify you of your lab results and make any changes if needed Continue to work on healthy diet and regular exercise- you can do it! Try and cut back on your smoking!! SCHEDULE your eye exam at your convenience Call with any questions or concerns Stay Safe!  Stay Healthy! Happy Spring!!!

## 2020-08-31 NOTE — Addendum Note (Signed)
Addended by: Genevie Cheshire L on: 08/31/2020 08:55 AM   Modules accepted: Orders

## 2020-08-31 NOTE — Progress Notes (Signed)
   Subjective:    Patient ID: Zachary Levee., male    DOB: January 17, 1961, 60 y.o.   MRN: [id number]  HPI DM- chronic problem.  Seeing Dr Loanne Drilling but has not had an A1C since July.  On ACE for renal protection.  UTD on foot exam.  Due for eye exam- pt to schedule.  No numbness/tingling of hands/ feet.  HTN- chronic problem, on Lisinopril 20mg  daily w/ good control.  No CP, SOB above baseline, HAs, visual changes, edema.  Hyperlipidemia- chronic problem, on Fenofibrate 160mg  daily, Crestor 20mg  daily.  No abd pain, N/V.  Obesity- pt has gained 6 lbs since August.  No regular exercise.  Pt reports 'trying to' follow diabetes diet.     Review of Systems For ROS see HPI   This visit occurred during the SARS-CoV-2 public health emergency.  Safety protocols were in place, including screening questions prior to the visit, additional usage of staff PPE, and extensive cleaning of exam room while observing appropriate contact time as indicated for disinfecting solutions.       Objective:   Physical Exam Vitals reviewed.  Constitutional:      General: He is not in acute distress.    Appearance: Normal appearance. He is well-developed. He is obese. He is not ill-appearing.  HENT:     Head: Normocephalic and atraumatic.  Eyes:     Conjunctiva/sclera: Conjunctivae normal.     Pupils: Pupils are equal, round, and reactive to light.  Neck:     Thyroid: No thyromegaly.  Cardiovascular:     Rate and Rhythm: Normal rate and regular rhythm.     Pulses: Normal pulses.     Heart sounds: Normal heart sounds. No murmur heard.   Pulmonary:     Effort: Pulmonary effort is normal. No respiratory distress.     Breath sounds: Normal breath sounds.  Abdominal:     General: Bowel sounds are normal. There is no distension.     Palpations: Abdomen is soft.  Musculoskeletal:     Cervical back: Normal range of motion and neck supple.     Right lower leg: No edema.     Left lower leg: No edema.   Lymphadenopathy:     Cervical: No cervical adenopathy.  Skin:    General: Skin is warm and dry.  Neurological:     Mental Status: He is alert and oriented to person, place, and time.     Cranial Nerves: No cranial nerve deficit.  Psychiatric:        Behavior: Behavior normal.           Assessment & Plan:

## 2020-08-31 NOTE — Assessment & Plan Note (Signed)
Chronic problem.  Well controlled w/ Lisinopril 20mg  daily.  Asymptomatic.  Check labs.  No anticipated med changes.  Will follow.

## 2020-08-31 NOTE — Assessment & Plan Note (Signed)
Deteriorated.  Pt has gained 6 lbs since last visit.  Stressed need for low carb diet and regular exercise.  Will continue to follow.

## 2020-08-31 NOTE — Assessment & Plan Note (Signed)
Chronic problem.  On Fenofibrate 160mg  daily and Crestor 20mg  daily w/o difficulty.  Check labs.  Adjust meds prn

## 2020-08-31 NOTE — Assessment & Plan Note (Signed)
Chronic problem.  Following w/ Dr Loanne Drilling but last seen in July.  On ACE for renal protection.  UTD on foot exam.  Due for eye exam- pt to schedule.  Check labs and forward to Endo for review

## 2020-09-01 ENCOUNTER — Other Ambulatory Visit: Payer: Self-pay

## 2020-09-01 DIAGNOSIS — E1165 Type 2 diabetes mellitus with hyperglycemia: Secondary | ICD-10-CM

## 2020-09-01 DIAGNOSIS — E1169 Type 2 diabetes mellitus with other specified complication: Secondary | ICD-10-CM

## 2020-09-01 MED ORDER — DAPAGLIFLOZIN PROPANEDIOL 10 MG PO TABS: 10.0000 mg | ORAL_TABLET | Freq: Every day | ORAL | 0 refills | Status: DC

## 2020-09-01 MED ORDER — METFORMIN HCL ER 500 MG PO TB24: 1000.0000 mg | ORAL_TABLET | Freq: Two times a day (BID) | ORAL | 0 refills | Status: DC

## 2020-09-01 MED ORDER — ROSUVASTATIN CALCIUM 20 MG PO TABS: 20.0000 mg | ORAL_TABLET | Freq: Every day | ORAL | 0 refills | Status: DC

## 2020-09-11 ENCOUNTER — Telehealth: Payer: Self-pay | Admitting: Family Medicine

## 2020-09-11 ENCOUNTER — Other Ambulatory Visit: Payer: Self-pay

## 2020-09-11 DIAGNOSIS — E785 Hyperlipidemia, unspecified: Secondary | ICD-10-CM

## 2020-09-11 DIAGNOSIS — E1165 Type 2 diabetes mellitus with hyperglycemia: Secondary | ICD-10-CM

## 2020-09-11 DIAGNOSIS — E1169 Type 2 diabetes mellitus with other specified complication: Secondary | ICD-10-CM

## 2020-09-11 MED ORDER — ROSUVASTATIN CALCIUM 20 MG PO TABS: 20.0000 mg | ORAL_TABLET | Freq: Every day | ORAL | 1 refills | Status: DC

## 2020-09-11 MED ORDER — METFORMIN HCL ER 500 MG PO TB24: 1000.0000 mg | ORAL_TABLET | Freq: Two times a day (BID) | ORAL | 1 refills | Status: DC

## 2020-09-11 MED ORDER — FENOFIBRATE 160 MG PO TABS: 160.0000 mg | ORAL_TABLET | Freq: Every day | ORAL | 1 refills | Status: DC

## 2020-09-11 MED ORDER — LISINOPRIL 20 MG PO TABS: ORAL_TABLET | ORAL | 3 refills | Status: DC

## 2020-09-11 MED ORDER — DAPAGLIFLOZIN PROPANEDIOL 10 MG PO TABS: 10.0000 mg | ORAL_TABLET | Freq: Every day | ORAL | 1 refills | Status: DC

## 2020-09-11 MED ORDER — ESOMEPRAZOLE MAGNESIUM 40 MG PO CPDR: 40.0000 mg | DELAYED_RELEASE_CAPSULE | Freq: Every day | ORAL | 1 refills | Status: DC

## 2020-09-11 NOTE — Telephone Encounter (Signed)
Insurance no longer covers medication to be filled at [facility]. I have deleted [facility] from patients pharmacy list. I have sent in 6 month scripts of all medications to [facility]

## 2020-09-11 NOTE — Telephone Encounter (Signed)
Patient needs to discuss his medications.  Please advise

## 2020-09-27 ENCOUNTER — Other Ambulatory Visit: Payer: Self-pay

## 2020-09-27 ENCOUNTER — Ambulatory Visit: Payer: BC Managed Care – PPO | Admitting: Endocrinology

## 2020-09-27 VITALS — BP 150/78 | HR 72 | Ht 67.0 in | Wt 211.6 lb

## 2020-09-27 DIAGNOSIS — E1165 Type 2 diabetes mellitus with hyperglycemia: Secondary | ICD-10-CM

## 2020-09-27 LAB — POCT GLYCOSYLATED HEMOGLOBIN (HGB A1C): Hemoglobin A1C: 8.7 % — AB (ref 4.0–5.6)

## 2020-09-27 MED ORDER — GLIMEPIRIDE 4 MG PO TABS: 4.0000 mg | ORAL_TABLET | Freq: Every day | ORAL | 3 refills | Status: DC

## 2020-09-27 NOTE — Progress Notes (Signed)
Subjective:    Patient ID: Zachary Clay., male    DOB: 1960-08-02, 60 y.o.   MRN: [id number]  HPI Pt returns for f/u of diabetes mellitus:  DM type: 2 Dx'ed: 6063 Complications: CRI.  Therapy: 4 oral meds.   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  Other: he had edema with pioglitizone in the past; he is a smoker; abd pain limits metformin dosage; He stopped Ozempic, due to nausea.  Interval history: no cbg record, but states cbg varies from 150-218.  pt states he feels well in general.  He sometimes misses repaglinide.  Past Medical History:  Diagnosis Date  . Diabetes mellitus   . Hyperlipidemia   . Personal history of colonic adenomas 02/16/2013  . Polycythemia 2012   Hgb 17.8    Past Surgical History:  Procedure Laterality Date  . COLONOSCOPY  02/08/2013  . ESOPHAGOGASTRODUODENOSCOPY  2003   [facility]  . WISDOM TOOTH EXTRACTION      Social History   Socioeconomic History  . Marital status: Married    Spouse name: Not on file  . Number of children: Not on file  . Years of education: Not on file  . Highest education level: Not on file  Occupational History    Employer: [employer]  Tobacco Use  . Smoking status: Current Every Day Smoker    Packs/day: 3.00    Types: Cigarettes  . Smokeless tobacco: Never Used  . Tobacco comment: smoked 1977- present, up to 3 ppd  Vaping Use  . Vaping Use: Never used  Substance and Sexual Activity  . Alcohol use: Yes    Alcohol/week: 14.0 standard drinks    Types: 14 Cans of beer per week    Comment: Beer  . Drug use: No  . Sexual activity: Not on file  Other Topics Concern  . Not on file  Social History Narrative  . Not on file   Social Determinants of Health   Financial Resource Strain: Not on file  Food Insecurity: Not on file  Transportation Needs: Not on file  Physical Activity: Not on file  Stress: Not on  file  Social Connections: Not on file  Intimate Partner Violence: Not on file    Current Outpatient Medications on File Prior to Visit  Medication Sig Dispense Refill  . cetirizine (ZYRTEC) 10 MG tablet Take 1 tablet (10 mg total) by mouth daily. 30 tablet 11  . clotrimazole-betamethasone (LOTRISONE) cream APPLY 1 APPLICATION 2 (TWO) TIMES DAILY TOPICALLY. 135 g 1  . dapagliflozin propanediol (FARXIGA) 10 MG TABS tablet Take 1 tablet (10 mg total) by mouth daily. 90 tablet 1  . esomeprazole (NEXIUM) 40 MG capsule Take 1 capsule (40 mg total) by mouth daily. 90 capsule 1  . fenofibrate 160 MG tablet Take 1 tablet (160 mg total) by mouth daily. 90 tablet 1  . glucose blood (ONE TOUCH ULTRA TEST) test strip 1 each by Other route daily. E11.9 100 each 0  . lisinopril (ZESTRIL) 20 MG tablet Take 1/2 tab daily 45 tablet 3  . metFORMIN (GLUCOPHAGE-XR) 500 MG 24 hr tablet Take 2 tablets (1,000 mg total) by mouth 2 (two) times daily. 360 tablet 1  . ONETOUCH DELICA LANCETS MISC by Does not apply route. Check blood sugar once daily    . rosuvastatin (CRESTOR) 20 MG tablet Take 1 tablet (20 mg total) by mouth daily. [place]  tablet 1  . sitaGLIPtin (JANUVIA) 100 MG tablet Take 1 tablet (100 mg total) by mouth daily. 90 tablet 3   No current facility-administered medications on file prior to visit.    Allergies  Allergen Reactions  . Pioglitazone     REACTION: edema    Family History  Problem Relation Age of Onset  . Heart attack Father 59  . Diabetes Mother   . Melanoma Sister   . Diabetes Maternal Grandmother   . Stomach cancer Maternal Grandmother   . Diabetes Maternal Grandfather   . Stroke Neg Hx   . Colon cancer Neg Hx   . Esophageal cancer Neg Hx   . Rectal cancer Neg Hx   . Colon polyps Neg Hx     BP (!) 150/78 (BP Location: Right Arm, Patient Position: Sitting, Cuff Size: Normal)   Pulse 72   Ht 5\' 7"  (1.702 m)   Wt 211 lb 9.6 oz (96 kg)   SpO2 95%   BMI 33.14 kg/m     Review of Systems     Objective:   Physical Exam VITAL SIGNS:  See vs page GENERAL: no distress Pulses: dorsalis pedis intact bilat.   MSK: no deformity of the feet CV: no leg edema Skin:  no ulcer on the feet.  normal color and temp on the feet.  Neuro: sensation is intact to touch on the feet.  Ext: there is bilateral onychomycosis of the toenails.   Lab Results  Component Value Date   HGBA1C 9.5 (H) 08/31/2020   Lab Results  Component Value Date   CREATININE 1.20 08/31/2020   BUN 17 08/31/2020   NA 136 08/31/2020   K 4.7 08/31/2020   CL 100 08/31/2020   CO2 30 08/31/2020       Assessment & Plan:  Type 2 DM: uncontrolled.  therapy limited by noncompliance.  We'll reduce frequency of meds.   Patient Instructions  Your blood pressure is high today.  We'll recheck this next time.   check your blood sugar once a day.  vary the time of day when you check, between before the 3 meals, and at bedtime.  also check if you have symptoms of your blood sugar being too high or too low.  please keep a record of the readings and bring it to your next appointment here (or you can bring the meter itself).  You can write it on any piece of paper.  please call us sooner if your blood sugar goes below 70, or if you have a lot of readings over 200.  I have sent a prescription to your pharmacy, to change repaglinide to glimepiride.   Please continue the same other medications Please come back for a follow-up appointment in 3 months.

## 2020-09-27 NOTE — Patient Instructions (Addendum)
Your blood pressure is high today.  We'll recheck this next time.   check your blood sugar once a day.  vary the time of day when you check, between before the 3 meals, and at bedtime.  also check if you have symptoms of your blood sugar being too high or too low.  please keep a record of the readings and bring it to your next appointment here (or you can bring the meter itself).  You can write it on any piece of paper.  please call us sooner if your blood sugar goes below 70, or if you have a lot of readings over 200.  I have sent a prescription to your pharmacy, to change repaglinide to glimepiride.   Please continue the same other medications Please come back for a follow-up appointment in 3 months.

## 2020-10-01 DIAGNOSIS — J441 Chronic obstructive pulmonary disease with (acute) exacerbation: Secondary | ICD-10-CM | POA: Diagnosis not present

## 2020-10-01 DIAGNOSIS — J111 Influenza due to unidentified influenza virus with other respiratory manifestations: Secondary | ICD-10-CM | POA: Diagnosis not present

## 2020-10-01 DIAGNOSIS — Z20822 Contact with and (suspected) exposure to covid-19: Secondary | ICD-10-CM | POA: Diagnosis not present

## 2020-10-01 DIAGNOSIS — J101 Influenza due to other identified influenza virus with other respiratory manifestations: Secondary | ICD-10-CM | POA: Diagnosis not present

## 2020-10-01 DIAGNOSIS — F17210 Nicotine dependence, cigarettes, uncomplicated: Secondary | ICD-10-CM | POA: Diagnosis not present

## 2020-10-01 DIAGNOSIS — J1189 Influenza due to unidentified influenza virus with other manifestations: Secondary | ICD-10-CM | POA: Diagnosis not present

## 2020-10-01 DIAGNOSIS — J189 Pneumonia, unspecified organism: Secondary | ICD-10-CM | POA: Diagnosis not present

## 2020-10-02 DIAGNOSIS — R0602 Shortness of breath: Secondary | ICD-10-CM | POA: Diagnosis not present

## 2020-11-01 ENCOUNTER — Other Ambulatory Visit: Payer: Self-pay | Admitting: Family Medicine

## 2020-11-01 NOTE — Telephone Encounter (Signed)
LFD 09/21/19 #135g with 1 refill  LOV 08/31/20

## 2020-12-07 ENCOUNTER — Telehealth: Payer: Self-pay | Admitting: Endocrinology

## 2020-12-07 NOTE — Telephone Encounter (Signed)
Pt is needing a refill on   sitaGLIPtin (JANUVIA) 100 MG tablet  glimepiride (AMARYL) 4 MG tablet  [facility] - [place], [place] - [address]

## 2020-12-08 ENCOUNTER — Other Ambulatory Visit: Payer: Self-pay

## 2020-12-08 NOTE — Telephone Encounter (Signed)
Patient called again to request the following RX-Patient states he no longer takes Glimepiride, however requests new RX for the following:  MEDICATION: Repaglinide 2mg -3 tablets per day  PHARMACY:   [facility] [place], [place] Phone:  [phone]  Fax:  [phone]      HAS THE PATIENT CONTACTED [facility]?  Yes-PHARM told Patient they sent request for the above medication with no response  IS THIS A 90 DAY SUPPLY : Yes  IS PATIENT OUT OF MEDICATION: Yes  IF NOT; HOW MUCH IS LEFT: 0  LAST APPOINTMENT DATE: @4 /11/2020  NEXT APPOINTMENT DATE:@7 /14/2022  DO WE HAVE YOUR PERMISSION TO LEAVE A DETAILED MESSAGE?: Yes  OTHER COMMENTS:    **Let patient know to contact pharmacy at the end of the day to make sure medication is ready. **  ** Please notify patient to allow 48-72 hours to process**  **Encourage patient to contact the pharmacy for refills or they can request refills through [facility]**

## 2020-12-12 ENCOUNTER — Other Ambulatory Visit: Payer: Self-pay

## 2020-12-12 DIAGNOSIS — E1165 Type 2 diabetes mellitus with hyperglycemia: Secondary | ICD-10-CM

## 2020-12-12 MED ORDER — SITAGLIPTIN PHOSPHATE 100 MG PO TABS: 100.0000 mg | ORAL_TABLET | Freq: Every day | ORAL | 3 refills | Status: DC

## 2020-12-12 NOTE — Telephone Encounter (Signed)
Message sent thru MyChart 

## 2020-12-20 ENCOUNTER — Encounter: Payer: Self-pay | Admitting: *Deleted

## 2020-12-27 ENCOUNTER — Telehealth: Payer: Self-pay | Admitting: Endocrinology

## 2020-12-27 NOTE — Telephone Encounter (Signed)
Patient requests that Januvia & Glimepiride be sent to [facility].  Patient advises has not had medicine in 5 weeks.

## 2020-12-27 NOTE — Telephone Encounter (Signed)
Pharmacy called to give updated numbers to sent pt prescription:  Ph# [phone] Fax# [phone]

## 2021-01-01 ENCOUNTER — Other Ambulatory Visit: Payer: Self-pay

## 2021-01-01 DIAGNOSIS — E1165 Type 2 diabetes mellitus with hyperglycemia: Secondary | ICD-10-CM

## 2021-01-01 MED ORDER — SITAGLIPTIN PHOSPHATE 100 MG PO TABS: 100.0000 mg | ORAL_TABLET | Freq: Every day | ORAL | 3 refills | Status: DC

## 2021-01-01 MED ORDER — GLIMEPIRIDE 4 MG PO TABS: 4.0000 mg | ORAL_TABLET | Freq: Every day | ORAL | 3 refills | Status: DC

## 2021-01-04 ENCOUNTER — Other Ambulatory Visit: Payer: Self-pay

## 2021-01-04 ENCOUNTER — Ambulatory Visit: Payer: BC Managed Care – PPO | Admitting: Endocrinology

## 2021-01-04 VITALS — BP 120/62 | HR 72 | Ht 67.0 in | Wt 201.0 lb

## 2021-01-04 DIAGNOSIS — E1165 Type 2 diabetes mellitus with hyperglycemia: Secondary | ICD-10-CM | POA: Diagnosis not present

## 2021-01-04 NOTE — Progress Notes (Signed)
Subjective:    Patient ID: Zachary Levee., male    DOB: 05-09-1961, 60 y.o.   MRN: [id number]  HPI Pt returns for f/u of diabetes mellitus:  DM type: 2 Dx'ed: 7026 Complications: CRI.  Therapy: 4 oral meds.   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  Other: he had edema with pioglitizone in the past; he is a smoker; abd pain limits metformin dosage; He stopped Ozempic, due to nausea.  Interval history: He has been out of glimepiride and Januvia.  He says cbg varies from 195-235.   Past Medical History:  Diagnosis Date   Diabetes mellitus    Hyperlipidemia    Personal history of colonic adenomas 02/16/2013   Polycythemia 2012   Hgb 17.8    Past Surgical History:  Procedure Laterality Date   COLONOSCOPY  02/08/2013   ESOPHAGOGASTRODUODENOSCOPY  2003   [facility]   WISDOM TOOTH EXTRACTION      Social History   Socioeconomic History   Marital status: Married    Spouse name: Not on file   Number of children: Not on file   Years of education: Not on file   Highest education level: Not on file  Occupational History    Employer: [employer]  Tobacco Use   Smoking status: Every Day    Packs/day: 3.00    Types: Cigarettes   Smokeless tobacco: Never   Tobacco comments:    smoked 1977- present, up to 3 ppd  Vaping Use   Vaping Use: Never used  Substance and Sexual Activity   Alcohol use: Yes    Alcohol/week: 14.0 standard drinks    Types: 14 Cans of beer per week    Comment: Beer   Drug use: No   Sexual activity: Not on file  Other Topics Concern   Not on file  Social History Narrative   Not on file   Social Determinants of Health   Financial Resource Strain: Not on file  Food Insecurity: Not on file  Transportation Needs: Not on file  Physical Activity: Not on file  Stress: Not on file  Social Connections: Not on file  Intimate Partner Violence: Not on  file    Current Outpatient Medications on File Prior to Visit  Medication Sig Dispense Refill   cetirizine (ZYRTEC) 10 MG tablet Take 1 tablet (10 mg total) by mouth daily. 30 tablet 11   clotrimazole-betamethasone (LOTRISONE) cream APPLY 1 APPLICATION 2 (TWO) TIMES DAILY TOPICALLY. 135 g 1   dapagliflozin propanediol (FARXIGA) 10 MG TABS tablet Take 1 tablet (10 mg total) by mouth daily. 90 tablet 1   esomeprazole (NEXIUM) 40 MG capsule Take 1 capsule (40 mg total) by mouth daily. 90 capsule 1   fenofibrate 160 MG tablet Take 1 tablet (160 mg total) by mouth daily. 90 tablet 1   glimepiride (AMARYL) 4 MG tablet Take 1 tablet (4 mg total) by mouth daily before breakfast. 90 tablet 3   glucose blood (ONE TOUCH ULTRA TEST) test strip 1 each by Other route daily. E11.9 100 each 0   lisinopril (ZESTRIL) 20 MG tablet Take 1/2 tab daily 45 tablet 3   metFORMIN (GLUCOPHAGE-XR) 500 MG 24 hr tablet Take 2 tablets (1,000 mg total) by mouth 2 (two) times daily. 360 tablet 1   ONETOUCH DELICA LANCETS MISC by Does not apply route. Check blood sugar once daily  rosuvastatin (CRESTOR) 20 MG tablet Take 1 tablet (20 mg total) by mouth daily. 90 tablet 1   sitaGLIPtin (JANUVIA) 100 MG tablet Take 1 tablet (100 mg total) by mouth daily. 90 tablet 3   No current facility-administered medications on file prior to visit.    Allergies  Allergen Reactions   Pioglitazone     REACTION: edema    Family History  Problem Relation Age of Onset   Heart attack Father 53   Diabetes Mother    Melanoma Sister    Diabetes Maternal Grandmother    Stomach cancer Maternal Grandmother    Diabetes Maternal Grandfather    Stroke Neg Hx    Colon cancer Neg Hx    Esophageal cancer Neg Hx    Rectal cancer Neg Hx    Colon polyps Neg Hx     BP 120/62 (BP Location: Right Arm, Patient Position: Sitting, Cuff Size: Large)   Pulse 72   Ht 5\' 7"  (1.702 m)   Wt 201 lb (91.2 kg)   SpO2 97%   BMI 31.48 kg/m      Review of Systems     Objective:   Physical Exam Pulses: dorsalis pedis intact bilat.   MSK: no deformity of the feet.  CV: no leg edema.  Skin:  no ulcer on the feet.  normal color and temp on the feet.  Neuro: sensation is intact to touch on the feet.  Ext: there is bilateral onychomycosis of the toenails.    A1c=9.4%     Assessment & Plan:  Type 2 DM: uncontrolled  Patient Instructions  check your blood sugar once a day.  vary the time of day when you check, between before the 3 meals, and at bedtime.  also check if you have symptoms of your blood sugar being too high or too low.  please keep a record of the readings and bring it to your next appointment here (or you can bring the meter itself).  You can write it on any piece of paper.  please call us sooner if your blood sugar goes below 70, or if you have a lot of readings over 200.  The 2 prescriptions you are missing were sent to [facility] 3 days ago.  Please call next week if you do not receive them. Please continue the same 4 diabetes medications.  Please come back for a follow-up appointment in 3 months.

## 2021-01-04 NOTE — Patient Instructions (Addendum)
check your blood sugar once a day.  vary the time of day when you check, between before the 3 meals, and at bedtime.  also check if you have symptoms of your blood sugar being too high or too low.  please keep a record of the readings and bring it to your next appointment here (or you can bring the meter itself).  You can write it on any piece of paper.  please call us sooner if your blood sugar goes below 70, or if you have a lot of readings over 200.  The 2 prescriptions you are missing were sent to [facility] 3 days ago.  Please call next week if you do not receive them. Please continue the same 4 diabetes medications.  Please come back for a follow-up appointment in 3 months.

## 2021-01-10 NOTE — Progress Notes (Signed)
01/11/21- 26 yoM Smoker (up to 3 ppd) with hx OSA, coming to re-establish for OSA Medical problem list includes Tobacco use, HTN, OSA, GERD, DM2, Hyperlipidemia, Obesity,  NPSG 05/10/97 ([facility]) AHI 88.7/ hr, desaturation to 57%,  Had seen Dr Gwenette Greet then Dr Vaughan Browner, last in 2017,  CPAP auto 3-13, [place] replaced Jan, 2017. Epworth score- Body weight today-205 lbs Covid vax- Download chip is blank, but he reports using CPAP every night "wife won't let me sleep without it because of snoring". Feels better with CPAP. Mask headgear worn out. His machine had been recalled, but apparently old enough to replace. It hadn't seemed to be working as well for tiredness in recent months. Discussed his smoking. Had 2 CX[facility] this [facility]- told no pneumonia, no Covid. Agrees to let me give his name to Smoking Cessation program through pharmacy team. He thinks he may have COPD.   Prior to Admission medications   Medication Sig Start Date End Date Taking? Authorizing Provider  cetirizine (ZYRTEC) 10 MG tablet Take 1 tablet (10 mg total) by mouth daily. 02/03/18  Yes Midge Minium, MD  clotrimazole-betamethasone (LOTRISONE) cream APPLY 1 APPLICATION 2 (TWO) TIMES DAILY TOPICALLY. 11/02/20  Yes Midge Minium, MD  dapagliflozin propanediol (FARXIGA) 10 MG TABS tablet Take 1 tablet (10 mg total) by mouth daily. 09/11/20  Yes Midge Minium, MD  esomeprazole (NEXIUM) 40 MG capsule Take 1 capsule (40 mg total) by mouth daily. 09/11/20  Yes Midge Minium, MD  fenofibrate 160 MG tablet Take 1 tablet (160 mg total) by mouth daily. 09/11/20  Yes Midge Minium, MD  glimepiride (AMARYL) 4 MG tablet Take 1 tablet (4 mg total) by mouth daily before breakfast. 01/01/21  Yes Renato Shin, MD  glucose blood (ONE TOUCH ULTRA TEST) test strip 1 each by Other route daily. E11.9 11/08/19  Yes Renato Shin, MD  lisinopril (ZESTRIL) 20 MG tablet  Take 1/2 tab daily 09/11/20  Yes Midge Minium, MD  metFORMIN (GLUCOPHAGE-XR) 500 MG 24 hr tablet Take 2 tablets (1,000 mg total) by mouth 2 (two) times daily. 09/11/20  Yes Midge Minium, MD  [place] LANCETS MISC by Does not apply route. Check blood sugar once daily   Yes [provider]  rosuvastatin (CRESTOR) 20 MG tablet Take 1 tablet (20 mg total) by mouth daily. 09/11/20  Yes Midge Minium, MD  sitaGLIPtin (JANUVIA) 100 MG tablet Take 1 tablet (100 mg total) by mouth daily. 01/01/21  Yes Renato Shin, MD   Past Medical History:  Diagnosis Date   Diabetes mellitus    Hyperlipidemia    Personal history of colonic adenomas 02/16/2013   Polycythemia 2012   Hgb 17.8   Past Surgical History:  Procedure Laterality Date   COLONOSCOPY  02/08/2013   ESOPHAGOGASTRODUODENOSCOPY  2003   [facility]   WISDOM TOOTH EXTRACTION     Family History  Problem Relation Age of Onset   Heart attack Father 65   Diabetes Mother    Melanoma Sister    Diabetes Maternal Grandmother    Stomach cancer Maternal Grandmother    Diabetes Maternal Grandfather    Stroke Neg Hx    Colon cancer Neg Hx    Esophageal cancer Neg Hx    Rectal cancer Neg Hx    Colon polyps Neg Hx    Social History   Socioeconomic History   Marital status: Married    Spouse name:  Not on file   Number of children: Not on file   Years of education: Not on file   Highest education level: Not on file  Occupational History    Employer: [employer]  Tobacco Use   Smoking status: Every Day    Packs/day: 3.00    Types: Cigarettes   Smokeless tobacco: Never   Tobacco comments:    smoked 1977- present, up to 3 ppd  Vaping Use   Vaping Use: Never used  Substance and Sexual Activity   Alcohol use: Yes    Alcohol/week: 14.0 standard drinks    Types: 14 Cans of beer per week    Comment: Beer   Drug use: No   Sexual activity: Not on file  Other Topics Concern   Not on file  Social History  Narrative   Not on file   Social Determinants of Health   Financial Resource Strain: Not on file  Food Insecurity: Not on file  Transportation Needs: Not on file  Physical Activity: Not on file  Stress: Not on file  Social Connections: Not on file  Intimate Partner Violence: Not on file   ROS-see HPI   + = positive Constitutional:    weight loss, night sweats, fevers, chills, +fatigue, lassitude. HEENT:    headaches, difficulty swallowing, tooth/dental problems, sore throat,       sneezing, itching, ear ache, nasal congestion, post nasal drip, snoring CV:    chest pain, orthopnea, PND, swelling in lower extremities, anasarca,                                  dizziness, palpitations Resp:   shortness of breath with exertion or at rest.                productive cough,   +non-productive cough, coughing up of blood.              change in color of mucus.  wheezing.   Skin:    rash or lesions. GI:  No-   heartburn, indigestion, abdominal pain, nausea, vomiting, diarrhea,                 change in bowel habits, loss of appetite GU: dysuria, change in color of urine, no urgency or frequency.   flank pain. MS:   joint pain, stiffness, decreased range of motion, back pain. Neuro-     nothing unusual Psych:  change in mood or affect.  depression or anxiety.   memory loss.  OBJ- Physical Exam General- Alert, Oriented, Affect-appropriate, Distress- none acute, + overweight Skin- rash-none, lesions- none, excoriation- none Lymphadenopathy- none Head- atraumatic            Eyes- Gross vision intact, PERRLA, conjunctivae and secretions clear            Ears- Hearing, canals-normal            Nose- Clear, no-Septal dev, mucus, polyps, erosion, perforation             Throat- Mallampati III , mucosa clear , drainage- none, tonsils- atrophic Neck- flexible , trachea midline, no stridor , thyroid nl, carotid no bruit Chest - symmetrical excursion , unlabored           Heart/CV- RRR ,murmur  +trace S , no gallop  , no rub, nl s1 s2                           -  JVD- none , edema- none, stasis changes- none, varices- none           Lung- clear to P&A, wheeze- none, cough+slight , dullness-none, rub- none           Chest wall-  Abd-  Br/ Gen/ Rectal- Not done, not indicated Extrem- cyanosis- none, clubbing, none, atrophy- none, strength- nl Neuro- grossly intact to observation

## 2021-01-11 ENCOUNTER — Ambulatory Visit: Payer: BC Managed Care – PPO | Admitting: Internal Medicine

## 2021-01-11 ENCOUNTER — Encounter: Payer: Self-pay | Admitting: Internal Medicine

## 2021-01-11 ENCOUNTER — Telehealth: Payer: Self-pay

## 2021-01-11 ENCOUNTER — Other Ambulatory Visit: Payer: Self-pay

## 2021-01-11 VITALS — BP 140/80 | Temp 97.9°F | Ht 67.0 in | Wt 205.0 lb

## 2021-01-11 DIAGNOSIS — G4733 Obstructive sleep apnea (adult) (pediatric): Secondary | ICD-10-CM | POA: Diagnosis not present

## 2021-01-11 DIAGNOSIS — F17200 Nicotine dependence, unspecified, uncomplicated: Secondary | ICD-10-CM

## 2021-01-11 NOTE — Patient Instructions (Addendum)
Order- DME [facility]  please replace old CPAP machine, change to auto 5-20, mask of choice, humidifier, supplies, airView/ card  Order - our pharmacy team to put on list for smoking cessation program  Order- schedule PFT    dx tobacco user  Order- record release so we can request CXR report from this Spring at [facility]  Please call if we can help

## 2021-01-11 NOTE — Telephone Encounter (Signed)
Want patient to be enrolled in the smoking cessation pool.  Seen by Dr. Annamaria Boots on 01/11/21. Referring patient.

## 2021-01-11 NOTE — Assessment & Plan Note (Signed)
Likely has more COPD than recognized. Plan- PFT, get CXR report from [facility]. Refer to smoking cessation program.

## 2021-01-11 NOTE — Assessment & Plan Note (Signed)
Reports good long-term compliance and control. Machine is > 61yo. Noting some residual tiredness. Head gear worn out. Plan- replace old machine, change to auto 5-20

## 2021-02-06 ENCOUNTER — Telehealth: Payer: Self-pay

## 2021-02-06 ENCOUNTER — Other Ambulatory Visit (HOSPITAL_COMMUNITY): Payer: Self-pay

## 2021-02-06 ENCOUNTER — Other Ambulatory Visit: Payer: Self-pay | Admitting: Internal Medicine

## 2021-02-06 DIAGNOSIS — F17200 Nicotine dependence, unspecified, uncomplicated: Secondary | ICD-10-CM

## 2021-02-06 MED ORDER — VARENICLINE TARTRATE 0.5 MG PO TABS
0.5000 mg | ORAL_TABLET | Freq: Two times a day (BID) | ORAL | 2 refills | Status: DC
  Filled 2021-02-06: qty 30, 16d supply, fill #0

## 2021-02-06 NOTE — Telephone Encounter (Signed)
Zachary Clay. is a 60 y.o. male and has been referred to the pharmacist telephone-based smoking cessation service on 01/11/21 by pulmonologist Dr. Annamaria Boots.  -------------------------------------------------------------------------------------------------------------------  Confirmed that patient does not meet any of the following exclusion criteria: Yes  Pregnancy Schizophrenia, bipolar disorder, or major depression Myocardial infarction or coronary artery bypass grafting in the last 2 months Severe or worsening angina  Currently smoking > 10 cigarettes per day? Yes  Willing to quit smoking now or within 30 days? Yes  Interested in using medications to help you quit smoking? Yes   If yes to all above, patient meets criteria for telephone-based service.  -------------------------------------------------------------------------------------------------------------------  Tobacco Use History Current tobacco use: 2.5-3 ppd Smokes [place] light & Vuse e-cig Time to first cigarette: < 30 minutes Started smoking at 60 years old  Quit Attempt History  Have you tried to quit in the past? Yes  Most recent quit attempt: 60 YO Longest time ever been tobacco free: ~2 years. Unsure why he quit or why he started smoking again  Tobacco Use Habits: Triggers include psychological: caffeine, work emotional: boredom physical: if awoken from nocturia, then will smoke once awake. Other smokers in household or daily life: None, smokes alone outside while at home; smokes alone during work Identify social support: spouse, kids, grandkids  On a scale of 1-10, how CONFIDENT are you that you will successfully quit: 5 Barriers/concerns:  No physical barriers, pt expresses concern he is not emotionally strong enough to quit smoking. States he feels like he wants to rip his shirt off when he hasn't had nicotine.   On a scale of 1-10, how IMPORTANT is it to you that you quit: 10 Motivators: health,  cough, money  Past pharmacotherapy trials: (list what agents and why not effective):  '[]'$  Nicotine gum '[]'$  Nicotine lozenge '[x]'$  Nicotine patch, cravings '[]'$  Nicotine inhaler '[]'$  Nicotine nasal spray '[]'$  Bupropion (Zyban) '[x]'$  Varenicline (Chantix), cravings  Current Outpatient Medications  Medication Instructions   cetirizine (ZYRTEC) 10 mg, Oral, Daily   clotrimazole-betamethasone (LOTRISONE) cream APPLY 1 APPLICATION 2 (TWO) TIMES DAILY TOPICALLY.   dapagliflozin propanediol (FARXIGA) 10 mg, Oral, Daily   esomeprazole (NEXIUM) 40 mg, Oral, Daily   fenofibrate 160 mg, Oral, Daily   glimepiride (AMARYL) 4 mg, Oral, Daily before breakfast   glucose blood (ONE TOUCH ULTRA TEST) test strip 1 each, Other, Daily, E11.9   lisinopril (ZESTRIL) 20 MG tablet Take 1/2 tab daily   metFORMIN (GLUCOPHAGE-XR) 1,000 mg, Oral, 2 times daily   ONETOUCH DELICA LANCETS MISC Does not apply, Check blood sugar once daily    rosuvastatin (CRESTOR) 20 mg, Oral, Daily   sitaGLIPtin (JANUVIA) 100 mg, Oral, Daily    Assessment/Plan: Patient states interest in quitting smoking. Has smoked for ~43 years and currently smokes 2.5-3 ppd. He endorses that Chantix has helped him reduce his tobacco intake in the past; however, he assumed the medication wasn't working since he was still smoking while on Chantix.   Smoking cessation goal: reduction intake  Discussed options for smoking cessation agents and patient is agreeable to: '[]'$  Nicotine gum  '[]'$  Nicotine lozenge  '[]'$  Nicotine patch  '[]'$  Bupropion (Zyban) - patient denies history of seizure/epilepsy, bulimia/anorexia, or acute alcohol withdrawal '[x]'$  Varenicline (Chantix)  Treatment was reviewed with the patient including name, instructions, goals of therapy, potential adverse effects including mild itching or redness at the point of application, headache, trouble sleeping, and/or vivid dreams.   Medication counseling:  The following counseling was provided: [  x]  Anticipated nicotine withdrawal symptoms '[x]'$  Coping skills/strategies '[x]'$  Information on 1-800-QUITNOW support program '[x]'$  Tell family and friends about quitting '[x]'$  Stress management '[]'$  Remove tobacco products (cigarettes, lighters, ash trays)   Patient was advised to contact Pulmonary Clinic if questions/concerns arise. Patient verbalized understanding of information.  Follow up in 1-2 week(s)  Time spent: 55 minutes Kayde Art, Pharm.D. PGY-1 Ambulatory Care Resident 02/06/2021 3:05 PM

## 2021-02-06 NOTE — Telephone Encounter (Signed)
Referral intake complete

## 2021-02-07 ENCOUNTER — Other Ambulatory Visit (HOSPITAL_COMMUNITY): Payer: Self-pay

## 2021-02-08 MED ORDER — VARENICLINE TARTRATE 0.5 MG PO TABS
0.5000 mg | ORAL_TABLET | Freq: Two times a day (BID) | ORAL | 2 refills | Status: DC
  Filled 2021-02-08: qty 30, 15d supply, fill #0

## 2021-02-08 NOTE — Telephone Encounter (Signed)
Chantix refilled

## 2021-02-08 NOTE — Telephone Encounter (Signed)
Patient requesting refill for chantix. Is it ok to refill?  Dr. Annamaria Boots please advise   thanks

## 2021-02-09 ENCOUNTER — Other Ambulatory Visit (HOSPITAL_COMMUNITY): Payer: Self-pay

## 2021-02-09 ENCOUNTER — Other Ambulatory Visit: Payer: Self-pay | Admitting: Internal Medicine

## 2021-02-09 DIAGNOSIS — F17200 Nicotine dependence, unspecified, uncomplicated: Secondary | ICD-10-CM

## 2021-02-10 NOTE — Telephone Encounter (Signed)
CY the chantix is not covered by the pts insurance.  Please advise. thanks

## 2021-02-11 NOTE — Telephone Encounter (Signed)
He needs to let his contact at the Tobacco cessation program know that Chantix is not covered by his insurance

## 2021-02-12 ENCOUNTER — Other Ambulatory Visit (HOSPITAL_COMMUNITY): Payer: Self-pay

## 2021-02-13 ENCOUNTER — Other Ambulatory Visit (HOSPITAL_COMMUNITY): Payer: Self-pay

## 2021-02-15 ENCOUNTER — Other Ambulatory Visit: Payer: Self-pay | Admitting: Internal Medicine

## 2021-02-15 ENCOUNTER — Other Ambulatory Visit (HOSPITAL_COMMUNITY): Payer: Self-pay

## 2021-02-15 DIAGNOSIS — F17200 Nicotine dependence, unspecified, uncomplicated: Secondary | ICD-10-CM

## 2021-02-15 MED ORDER — VARENICLINE TARTRATE 0.5 MG PO TABS
0.5000 mg | ORAL_TABLET | Freq: Two times a day (BID) | ORAL | 2 refills | Status: DC
  Filled 2021-02-15: qty 30, 15d supply, fill #0
  Filled 2021-03-09: qty 30, 15d supply, fill #1
  Filled 2021-03-24: qty 30, 15d supply, fill #2

## 2021-02-15 NOTE — Telephone Encounter (Signed)
Chantix script filled

## 2021-02-20 ENCOUNTER — Telehealth: Payer: Self-pay

## 2021-02-20 NOTE — Telephone Encounter (Signed)
   Zachary Clay. is a 60 y.o. male and was referred to the pharmacist telephone-based smoking cessation service on 01/11/21 by pulmonologist Dr. Annamaria Boots.  At last telephone visit on 02/06/21.  Tobacco Use History Current tobacco use: 2.5-3 ppd Smokes [place] light & Vuse e-cig Time to first cigarette: < 30 minutes Started smoking at 60 years old   Quit Attempt History  Have you tried to quit in the past? Yes  Most recent quit attempt: 60 YO Longest time ever been tobacco free: ~2 years. Unsure why he quit or why he started smoking again   Tobacco Use Habits: Triggers include psychological: caffeine, work emotional: boredom physical: if awoken from nocturia, then will smoke once awake. Other smokers in household or daily life: None, smokes alone outside while at home; smokes alone during work Identify social support: spouse, kids, grandkids   On a scale of 1-10, how CONFIDENT are you that you will successfully quit: 5 Barriers/concerns:  No physical barriers, pt expresses concern he is not emotionally strong enough to quit smoking. States he feels like he wants to rip his shirt off when he hasn't had nicotine.    On a scale of 1-10, how IMPORTANT is it to you that you quit: 10 Motivators: health, cough, money   Past pharmacotherapy trials: (list what agents and why not effective):  '[]'$  Nicotine gum '[]'$  Nicotine lozenge '[x]'$  Nicotine patch, cravings '[]'$  Nicotine inhaler '[]'$  Nicotine nasal spray '[]'$  Bupropion (Zyban) '[x]'$  Varenicline (Chantix), cravings  Current tobacco use: 2.5-3 PPD, has not used Vuse in 1-2 weeks  Current Outpatient Medications  Medication Instructions   cetirizine (ZYRTEC) 10 mg, Oral, Daily   clotrimazole-betamethasone (LOTRISONE) cream APPLY 1 APPLICATION 2 (TWO) TIMES DAILY TOPICALLY.   dapagliflozin propanediol (FARXIGA) 10 mg, Oral, Daily   esomeprazole (NEXIUM) 40 mg, Oral, Daily   fenofibrate 160 mg, Oral, Daily   glimepiride (AMARYL) 4 mg, Oral,  Daily before breakfast   glucose blood (ONE TOUCH ULTRA TEST) test strip 1 each, Other, Daily, E11.9   lisinopril (ZESTRIL) 20 MG tablet Take 1/2 tab daily   metFORMIN (GLUCOPHAGE-XR) 1,000 mg, Oral, 2 times daily   ONETOUCH DELICA LANCETS MISC Does not apply, Check blood sugar once daily    rosuvastatin (CRESTOR) 20 mg, Oral, Daily   sitaGLIPtin (JANUVIA) 100 mg, Oral, Daily   varenicline (CHANTIX) 0.5 MG tablet Take 1 tablet by mouth once daily for 3 days then take 1 tablet twice daily    Assessment/Plan: Patient has had no changes in tobacco use from prior as he started varenicline for smoking cessation pharmacotherapy 2 days ago. Denies any adverse effects and endorses he will start taking 1 pill BID starting Thursday. Discussed triggers, barriers, and motivators to quitting.   Discussed plan to call patient next week to further discuss changes in tobacco use once patient has been on therapy a while longer.   Patient prescribed: '[x]'$  Varenicline (Chantix)  Patient was advised to contact Pulmonary Clinic if questions/concerns arise. Patient verbalized understanding of information.  Follow up in 1 week   Time spent: 10 minutes Zachary Clay, Pharm.D. PGY-1 Ambulatory Care Resident 02/20/2021 1:39 PM

## 2021-02-27 ENCOUNTER — Encounter: Payer: BC Managed Care – PPO | Admitting: Family Medicine

## 2021-03-09 ENCOUNTER — Other Ambulatory Visit (HOSPITAL_COMMUNITY): Payer: Self-pay

## 2021-03-16 ENCOUNTER — Telehealth: Payer: Self-pay

## 2021-03-16 NOTE — Telephone Encounter (Signed)
Zachary Clay. is a 60 y.o. male and was referred to the pharmacist telephone-based smoking cessation service on 01/11/21 by pulmonologist Dr. Annamaria Boots. Last telephone smoking cessation phone call done on 02/20/2021.   Today, patient reports he has cut down on his smoking to less than 2 packs/day. He does endorse that he may have started this journey at the wrong time as his dad passed away 03-05-2021. Even with the challenges he's faced around his fathers passing he reports more controlled cravings. He used to immediately smoke after waking up, now he can wait >1h. He also is able to go 3-4 hours without cravings which is an improvement. 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   He would like to set a quit date for November. We will discuss this at his next phone call.   Tobacco Use History Current tobacco use: <2 ppd Smokes [place] light & Vuse e-cig Time to first cigarette: >1h Started smoking at 60 years old   Quit Attempt History  Have you tried to quit in the past? Yes  Most recent quit attempt: 60 YO Longest time ever been tobacco free: ~2 years. Unsure why he quit or why he started smoking again  Tobacco Use Habits: Triggers include psychological: caffeine, work emotional: boredom physical: if awoken from nocturia, then will smoke once awake. Other smokers in household or daily life: None, smokes alone outside while at home; smokes alone during work Identify social support: spouse, kids, grandkids  Current Outpatient Medications  Medication Instructions   cetirizine (ZYRTEC) 10 mg, Oral, Daily   clotrimazole-betamethasone (LOTRISONE) cream APPLY 1 APPLICATION 2 (TWO) TIMES DAILY TOPICALLY.   dapagliflozin propanediol (FARXIGA) 10 mg, Oral, Daily   esomeprazole (NEXIUM) 40 mg, Oral, Daily   fenofibrate 160 mg, Oral, Daily   glimepiride (AMARYL) 4 mg, Oral, Daily before breakfast   glucose blood (ONE TOUCH ULTRA TEST) test  strip 1 each, Other, Daily, E11.9   lisinopril (ZESTRIL) 20 MG tablet Take 1/2 tab daily   metFORMIN (GLUCOPHAGE-XR) 1,000 mg, Oral, 2 times daily   ONETOUCH DELICA LANCETS MISC Does not apply, Check blood sugar once daily    rosuvastatin (CRESTOR) 20 mg, Oral, Daily   sitaGLIPtin (JANUVIA) 100 mg, Oral, Daily   varenicline (CHANTIX) 0.5 MG tablet Take 1 tablet by mouth once daily for 3 days then take 1 tablet twice daily   Assessment/Plan: Patient has decreased tobacco use from prior, congratulated on progress. Currently using varenicline for smoking cessation pharmacotherapy. Denies vomiting, abdominal pain, and nightmares. He did report having nightmares and vivid dreams for the first 2 weeks. Unsure if it was due to Chantix or the declining health of his father. Discussed triggers, barriers, and motivators to quitting.  -Continued Chantix -Set quit date for November   Patient was advised to contact Pulmonary Clinic if questions/concerns arise. Patient verbalized understanding of information.  Follow up in 4 week(s) Time spent: 10 minutes  Garmon Art, Pharm.D. PGY-1 Pharmacy Resident 03/16/2021 1:32 PM

## 2021-03-24 ENCOUNTER — Other Ambulatory Visit (HOSPITAL_COMMUNITY): Payer: Self-pay

## 2021-04-10 ENCOUNTER — Ambulatory Visit: Payer: BC Managed Care – PPO | Admitting: Endocrinology

## 2021-04-13 ENCOUNTER — Other Ambulatory Visit: Payer: Self-pay | Admitting: Family Medicine

## 2021-04-13 DIAGNOSIS — E1165 Type 2 diabetes mellitus with hyperglycemia: Secondary | ICD-10-CM

## 2021-04-13 DIAGNOSIS — E1169 Type 2 diabetes mellitus with other specified complication: Secondary | ICD-10-CM

## 2021-04-23 DIAGNOSIS — G4733 Obstructive sleep apnea (adult) (pediatric): Secondary | ICD-10-CM | POA: Diagnosis not present

## 2021-04-25 ENCOUNTER — Telehealth: Payer: Self-pay

## 2021-04-25 ENCOUNTER — Other Ambulatory Visit (HOSPITAL_COMMUNITY): Payer: Self-pay

## 2021-04-25 ENCOUNTER — Other Ambulatory Visit: Payer: Self-pay

## 2021-04-25 ENCOUNTER — Ambulatory Visit (INDEPENDENT_AMBULATORY_CARE_PROVIDER_SITE_OTHER): Payer: BC Managed Care – PPO | Admitting: Family Medicine

## 2021-04-25 ENCOUNTER — Encounter: Payer: Self-pay | Admitting: Family Medicine

## 2021-04-25 VITALS — BP 124/70 | HR 70 | Temp 97.2°F | Resp 16 | Ht 66.0 in | Wt 210.0 lb

## 2021-04-25 DIAGNOSIS — F17200 Nicotine dependence, unspecified, uncomplicated: Secondary | ICD-10-CM

## 2021-04-25 DIAGNOSIS — Z23 Encounter for immunization: Secondary | ICD-10-CM | POA: Diagnosis not present

## 2021-04-25 DIAGNOSIS — Z125 Encounter for screening for malignant neoplasm of prostate: Secondary | ICD-10-CM

## 2021-04-25 DIAGNOSIS — E785 Hyperlipidemia, unspecified: Secondary | ICD-10-CM | POA: Diagnosis not present

## 2021-04-25 DIAGNOSIS — E1169 Type 2 diabetes mellitus with other specified complication: Secondary | ICD-10-CM

## 2021-04-25 DIAGNOSIS — L578 Other skin changes due to chronic exposure to nonionizing radiation: Secondary | ICD-10-CM

## 2021-04-25 DIAGNOSIS — Z0000 Encounter for general adult medical examination without abnormal findings: Secondary | ICD-10-CM | POA: Diagnosis not present

## 2021-04-25 LAB — CBC WITH DIFFERENTIAL/PLATELET
Basophils Absolute: 0.1 10*3/uL (ref 0.0–0.1)
Basophils Relative: 0.8 % (ref 0.0–3.0)
Eosinophils Absolute: 0.2 10*3/uL (ref 0.0–0.7)
Eosinophils Relative: 1.9 % (ref 0.0–5.0)
HCT: 51.5 % (ref 39.0–52.0)
Hemoglobin: 17.1 g/dL — ABNORMAL HIGH (ref 13.0–17.0)
Lymphocytes Relative: 19.5 % (ref 12.0–46.0)
Lymphs Abs: 1.7 10*3/uL (ref 0.7–4.0)
MCHC: 33.2 g/dL (ref 30.0–36.0)
MCV: 88.8 fl (ref 78.0–100.0)
Monocytes Absolute: 0.8 10*3/uL (ref 0.1–1.0)
Monocytes Relative: 9.4 % (ref 3.0–12.0)
Neutro Abs: 5.8 10*3/uL (ref 1.4–7.7)
Neutrophils Relative %: 68.4 % (ref 43.0–77.0)
Platelets: 209 10*3/uL (ref 150.0–400.0)
RBC: 5.8 Mil/uL (ref 4.22–5.81)
RDW: 13.2 % (ref 11.5–15.5)
WBC: 8.5 10*3/uL (ref 4.0–10.5)

## 2021-04-25 LAB — BASIC METABOLIC PANEL
BUN: 19 mg/dL (ref 6–23)
CO2: 29 mEq/L (ref 19–32)
Calcium: 9.7 mg/dL (ref 8.4–10.5)
Chloride: 99 mEq/L (ref 96–112)
Creatinine, Ser: 1.35 mg/dL (ref 0.40–1.50)
GFR: 56.77 mL/min — ABNORMAL LOW (ref >60.00)
Glucose, Bld: 125 mg/dL — ABNORMAL HIGH (ref 70–99)
Potassium: 4.7 mEq/L (ref 3.5–5.1)
Sodium: 137 mEq/L (ref 135–145)

## 2021-04-25 LAB — HEPATIC FUNCTION PANEL
ALT: 19 U/L (ref 0–53)
AST: 16 U/L (ref 0–37)
Albumin: 4.7 g/dL (ref 3.5–5.2)
Alkaline Phosphatase: 45 U/L (ref 39–117)
Bilirubin, Direct: 0.1 mg/dL (ref 0.0–0.3)
Total Bilirubin: 0.8 mg/dL (ref 0.2–1.2)
Total Protein: 7 g/dL (ref 6.0–8.3)

## 2021-04-25 LAB — TSH: TSH: 2.24 u[IU]/mL (ref 0.35–5.50)

## 2021-04-25 LAB — LIPID PANEL
Cholesterol: 211 mg/dL — ABNORMAL HIGH (ref 0–200)
HDL: 36.7 mg/dL — ABNORMAL LOW (ref >39.00)
LDL Cholesterol: 143 mg/dL — ABNORMAL HIGH (ref 0–99)
NonHDL: 174.26
Total CHOL/HDL Ratio: 6
Triglycerides: 158 mg/dL — ABNORMAL HIGH (ref 0.0–149.0)
VLDL: 31.6 mg/dL (ref 0.0–40.0)

## 2021-04-25 LAB — HEMOGLOBIN A1C: Hgb A1c MFr Bld: 7.9 % — ABNORMAL HIGH (ref 4.6–6.5)

## 2021-04-25 LAB — PSA: PSA: 1.7 ng/mL (ref 0.10–4.00)

## 2021-04-25 MED ORDER — VARENICLINE TARTRATE 0.5 MG PO TABS
0.5000 mg | ORAL_TABLET | Freq: Two times a day (BID) | ORAL | 2 refills | Status: DC
  Filled 2021-04-25: qty 30, 15d supply, fill #0

## 2021-04-25 NOTE — Assessment & Plan Note (Signed)
Chronic problem.  Following w/ Dr Loanne Drilling for DM.  Will check labs for cholesterol and adjust tx prn.  Pt expressed understanding and is in agreement w/ plan.

## 2021-04-25 NOTE — Progress Notes (Signed)
   Subjective:    Patient ID: Zachary Clay., male    DOB: 11-Aug-1960, 60 y.o.   MRN: [id number]  HPI CPE- UTD on colonoscopy.  UTD on foot exam.  Due for eye exam.  On ACE for renal protection.  Declines flu shot but will do shingles shot today.  Health Maintenance  Topic Date Due   Pneumococcal Vaccine 37-70 Years old (1 - PCV) Never done   HIV Screening  Never done   Hepatitis C Screening  Never done   OPHTHALMOLOGY EXAM  09/09/2019   COVID-19 Vaccine (3 - Booster for Moderna series) 11/23/2019   Zoster Vaccines- Shingrix (2 of 2) 10/26/2020   HEMOGLOBIN A1C  03/29/2021   INFLUENZA VACCINE  09/21/2021 (Originally 01/22/2021)   FOOT EXAM  01/04/2022   TETANUS/TDAP  12/17/2027   COLONOSCOPY (Pts 45-2yrs Insurance coverage will need to be confirmed)  04/09/2028   HPV VACCINES  Aged Out      Review of Systems Patient reports no vision/hearing changes, anorexia, fever ,adenopathy, persistant/recurrent hoarseness, swallowing issues, chest pain, palpitations, edema, persistant/recurrent cough, hemoptysis, dyspnea (rest,exertional, paroxysmal nocturnal), gastrointestinal  bleeding (melena, rectal bleeding), abdominal pain, excessive heart burn, GU symptoms (dysuria, hematuria, voiding/incontinence issues) syncope, focal weakness, memory loss, numbness & tingling, skin/hair/nail changes, depression, anxiety, abnormal bruising/bleeding, musculoskeletal symptoms/signs.   This visit occurred during the SARS-CoV-2 public health emergency.  Safety protocols were in place, including screening questions prior to the visit, additional usage of staff PPE, and extensive cleaning of exam room while observing appropriate contact time as indicated for disinfecting solutions.      Objective:   Physical Exam General Appearance:    Alert, cooperative, no distress, appears stated age  Head:    Normocephalic, without obvious abnormality, atraumatic  Eyes:    PERRL, conjunctiva/corneas clear, EOM's intact,  fundi    benign, both eyes       Ears:    Normal TM's and external ear canals, both ears  Nose:   Deferred due to COVID  Throat:   Neck:   Supple, symmetrical, trachea midline, no adenopathy;       thyroid:  No enlargement/tenderness/nodules  Back:     Symmetric, no curvature, ROM normal, no CVA tenderness  Lungs:     Clear to auscultation bilaterally, respirations unlabored  Chest wall:    No tenderness or deformity  Heart:    Regular rate and rhythm, S1 and S2 normal, no murmur, rub   or gallop  Abdomen:     Soft, non-tender, bowel sounds active all four quadrants,    no masses, no organomegaly  Genitalia:    deferred  Rectal:    Extremities:   Extremities normal, atraumatic, no cyanosis or edema  Pulses:   2+ and symmetric all extremities  Skin:   Skin color, texture, turgor normal, no rashes.  Extensive sun damage present  Lymph nodes:   Cervical, supraclavicular, and axillary nodes normal  Neurologic:   CNII-XII intact. Normal strength, sensation and reflexes      throughout          Assessment & Plan:

## 2021-04-25 NOTE — Assessment & Plan Note (Signed)
Pt's PE unchanged from previous.  BMI is now 33.89.  Encouraged healthy diet and regular exercise.  UTD on colonoscopy, foot exam.  Due for eye exam- pt to schedule.  Due to extensive sun damage will refer to derm for complete evaluation.  Check labs.  Anticipatory guidance provided.

## 2021-04-25 NOTE — Patient Instructions (Addendum)
Follow up in 6 months to recheck BP and cholesterol We'll notify you of your lab results and make any changes if needed SCHEDULE your eye exam and have them send me a copy of their note We'll call you with your Dermatology appt to check your skin Call with any questions or concerns Stay Safe!  Stay Healthy! Happy Holidays!!

## 2021-04-25 NOTE — Telephone Encounter (Signed)
   Zachary Clay. is a 60 y.o. male and was referred to the pharmacist telephone-based smoking cessation service on 01/11/21 by pulmonologist Dr. Annamaria Boots.  At last telephone visit on 03/16/21  Tobacco Use History Current tobacco use: <1.5 ppd Smokes [place] light & Vuse e-cig Time to first cigarette: >1h Started smoking at 60 years old   Quit Attempt History  Have you tried to quit in the past? Yes  Most recent quit attempt: 60 YO Longest time ever been tobacco free: ~2 years. Unsure why he quit or why he started smoking again   Tobacco Use Habits: Triggers include psychological: caffeine, work emotional: boredom physical: if awoken from nocturia, then will smoke once awake. Other smokers in household or daily life: None, smokes alone outside while at home; smokes alone during work Identify social support: spouse, kids, grandkids  Current tobacco use: <2 picks cigarettes/day  Current Outpatient Medications  Medication Instructions   cetirizine (ZYRTEC) 10 mg, Oral, Daily   clotrimazole-betamethasone (LOTRISONE) cream APPLY 1 APPLICATION 2 (TWO) TIMES DAILY TOPICALLY.   esomeprazole (NEXIUM) 40 MG capsule TAKE 1 CAPSULE DAILY   FARXIGA 10 MG TABS tablet TAKE 1 TABLET DAILY   fenofibrate 160 MG tablet TAKE 1 TABLET DAILY   glimepiride (AMARYL) 4 mg, Oral, Daily before breakfast   glucose blood (ONE TOUCH ULTRA TEST) test strip 1 each, Other, Daily, E11.9   lisinopril (ZESTRIL) 20 MG tablet Take 1/2 tab daily   metFORMIN (GLUCOPHAGE-XR) 1,000 mg, Oral, 2 times daily   ONETOUCH DELICA LANCETS MISC Does not apply, Check blood sugar once daily    rosuvastatin (CRESTOR) 20 mg, Oral, Daily   sitaGLIPtin (JANUVIA) 100 mg, Oral, Daily   varenicline (CHANTIX) 0.5 MG tablet Take 1 tablet by mouth once daily for 3 days then take 1 tablet twice daily    Assessment/Plan: Patient has decreased tobacco use from prior, congratulated on progress. Currently using varenicline for smoking  cessation pharmacotherapy. However, he does report that he has been out of Chantix for a few weeks. -Will send refill for chantix -set new goal of smoking <1 PPD in 1 week   Treatment was reviewed with the patient including name, instructions, goals of therapy, potential adverse effects including mild itching or redness at the point of application, headache, trouble sleeping, and/or vivid dreams.   Patient was advised to contact Pulmonary Clinic if questions/concerns arise. Patient verbalized understanding of information.  Follow up prn  Time spent: 15 minutes Pauletta Browns, Pharm.D. PGY-1 Pharmacy Resident OACZY:[id number] 04/25/2021 2:02 PM

## 2021-04-27 ENCOUNTER — Other Ambulatory Visit: Payer: Self-pay

## 2021-04-27 DIAGNOSIS — E1169 Type 2 diabetes mellitus with other specified complication: Secondary | ICD-10-CM

## 2021-04-27 DIAGNOSIS — E785 Hyperlipidemia, unspecified: Secondary | ICD-10-CM

## 2021-04-27 MED ORDER — ROSUVASTATIN CALCIUM 20 MG PO TABS: 20.0000 mg | ORAL_TABLET | Freq: Every day | ORAL | 1 refills | Status: DC

## 2021-05-01 ENCOUNTER — Telehealth: Payer: Self-pay

## 2021-05-01 NOTE — Telephone Encounter (Signed)
Patient was contacted for smoking cessation follow up to ensure that Chantix arrived at his house. He reports that he is currently in [place] and is unable to check his mail. He will return this weekend and let me know if he did not receive Chantix.   Atiba Art, Pharm.D. PGY-1 Pharmacy Resident 05/01/2021 2:59 PM

## 2021-05-03 ENCOUNTER — Other Ambulatory Visit (HOSPITAL_COMMUNITY): Payer: Self-pay

## 2021-05-09 ENCOUNTER — Telehealth: Payer: Self-pay | Admitting: Family Medicine

## 2021-05-09 NOTE — Telephone Encounter (Signed)
Pt would like a copy of his resent lab work mailed to his home address.

## 2021-05-09 NOTE — Telephone Encounter (Signed)
Printed and mailed

## 2021-05-14 ENCOUNTER — Ambulatory Visit (INDEPENDENT_AMBULATORY_CARE_PROVIDER_SITE_OTHER): Payer: BC Managed Care – PPO | Admitting: Endocrinology

## 2021-05-14 ENCOUNTER — Other Ambulatory Visit: Payer: Self-pay

## 2021-05-14 VITALS — BP 120/64 | HR 71 | Ht 66.0 in | Wt 217.8 lb

## 2021-05-14 DIAGNOSIS — E1165 Type 2 diabetes mellitus with hyperglycemia: Secondary | ICD-10-CM | POA: Diagnosis not present

## 2021-05-14 LAB — POCT GLYCOSYLATED HEMOGLOBIN (HGB A1C): Hemoglobin A1C: 8 % — AB (ref 4.0–5.6)

## 2021-05-14 MED ORDER — RYBELSUS 7 MG PO TABS: 7.0000 mg | ORAL_TABLET | Freq: Every day | ORAL | 3 refills | Status: DC

## 2021-05-14 MED ORDER — GLIMEPIRIDE 2 MG PO TABS: 2.0000 mg | ORAL_TABLET | Freq: Every day | ORAL | 3 refills | Status: DC

## 2021-05-14 NOTE — Patient Instructions (Addendum)
check your blood sugar once a day.  vary the time of day when you check, between before the 3 meals, and at bedtime.  also check if you have symptoms of your blood sugar being too high or too low.  please keep a record of the readings and bring it to your next appointment here (or you can bring the meter itself).  You can write it on any piece of paper.  please call us sooner if your blood sugar goes below 70, or if you have a lot of readings over 200.   I have sent 2 prescriptions to your pharmacy: to reduce the glimepiride, and to change Januvia to Rybelsus.   Please continue the same other 2 diabetes medications.  Please come back for a follow-up appointment in 3 months.

## 2021-05-14 NOTE — Progress Notes (Signed)
Subjective:    Patient ID: Zachary Clay., male    DOB: March 27, 1961, 60 y.o.   MRN: [id number]  HPI Pt returns for f/u of diabetes mellitus:  DM type: 2 Dx'ed: 6294 Complications: CRI.  Therapy: 4 oral meds.   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  Other: he had edema with pioglitizone in the past; he is a smoker; abd pain limits metformin dosage; He stopped Ozempic, due to nausea.  Interval history: He takes meds as rx'ed.  He says cbg varies from [place].  He seldom has hypoglycemia, and these episodes are mild.  This happens when a meal is missed or delayed.   Past Medical History:  Diagnosis Date   Diabetes mellitus    Hyperlipidemia    Personal history of colonic adenomas 02/16/2013   Polycythemia 2012   Hgb 17.8    Past Surgical History:  Procedure Laterality Date   COLONOSCOPY  02/08/2013   ESOPHAGOGASTRODUODENOSCOPY  2003   [facility]   WISDOM TOOTH EXTRACTION      Social History   Socioeconomic History   Marital status: Married    Spouse name: Not on file   Number of children: Not on file   Years of education: Not on file   Highest education level: Not on file  Occupational History    Employer: [employer]  Tobacco Use   Smoking status: Every Day    Packs/day: 3.00    Types: Cigarettes   Smokeless tobacco: Never   Tobacco comments:    smoked 1977- present, up to 3 ppd  Vaping Use   Vaping Use: Never used  Substance and Sexual Activity   Alcohol use: Yes    Alcohol/week: 14.0 standard drinks    Types: 14 Cans of beer per week    Comment: Beer   Drug use: No   Sexual activity: Not on file  Other Topics Concern   Not on file  Social History Narrative   Not on file   Social Determinants of Health   Financial Resource Strain: Not on file  Food Insecurity: Not on file  Transportation Needs: Not on file  Physical Activity: Not on file   Stress: Not on file  Social Connections: Not on file  Intimate Partner Violence: Not on file    Current Outpatient Medications on File Prior to Visit  Medication Sig Dispense Refill   cetirizine (ZYRTEC) 10 MG tablet Take 1 tablet (10 mg total) by mouth daily. (Patient not taking: Reported on 04/25/2021) 30 tablet 11   clotrimazole-betamethasone (LOTRISONE) cream APPLY 1 APPLICATION 2 (TWO) TIMES DAILY TOPICALLY. 135 g 1   esomeprazole (NEXIUM) 40 MG capsule TAKE 1 CAPSULE DAILY 90 capsule 1   FARXIGA 10 MG TABS tablet TAKE 1 TABLET DAILY 90 tablet 1   fenofibrate 160 MG tablet TAKE 1 TABLET DAILY 90 tablet 1   glucose blood (ONE TOUCH ULTRA TEST) test strip 1 each by Other route daily. E11.9 100 each 0   lisinopril (ZESTRIL) 20 MG tablet Take 1/2 tab daily 45 tablet 3   metFORMIN (GLUCOPHAGE-XR) 500 MG 24 hr tablet Take 2 tablets (1,000 mg total) by mouth 2 (two) times daily. 360 tablet 1   ONETOUCH DELICA LANCETS MISC by Does not apply route. Check blood sugar once daily     rosuvastatin (CRESTOR) 20 MG tablet Take 1 tablet (20 mg total) by mouth daily. [place]  tablet 1   varenicline (CHANTIX) 0.5 MG tablet Take 1 tablet by mouth 2 times daily. 30 tablet 2   No current facility-administered medications on file prior to visit.    Allergies  Allergen Reactions   Pioglitazone     REACTION: edema    Family History  Problem Relation Age of Onset   Heart attack Father 28   Diabetes Mother    Melanoma Sister    Diabetes Maternal Grandmother    Stomach cancer Maternal Grandmother    Diabetes Maternal Grandfather    Stroke Neg Hx    Colon cancer Neg Hx    Esophageal cancer Neg Hx    Rectal cancer Neg Hx    Colon polyps Neg Hx     BP 120/64 (BP Location: Right Arm, Patient Position: Sitting, Cuff Size: Large)   Pulse 71   Ht 5\' 6"  (1.676 m)   Wt 217 lb 12.8 oz (98.8 kg)   SpO2 95%   BMI 35.15 kg/m    Review of Systems He has gained a few lbs.  Denies n/v.  Nexium controls HB.       Objective:   Physical Exam    Lab Results  Component Value Date   HGBA1C 8.0 (A) 05/14/2021      Assessment & Plan:  Type 2 DM: uncontrolled Hypoglycemia, due to glimepiride. HB, well-controlled.  We discussed.  He chooses to change to Rybelsus anyway.  Patient Instructions  check your blood sugar once a day.  vary the time of day when you check, between before the 3 meals, and at bedtime.  also check if you have symptoms of your blood sugar being too high or too low.  please keep a record of the readings and bring it to your next appointment here (or you can bring the meter itself).  You can write it on any piece of paper.  please call us sooner if your blood sugar goes below 70, or if you have a lot of readings over 200.   I have sent 2 prescriptions to your pharmacy: to reduce the glimepiride, and to change Januvia to Rybelsus.   Please continue the same other 2 diabetes medications.  Please come back for a follow-up appointment in 3 months.

## 2021-05-23 DIAGNOSIS — G4733 Obstructive sleep apnea (adult) (pediatric): Secondary | ICD-10-CM | POA: Diagnosis not present

## 2021-06-01 DIAGNOSIS — G4733 Obstructive sleep apnea (adult) (pediatric): Secondary | ICD-10-CM | POA: Diagnosis not present

## 2021-06-23 DIAGNOSIS — G4733 Obstructive sleep apnea (adult) (pediatric): Secondary | ICD-10-CM | POA: Diagnosis not present

## 2021-07-24 DIAGNOSIS — G4733 Obstructive sleep apnea (adult) (pediatric): Secondary | ICD-10-CM | POA: Diagnosis not present

## 2021-08-07 ENCOUNTER — Other Ambulatory Visit: Payer: Self-pay | Admitting: Family Medicine

## 2021-08-15 ENCOUNTER — Ambulatory Visit: Payer: BC Managed Care – PPO | Admitting: Endocrinology

## 2021-08-15 ENCOUNTER — Other Ambulatory Visit: Payer: Self-pay

## 2021-08-15 VITALS — BP 140/64 | HR 71 | Ht 66.0 in | Wt 211.4 lb

## 2021-08-15 DIAGNOSIS — E1165 Type 2 diabetes mellitus with hyperglycemia: Secondary | ICD-10-CM | POA: Diagnosis not present

## 2021-08-15 LAB — POCT GLYCOSYLATED HEMOGLOBIN (HGB A1C): Hemoglobin A1C: 8 % — AB (ref 4.0–5.6)

## 2021-08-15 MED ORDER — RYBELSUS 14 MG PO TABS: 14.0000 mg | ORAL_TABLET | Freq: Every day | ORAL | 3 refills | Status: DC

## 2021-08-15 MED ORDER — GLIMEPIRIDE 1 MG PO TABS: 0.5000 mg | ORAL_TABLET | Freq: Every day | ORAL | 5 refills | Status: DC

## 2021-08-15 NOTE — Patient Instructions (Addendum)
check your blood sugar once a day.  vary the time of day when you check, between before the 3 meals, and at bedtime.  also check if you have symptoms of your blood sugar being too high or too low.  please keep a record of the readings and bring it to your next appointment here (or you can bring the meter itself).  You can write it on any piece of paper.  please call us sooner if your blood sugar goes below 70, or if you have a lot of readings over 200.   I have sent 2 prescriptions to your pharmacy: to reduce the glimepiride to 1/2 of a 1 mg pill, and to increase the Rybelsus.   Please continue the same other 2 diabetes medications.   Please come back for a follow-up appointment in 2 months.

## 2021-08-15 NOTE — Progress Notes (Signed)
Subjective:    Patient ID: Zachary Clay., male    DOB: 08-24-1960, 61 y.o.   MRN: [id number]  HPI Pt returns for f/u of diabetes mellitus:  DM type: 2 Dx'ed: 0347 Complications: CRI.  Therapy: 4 oral meds.   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   Other: he had edema with pioglitizone in the past; he is a smoker; abd pain limits metformin dosage; He stopped Ozempic, due to nausea.  Interval history: He takes meds as rx'ed.  He says cbg varies from 150-200.  He still seldom has hypoglycemia, and these episodes are mild.  This happens when a meal is missed or delayed.  He says amaryl is 1/2 of 2 mg, QD.   Past Medical History:  Diagnosis Date   Diabetes mellitus    Hyperlipidemia    Personal history of colonic adenomas 02/16/2013   Polycythemia 2012   Hgb 17.8    Past Surgical History:  Procedure Laterality Date   COLONOSCOPY  02/08/2013   ESOPHAGOGASTRODUODENOSCOPY  2003   [facility]   WISDOM TOOTH EXTRACTION      Social History   Socioeconomic History   Marital status: Married    Spouse name: Not on file   Number of children: Not on file   Years of education: Not on file   Highest education level: Not on file  Occupational History    Employer: [employer]  Tobacco Use   Smoking status: Every Day    Packs/day: 3.00    Types: Cigarettes   Smokeless tobacco: Never   Tobacco comments:    smoked 1977- present, up to 3 ppd  Vaping Use   Vaping Use: Never used  Substance and Sexual Activity   Alcohol use: Yes    Alcohol/week: 14.0 standard drinks    Types: 14 Cans of beer per week    Comment: Beer   Drug use: No   Sexual activity: Not on file  Other Topics Concern   Not on file  Social History Narrative   Not on file   Social Determinants of Health   Financial Resource Strain: Not on file  Food Insecurity: Not on file  Transportation Needs: Not on  file  Physical Activity: Not on file  Stress: Not on file  Social Connections: Not on file  Intimate Partner Violence: Not on file    Current Outpatient Medications on File Prior to Visit  Medication Sig Dispense Refill   cetirizine (ZYRTEC) 10 MG tablet Take 1 tablet (10 mg total) by mouth daily. 30 tablet 11   clotrimazole-betamethasone (LOTRISONE) cream APPLY 1 APPLICATION 2 (TWO) TIMES DAILY TOPICALLY. 135 g 1   esomeprazole (NEXIUM) 40 MG capsule TAKE 1 CAPSULE DAILY 90 capsule 1   FARXIGA 10 MG TABS tablet TAKE 1 TABLET DAILY 90 tablet 1   fenofibrate 160 MG tablet TAKE 1 TABLET DAILY 90 tablet 1   glucose blood (ONE TOUCH ULTRA TEST) test strip 1 each by Other route daily. E11.9 100 each 0   lisinopril (ZESTRIL) 20 MG tablet TAKE 1/2 TABLET DAILY 45 tablet 3   metFORMIN (GLUCOPHAGE-XR) 500 MG 24 hr tablet Take 2 tablets (1,000 mg total) by mouth 2 (two) times daily. 360 tablet 1   ONETOUCH DELICA LANCETS MISC by Does not apply route. Check blood sugar once daily     rosuvastatin (CRESTOR) 20 MG tablet Take 1 tablet (20  mg total) by mouth daily. 90 tablet 1   varenicline (CHANTIX) 0.5 MG tablet Take 1 tablet by mouth 2 times daily. 30 tablet 2   No current facility-administered medications on file prior to visit.    Allergies  Allergen Reactions   Pioglitazone     REACTION: edema    Family History  Problem Relation Age of Onset   Heart attack Father 20   Diabetes Mother    Melanoma Sister    Diabetes Maternal Grandmother    Stomach cancer Maternal Grandmother    Diabetes Maternal Grandfather    Stroke Neg Hx    Colon cancer Neg Hx    Esophageal cancer Neg Hx    Rectal cancer Neg Hx    Colon polyps Neg Hx     BP 140/64    Pulse 71    Ht 5\' 6"  (1.676 m)    Wt 211 lb 6.4 oz (95.9 kg)    SpO2 96%    BMI 34.12 kg/m    Review of Systems Denies N/V/HB/bloating    Objective:   Physical Exam    A1c=8.0%    Assessment & Plan:  Type 2 DM: uncontrolled  Patient  Instructions  check your blood sugar once a day.  vary the time of day when you check, between before the 3 meals, and at bedtime.  also check if you have symptoms of your blood sugar being too high or too low.  please keep a record of the readings and bring it to your next appointment here (or you can bring the meter itself).  You can write it on any piece of paper.  please call us sooner if your blood sugar goes below 70, or if you have a lot of readings over 200.   I have sent 2 prescriptions to your pharmacy: to reduce the glimepiride to 1/2 of a 1 mg pill, and to increase the Rybelsus.   Please continue the same other 2 diabetes medications.   Please come back for a follow-up appointment in 2 months.

## 2021-08-21 DIAGNOSIS — G4733 Obstructive sleep apnea (adult) (pediatric): Secondary | ICD-10-CM | POA: Diagnosis not present

## 2021-09-07 ENCOUNTER — Telehealth: Payer: Self-pay

## 2021-09-07 NOTE — Telephone Encounter (Signed)
Attempted to contact patient regarding smoking cessation follow up. However, patient did not answer and a HIPAA compliant voicemail was left. Will attempt to contact patient at a later date. ? ?Kayston Art, Pharm.D. ?PGY-1 Pharmacy Resident ?09/07/2021 1:26 PM ?

## 2021-09-21 DIAGNOSIS — G4733 Obstructive sleep apnea (adult) (pediatric): Secondary | ICD-10-CM | POA: Diagnosis not present

## 2021-10-21 DIAGNOSIS — G4733 Obstructive sleep apnea (adult) (pediatric): Secondary | ICD-10-CM | POA: Diagnosis not present

## 2021-10-23 ENCOUNTER — Encounter: Payer: Self-pay | Admitting: Endocrinology

## 2021-10-23 ENCOUNTER — Ambulatory Visit: Payer: BC Managed Care – PPO | Admitting: Endocrinology

## 2021-10-23 VITALS — BP 110/68 | HR 68 | Ht 66.0 in | Wt 211.6 lb

## 2021-10-23 DIAGNOSIS — E1165 Type 2 diabetes mellitus with hyperglycemia: Secondary | ICD-10-CM

## 2021-10-23 LAB — POCT GLYCOSYLATED HEMOGLOBIN (HGB A1C): Hemoglobin A1C: 8.1 % — AB (ref 4.0–5.6)

## 2021-10-23 MED ORDER — RYBELSUS 14 MG PO TABS: 14.0000 mg | ORAL_TABLET | Freq: Every day | ORAL | 3 refills | Status: DC

## 2021-10-23 MED ORDER — GLIMEPIRIDE 1 MG PO TABS
0.5000 mg | ORAL_TABLET | Freq: Every day | ORAL | 1 refills | Status: DC
Start: 2021-10-23 — End: 2022-04-11

## 2021-10-23 NOTE — Progress Notes (Signed)
? ?Subjective:  ? ? Patient ID: Zachary Clay., male    DOB: June 04, 1961, 61 y.o.   MRN: [id number] ? ?HPI ?Pt returns for f/u of diabetes mellitus:  ?DM type: 2 ?Dx'ed: 1998 ?Complications: CRI.  ?Therapy: 4 oral meds.   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   ?Other: he had edema with pioglitizone in the past; he is a smoker; abd pain limits metformin dosage; He stopped Ozempic, due to nausea.   ?Interval history: He takes meds as rx'ed.  He says cbg varies from 140-200.  Pt says he did not get the prescriptions rx'ed at last ov.   ?Past Medical History:  ?Diagnosis Date  ? Diabetes mellitus   ? Hyperlipidemia   ? Personal history of colonic adenomas 02/16/2013  ? Polycythemia 2012  ? Hgb 17.8  ? ? ?Past Surgical History:  ?Procedure Laterality Date  ? COLONOSCOPY  02/08/2013  ? ESOPHAGOGASTRODUODENOSCOPY  2003  ? [facility]  ? WISDOM TOOTH EXTRACTION    ? ? ?Social History  ? ?Socioeconomic History  ? Marital status: Married  ?  Spouse name: Not on file  ? Number of children: Not on file  ? Years of education: Not on file  ? Highest education level: Not on file  ?Occupational History  ?  Employer: [employer]  ?Tobacco Use  ? Smoking status: Every Day  ?  Packs/day: 3.00  ?  Types: Cigarettes  ? Smokeless tobacco: Never  ? Tobacco comments:  ?  smoked 1977- present, up to 3 ppd  ?Vaping Use  ? Vaping Use: Never used  ?Substance and Sexual Activity  ? Alcohol use: Yes  ?  Alcohol/week: 14.0 standard drinks  ?  Types: 14 Cans of beer per week  ?  Comment: Beer  ? Drug use: No  ? Sexual activity: Not on file  ?Other Topics Concern  ? Not on file  ?Social History Narrative  ? Not on file  ? ?Social Determinants of Health  ? ?Financial Resource Strain: Not on file  ?Food Insecurity: Not on file  ?Transportation Needs: Not on file  ?Physical Activity: Not on file  ?Stress: Not on file  ?Social Connections: Not on  file  ?Intimate Partner Violence: Not on file  ? ? ?Current Outpatient Medications on File Prior to Visit  ?Medication Sig Dispense Refill  ? cetirizine (ZYRTEC) 10 MG tablet Take 1 tablet (10 mg total) by mouth daily. 30 tablet 11  ? clotrimazole-betamethasone (LOTRISONE) cream APPLY 1 APPLICATION 2 (TWO) TIMES DAILY TOPICALLY. 135 g 1  ? esomeprazole (NEXIUM) 40 MG capsule TAKE 1 CAPSULE DAILY 90 capsule 1  ? FARXIGA 10 MG TABS tablet TAKE 1 TABLET DAILY 90 tablet 1  ? fenofibrate 160 MG tablet TAKE 1 TABLET DAILY 90 tablet 1  ? glucose blood (ONE TOUCH ULTRA TEST) test strip 1 each by Other route daily. E11.9 100 each 0  ? lisinopril (ZESTRIL) 20 MG tablet TAKE 1/2 TABLET DAILY 45 tablet 3  ? metFORMIN (GLUCOPHAGE-XR) 500 MG 24 hr tablet Take 2 tablets (1,000 mg total) by mouth 2 (two) times daily. 360 tablet 1  ? ONETOUCH DELICA LANCETS MISC by Does not apply route. Check blood sugar once daily    ? rosuvastatin (CRESTOR) 20 MG tablet Take 1 tablet (20 mg total) by mouth daily. 90 tablet 1  ? varenicline (CHANTIX) 0.5 MG tablet Take 1  tablet by mouth 2 times daily. (Patient not taking: Reported on 10/24/2021) 30 tablet 2  ? ?No current facility-administered medications on file prior to visit.  ? ? ?Allergies  ?Allergen Reactions  ? Pioglitazone   ?  REACTION: edema  ? ? ?Family History  ?Problem Relation Age of Onset  ? Heart attack Father 67  ? Diabetes Mother   ? Melanoma Sister   ? Diabetes Maternal Grandmother   ? Stomach cancer Maternal Grandmother   ? Diabetes Maternal Grandfather   ? Stroke Neg Hx   ? Colon cancer Neg Hx   ? Esophageal cancer Neg Hx   ? Rectal cancer Neg Hx   ? Colon polyps Neg Hx   ? ? ?BP 110/68 (BP Location: Left Arm, Patient Position: Sitting, Cuff Size: Normal)   Pulse 68   Ht '5\' 6"'$  (1.676 m)   Wt 211 lb 9.6 oz (96 kg)   SpO2 93%   BMI 34.15 kg/m?  ? ? ?Review of Systems ?He denies hypoglycemia.   ?   ?Objective:  ? Physical Exam ?VITAL SIGNS:  See vs page ?GENERAL: no  distress ? ?Lab Results  ?Component Value Date  ? CREATININE 1.35 04/25/2021  ? BUN 19 04/25/2021  ? NA 137 04/25/2021  ? K 4.7 04/25/2021  ? CL 99 04/25/2021  ? CO2 29 04/25/2021  ? ? ?A1c=8.1% ?   ?Assessment & Plan:  ?Type 2 DM: uncontrolled ? ?Patient Instructions  ?check your blood sugar once a day.  vary the time of day when you check, between before the 3 meals, and at bedtime.  also check if you have symptoms of your blood sugar being too high or too low.  please keep a record of the readings and bring it to your next appointment here (or you can bring the meter itself).  You can write it on any piece of paper.  please call us sooner if your blood sugar goes below 70, or if you have a lot of readings over 200.   ?I have sent 2 prescriptions to your [facility]: to reduce the glimepiride to 1/2 of a 1 mg pill, and to increase the Rybelsus ?Please continue the same other 2 diabetes medications.   ?You should have an endocrinology follow-up appointment in 3 months.   ?  ? ? ? ? ?

## 2021-10-23 NOTE — Patient Instructions (Addendum)
check your blood sugar once a day.  vary the time of day when you check, between before the 3 meals, and at bedtime.  also check if you have symptoms of your blood sugar being too high or too low.  please keep a record of the readings and bring it to your next appointment here (or you can bring the meter itself).  You can write it on any piece of paper.  please call us sooner if your blood sugar goes below 70, or if you have a lot of readings over 200.   ?I have sent 2 prescriptions to your [facility]: to reduce the glimepiride to 1/2 of a 1 mg pill, and to increase the Rybelsus ?Please continue the same other 2 diabetes medications.   ?You should have an endocrinology follow-up appointment in 3 months.   ?  ? ? ?

## 2021-10-24 ENCOUNTER — Encounter: Payer: Self-pay | Admitting: Family Medicine

## 2021-10-24 ENCOUNTER — Ambulatory Visit: Payer: BC Managed Care – PPO | Admitting: Family Medicine

## 2021-10-24 VITALS — BP 126/78 | HR 64 | Temp 98.8°F | Resp 16 | Ht 66.0 in | Wt 212.6 lb

## 2021-10-24 DIAGNOSIS — D582 Other hemoglobinopathies: Secondary | ICD-10-CM

## 2021-10-24 DIAGNOSIS — E669 Obesity, unspecified: Secondary | ICD-10-CM | POA: Diagnosis not present

## 2021-10-24 DIAGNOSIS — E785 Hyperlipidemia, unspecified: Secondary | ICD-10-CM

## 2021-10-24 DIAGNOSIS — I10 Essential (primary) hypertension: Secondary | ICD-10-CM

## 2021-10-24 DIAGNOSIS — E1169 Type 2 diabetes mellitus with other specified complication: Secondary | ICD-10-CM | POA: Diagnosis not present

## 2021-10-24 LAB — HEPATIC FUNCTION PANEL
ALT: 20 U/L (ref 0–53)
AST: 20 U/L (ref 0–37)
Albumin: 5.3 g/dL — ABNORMAL HIGH (ref 3.5–5.2)
Alkaline Phosphatase: 47 U/L (ref 39–117)
Bilirubin, Direct: 0.1 mg/dL (ref 0.0–0.3)
Total Bilirubin: 0.7 mg/dL (ref 0.2–1.2)
Total Protein: 8 g/dL (ref 6.0–8.3)

## 2021-10-24 LAB — BASIC METABOLIC PANEL
BUN: 22 mg/dL (ref 6–23)
CO2: 29 mEq/L (ref 19–32)
Calcium: 10.4 mg/dL (ref 8.4–10.5)
Chloride: 97 mEq/L (ref 96–112)
Creatinine, Ser: 1.38 mg/dL (ref 0.40–1.50)
GFR: 55.1 mL/min — ABNORMAL LOW (ref >60.00)
Glucose, Bld: 115 mg/dL — ABNORMAL HIGH (ref 70–99)
Potassium: 4.8 mEq/L (ref 3.5–5.1)
Sodium: 135 mEq/L (ref 135–145)

## 2021-10-24 LAB — CBC WITH DIFFERENTIAL/PLATELET
Basophils Absolute: 0.1 10*3/uL (ref 0.0–0.1)
Basophils Relative: 0.8 % (ref 0.0–3.0)
Eosinophils Absolute: 0.2 10*3/uL (ref 0.0–0.7)
Eosinophils Relative: 2.9 % (ref 0.0–5.0)
HCT: 54.3 % — ABNORMAL HIGH (ref 39.0–52.0)
Hemoglobin: 18.2 g/dL (ref 13.0–17.0)
Lymphocytes Relative: 24.3 % (ref 12.0–46.0)
Lymphs Abs: 2.1 10*3/uL (ref 0.7–4.0)
MCHC: 33.5 g/dL (ref 30.0–36.0)
MCV: 89.3 fl (ref 78.0–100.0)
Monocytes Absolute: 0.7 10*3/uL (ref 0.1–1.0)
Monocytes Relative: 8.5 % (ref 3.0–12.0)
Neutro Abs: 5.4 10*3/uL (ref 1.4–7.7)
Neutrophils Relative %: 63.5 % (ref 43.0–77.0)
Platelets: 205 10*3/uL (ref 150.0–400.0)
RBC: 6.07 Mil/uL — ABNORMAL HIGH (ref 4.22–5.81)
RDW: 13.3 % (ref 11.5–15.5)
WBC: 8.5 10*3/uL (ref 4.0–10.5)

## 2021-10-24 LAB — MICROALBUMIN / CREATININE URINE RATIO
Creatinine,U: 19.6 mg/dL
Microalb Creat Ratio: 57.1 mg/g — ABNORMAL HIGH (ref 0.0–30.0)
Microalb, Ur: 11.2 mg/dL — ABNORMAL HIGH (ref 0.0–1.9)

## 2021-10-24 LAB — LIPID PANEL
Cholesterol: 159 mg/dL (ref 0–200)
HDL: 39.3 mg/dL (ref >39.00)
LDL Cholesterol: 90 mg/dL (ref 0–99)
NonHDL: 119.66
Total CHOL/HDL Ratio: 4
Triglycerides: 148 mg/dL (ref 0.0–149.0)
VLDL: 29.6 mg/dL (ref 0.0–40.0)

## 2021-10-24 LAB — TSH: TSH: 3.21 u[IU]/mL (ref 0.35–5.50)

## 2021-10-24 NOTE — Assessment & Plan Note (Signed)
Ongoing issue for pt.  BMI 34.31  No regular exercise.  Encouraged low carb diet and regular physical activity.  Will follow. ?

## 2021-10-24 NOTE — Progress Notes (Signed)
? ?  Subjective:  ? ? Patient ID: Zachary Levee., male    DOB: Jun 07, 1961, 61 y.o.   MRN: [id number] ? ?HPI ?HTN- chronic problem, on Lisinopril '10mg'$  daily w/ good control.  No CP, SOB, HAs, visual changes, edema. ? ?Hyperlipidemia- chronic problem, on Fenofibrate '150mg'$  daily and Crestor '20mg'$  daily.  Last LDL 143.  No abd pain, N/V. ? ?Obesity- pt's BMI 34.31.  no regular exercise. ? ?DM- chronic problem, following w/ Endo.  Was seen yesterday.  Fingerstick A1C 8.1%  Due for microalbumin.  UTD on foot exam.  Due for eye exam.  Denies symptomatic lows. ? ? ?Review of Systems ?For ROS see HPI  ?   ?Objective:  ? Physical Exam ?Vitals reviewed.  ?Constitutional:   ?   General: He is not in acute distress. ?   Appearance: Normal appearance. He is well-developed. He is obese. He is not ill-appearing.  ?   Comments: Smells of cigarettes  ?HENT:  ?   Head: Normocephalic and atraumatic.  ?Eyes:  ?   Extraocular Movements: Extraocular movements intact.  ?   Conjunctiva/sclera: Conjunctivae normal.  ?   Pupils: Pupils are equal, round, and reactive to light.  ?Neck:  ?   Thyroid: No thyromegaly.  ?Cardiovascular:  ?   Rate and Rhythm: Normal rate and regular rhythm.  ?   Pulses: Normal pulses.  ?   Heart sounds: Normal heart sounds. No murmur heard. ?Pulmonary:  ?   Effort: Pulmonary effort is normal. No respiratory distress.  ?   Breath sounds: Normal breath sounds.  ?Abdominal:  ?   General: Bowel sounds are normal. There is no distension.  ?   Palpations: Abdomen is soft.  ?Musculoskeletal:  ?   Cervical back: Normal range of motion and neck supple.  ?   Right lower leg: No edema.  ?   Left lower leg: No edema.  ?Lymphadenopathy:  ?   Cervical: No cervical adenopathy.  ?Skin: ?   General: Skin is warm and dry.  ?Neurological:  ?   General: No focal deficit present.  ?   Mental Status: He is alert and oriented to person, place, and time.  ?   Cranial Nerves: No cranial nerve deficit.  ?Psychiatric:     ?   Mood and Affect:  Mood normal.     ?   Behavior: Behavior normal.  ? ? ? ? ? ?   ?Assessment & Plan:  ? ? ?

## 2021-10-24 NOTE — Assessment & Plan Note (Signed)
Chronic problem.  Well controlled on Lisinopril '10mg'$  daily.  Currently asymptomatic.  Check labs due to ACE but no anticipated med changes.  Will follow. ?

## 2021-10-24 NOTE — Patient Instructions (Addendum)
Schedule your complete physical in 6 months ?We'll notify you of your lab results and make any changes if needed ?Continue to work on low carb diet and regular exercise- you can do it!! ?Schedule your eye exam!! ?Try and quit smoking!! ?Call and schedule a dermatology appt w/ Dr Nevada Crane [phone] ?Call with any questions or concerns ?Have a great summer!!! ?

## 2021-10-24 NOTE — Assessment & Plan Note (Signed)
Chronic problem.  On Fenofibrate and Crestor w/o difficulty.  Last LDL 143.  Check labs.  Adjust meds prn  ?

## 2021-10-25 ENCOUNTER — Telehealth: Payer: Self-pay

## 2021-10-25 NOTE — Addendum Note (Signed)
Addended by: Midge Minium on: 10/25/2021 07:38 AM ? ? Modules accepted: Orders ? ?

## 2021-10-25 NOTE — Telephone Encounter (Signed)
-----   Message from Midge Minium, MD sent at 10/25/2021  7:38 AM EDT ----- ?Your hemoglobin level (blood count) is elevated.  This is something we commonly see in smokers and people who have not had much water before they had their blood work done.  Please increase your water intake and we'll repeat your CBC at a lab only visit in 1-2 weeks (ordered) ? ?Remainder of labs are stable ?

## 2021-10-25 NOTE — Telephone Encounter (Signed)
Spoke to pt and informed him of his lab results . Advised him he needs to return to clinic in 1 to 2 wks to repeat his CBC and that is ordered . Pt expressed verbal understanding .  ?

## 2021-11-08 ENCOUNTER — Telehealth: Payer: Self-pay

## 2021-11-08 ENCOUNTER — Other Ambulatory Visit (INDEPENDENT_AMBULATORY_CARE_PROVIDER_SITE_OTHER): Payer: BC Managed Care – PPO

## 2021-11-08 DIAGNOSIS — D582 Other hemoglobinopathies: Secondary | ICD-10-CM | POA: Diagnosis not present

## 2021-11-08 LAB — CBC WITH DIFFERENTIAL/PLATELET
Basophils Absolute: 0.1 10*3/uL (ref 0.0–0.1)
Basophils Relative: 0.8 % (ref 0.0–3.0)
Eosinophils Absolute: 0.3 10*3/uL (ref 0.0–0.7)
Eosinophils Relative: 3.7 % (ref 0.0–5.0)
HCT: 50.1 % (ref 39.0–52.0)
Hemoglobin: 17 g/dL (ref 13.0–17.0)
Lymphocytes Relative: 20.7 % (ref 12.0–46.0)
Lymphs Abs: 1.6 10*3/uL (ref 0.7–4.0)
MCHC: 34 g/dL (ref 30.0–36.0)
MCV: 88.2 fl (ref 78.0–100.0)
Monocytes Absolute: 0.9 10*3/uL (ref 0.1–1.0)
Monocytes Relative: 10.8 % (ref 3.0–12.0)
Neutro Abs: 5 10*3/uL (ref 1.4–7.7)
Neutrophils Relative %: 64 % (ref 43.0–77.0)
Platelets: 200 10*3/uL (ref 150.0–400.0)
RBC: 5.68 Mil/uL (ref 4.22–5.81)
RDW: 13.3 % (ref 11.5–15.5)
WBC: 7.9 10*3/uL (ref 4.0–10.5)

## 2021-11-08 NOTE — Telephone Encounter (Signed)
Spoke w/ pt and advised of his lab results

## 2021-11-08 NOTE — Telephone Encounter (Signed)
-----   Message from Midge Minium, MD sent at 11/08/2021  3:51 PM EDT ----- Your hemoglobin is back in normal range.  This is great news!

## 2021-11-13 ENCOUNTER — Other Ambulatory Visit: Payer: Self-pay | Admitting: Family Medicine

## 2021-11-13 DIAGNOSIS — E1169 Type 2 diabetes mellitus with other specified complication: Secondary | ICD-10-CM

## 2021-11-21 DIAGNOSIS — G4733 Obstructive sleep apnea (adult) (pediatric): Secondary | ICD-10-CM | POA: Diagnosis not present

## 2021-12-21 DIAGNOSIS — G4733 Obstructive sleep apnea (adult) (pediatric): Secondary | ICD-10-CM | POA: Diagnosis not present

## 2022-01-21 DIAGNOSIS — G4733 Obstructive sleep apnea (adult) (pediatric): Secondary | ICD-10-CM | POA: Diagnosis not present

## 2022-01-27 ENCOUNTER — Other Ambulatory Visit: Payer: Self-pay | Admitting: Family Medicine

## 2022-01-27 DIAGNOSIS — E1165 Type 2 diabetes mellitus with hyperglycemia: Secondary | ICD-10-CM

## 2022-01-27 DIAGNOSIS — E1169 Type 2 diabetes mellitus with other specified complication: Secondary | ICD-10-CM

## 2022-01-28 NOTE — Progress Notes (Unsigned)
Patient ID: Zachary Clay., male   DOB: September 25, 1960, 61 y.o.   MRN: [id number]  HPI: Zachary Clay. is a 61 y.o.-year-old male, returning for follow-up for DM2, dx in 1998, non-insulin-dependent, uncontrolled, with complications (CKD). Pt. previously saw Dr. Loanne Drilling, last visit 3 months ago.  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 He tried Ozempic but this caused nausea. He tried Actos but developed edema.  Pt checks his sugars *** a day and they are: - am: n/c - 2h after b'fast: n/c - before lunch: n/c - 2h after lunch: n/c - before dinner: n/c - 2h after dinner: n/c - bedtime: n/c - nighttime: n/c Lowest sugar was ***; he has hypoglycemia awareness at 70.  Highest sugar was ***.  Glucometer: One Touch ultra  Pt's meals are: - Breakfast: - Lunch: - Dinner: - Snacks:  -+ Mild CKD, last BUN/creatinine:  Lab Results  Component Value Date   BUN 22 10/24/2021   BUN 19 04/25/2021   CREATININE 1.38 10/24/2021   CREATININE 1.35 04/25/2021  On lisinopril 10 mg daily, Farxiga 10 mg daily.  -+ HL; last set of lipids: Lab Results  Component Value Date   CHOL 159 10/24/2021   HDL 39.30 10/24/2021   LDLCALC 90 10/24/2021   LDLDIRECT 129.0 04/29/2017   TRIG 148.0 10/24/2021   CHOLHDL 4 10/24/2021  On Crestor 10 mg daily and fenofibrate 160 mg daily.  - last eye exam was in 2020. No DR.   - no numbness  and tingling in his feet.  Last foot exam was on 01/04/2021.  ROS: + see HPI No increased urination, blurry vision, nausea, chest pain.  Past Medical History:  Diagnosis Date   Diabetes mellitus    Hyperlipidemia    Personal history of colonic adenomas 02/16/2013   Polycythemia 2012   Hgb 17.8   Past Surgical History:  Procedure Laterality Date   COLONOSCOPY  02/08/2013   ESOPHAGOGASTRODUODENOSCOPY  2003   [facility]   WISDOM TOOTH EXTRACTION     Social History   Socioeconomic History   Marital status: Married    Spouse name: Not on file   Number of children: Not on file   Years of education: Not on file   Highest education level: Not on file  Occupational History    Employer: [employer]  Tobacco Use   Smoking status: Every Day    Packs/day: 3.00    Types: Cigarettes   Smokeless tobacco: Never   Tobacco comments:    smoked 1977- present, up to 3 ppd  Vaping Use   Vaping Use: Never used  Substance and Sexual Activity   Alcohol use: Yes    Alcohol/week: 14.0 standard drinks of alcohol    Types: 14 Cans of beer per week    Comment: Beer   Drug use: No  Sexual activity: Not on file  Other Topics Concern   Not on file  Social History Narrative   Not on file   Social Determinants of Health   Financial Resource Strain: Not on file  Food Insecurity: Not on file  Transportation Needs: Not on file  Physical Activity: Not on file  Stress: Not on file  Social Connections: Not on file  Intimate Partner Violence: Not on file   Current Outpatient Medications on File Prior to Visit  Medication Sig Dispense Refill   cetirizine (ZYRTEC) 10 MG tablet Take 1 tablet (10 mg total) by mouth daily. 30 tablet 11   clotrimazole-betamethasone (LOTRISONE) cream APPLY 1 APPLICATION 2 (TWO) TIMES DAILY TOPICALLY. 135 g 1   esomeprazole (NEXIUM) 40 MG capsule TAKE 1 CAPSULE DAILY 90 capsule 1   FARXIGA 10 MG TABS tablet TAKE 1 TABLET DAILY 90 tablet 1   fenofibrate 160 MG  tablet TAKE 1 TABLET DAILY 90 tablet 1   glimepiride (AMARYL) 1 MG tablet Take 0.5 tablets (0.5 mg total) by mouth daily with breakfast. 45 tablet 1   glucose blood (ONE TOUCH ULTRA TEST) test strip 1 each by Other route daily. E11.9 100 each 0   lisinopril (ZESTRIL) 20 MG tablet TAKE 1/2 TABLET DAILY 45 tablet 3   metFORMIN (GLUCOPHAGE-XR) 500 MG 24 hr tablet Take 2 tablets (1,000 mg total) by mouth 2 (two) times daily. 360 tablet 1   ONETOUCH DELICA LANCETS MISC by Does not apply route. Check blood sugar once daily     rosuvastatin (CRESTOR) 20 MG tablet TAKE 1 TABLET DAILY 90 tablet 1   Semaglutide (RYBELSUS) 14 MG TABS Take 1 tablet (14 mg total) by mouth daily. 90 tablet 3   varenicline (CHANTIX) 0.5 MG tablet Take 1 tablet by mouth 2 times daily. (Patient not taking: Reported on 10/24/2021) 30 tablet 2   No current facility-administered medications on file prior to visit.   Allergies  Allergen Reactions   Pioglitazone     REACTION: edema   Family History  Problem Relation Age of Onset   Heart attack Father 43   Diabetes Mother    Melanoma Sister    Diabetes Maternal Grandmother    Stomach cancer Maternal Grandmother    Diabetes Maternal Grandfather    Stroke Neg Hx    Colon cancer Neg Hx    Esophageal cancer Neg Hx    Rectal cancer Neg Hx    Colon polyps Neg Hx    PE: There were no vitals taken for this visit. Wt Readings from Last 3 Encounters:  10/24/21 212 lb 9.6 oz (96.4 kg)  10/23/21 211 lb 9.6 oz (96 kg)  08/15/21 211 lb 6.4 oz (95.9 kg)   Constitutional: overweight, in NAD Eyes: no exophthalmos ENT: moist mucous membranes, no thyromegaly, no cervical lymphadenopathy Cardiovascular: RRR, No MRG Respiratory: CTA B Musculoskeletal: no deformities Skin: moist, warm, no rashes Neurological: no tremor with outstretched hands  ASSESSMENT: 1. DM2, non-insulin-dependent, uncontrolled, with complications - CKD  2. HL  PLAN:  1. Patient with long-standing,  uncontrolled diabetes, on oral antidiabetic regimen, with still poor control.  Latest HbA1c was 8.1%, higher, at last visit with Dr. Loanne Drilling.  At today's visit, HbA1c is: **. -  - I suggested to:  There are no Patient Instructions on file for this visit. - check sugars at different times of the day - check 1x a day, rotating checks - discussed about CBG targets for treatment: 80-130 mg/dL before meals and <180  mg/dL after meals; target HbA1c <7%. - given sugar log and advised how to fill it and to bring it at next appt  - given foot care handout  - given instructions for hypoglycemia management "15-15 rule"  - advised for yearly eye exams  - Return to clinic in 3 mo with sugar log  2. HL - Reviewed latest lipid panel from 10/2021: LDL above goal, the rest the fractions at goal: Lab Results  Component Value Date   CHOL 159 10/24/2021   HDL 39.30 10/24/2021   LDLCALC 90 10/24/2021   LDLDIRECT 129.0 04/29/2017   TRIG 148.0 10/24/2021   CHOLHDL 4 10/24/2021  - Continues Crestor 20 mg daily and fenofibrate 160 mg daily without side effects.  Philemon Kingdom, MD PhD [facility] Endocrinology

## 2022-01-29 ENCOUNTER — Encounter: Payer: Self-pay | Admitting: Internal Medicine

## 2022-01-29 ENCOUNTER — Ambulatory Visit: Payer: BC Managed Care – PPO | Admitting: Internal Medicine

## 2022-01-29 VITALS — BP 120/70 | HR 78 | Ht 66.0 in | Wt 206.0 lb

## 2022-01-29 DIAGNOSIS — E785 Hyperlipidemia, unspecified: Secondary | ICD-10-CM

## 2022-01-29 DIAGNOSIS — N189 Chronic kidney disease, unspecified: Secondary | ICD-10-CM

## 2022-01-29 DIAGNOSIS — E1122 Type 2 diabetes mellitus with diabetic chronic kidney disease: Secondary | ICD-10-CM

## 2022-01-29 LAB — POCT GLYCOSYLATED HEMOGLOBIN (HGB A1C): Hemoglobin A1C: 7.1 % — AB (ref 4.0–5.6)

## 2022-01-29 NOTE — Patient Instructions (Addendum)
Please continue: - Metformin ER 1000 mg 2x a day - Glimepiride 0.5 mg before breakfast - Rybelsus 14 mg before breakfast  - Farxiga 10 mg before breakfast  Stop Januvia.  Please return in 4 months with your sugar log.   PATIENT INSTRUCTIONS FOR TYPE 2 DIABETES:  DIET AND EXERCISE Diet and exercise is an important part of diabetic treatment.  We recommended aerobic exercise in the form of brisk walking (working between 40-60% of maximal aerobic capacity, similar to brisk walking) for 150 minutes per week (such as 30 minutes five days per week) along with 3 times per week performing 'resistance' training (using various gauge rubber tubes with handles) 5-10 exercises involving the major muscle groups (upper body, lower body and core) performing 10-15 repetitions (or near fatigue) each exercise. Start at half the above goal but build slowly to reach the above goals. If limited by weight, joint pain, or disability, we recommend daily walking in a swimming pool with water up to waist to reduce pressure from joints while allow for adequate exercise.    BLOOD GLUCOSES Monitoring your blood glucoses is important for continued management of your diabetes. Please check your blood glucoses 2-4 times a day: fasting, before meals and at bedtime (you can rotate these measurements - e.g. one day check before the 3 meals, the next day check before 2 of the meals and before bedtime, etc.).   HYPOGLYCEMIA (low blood sugar) Hypoglycemia is usually a reaction to not eating, exercising, or taking too much insulin/ other diabetes drugs.  Symptoms include tremors, sweating, hunger, confusion, headache, etc. Treat IMMEDIATELY with 15 grams of Carbs: 4 glucose tablets  cup regular juice/soda 2 tablespoons raisins 4 teaspoons sugar 1 tablespoon honey Recheck blood glucose in 15 mins and repeat above if still symptomatic/blood glucose <100.  RECOMMENDATIONS TO REDUCE YOUR RISK OF DIABETIC COMPLICATIONS: * Take  your prescribed MEDICATION(S) * Follow a DIABETIC diet: Complex carbs, fiber rich foods, (monounsaturated and polyunsaturated) fats * AVOID saturated/trans fats, high fat foods, >2,300 mg salt per day. * EXERCISE at least 5 times a week for 30 minutes or preferably daily.  * DO NOT SMOKE OR DRINK more than 1 drink a day. * Check your FEET every day. Do not wear tightfitting shoes. Contact us if you develop an ulcer * See your EYE doctor once a year or more if needed * Get a FLU shot once a year * Get a PNEUMONIA vaccine once before and once after age 84 years  GOALS:  * Your Hemoglobin A1c of <7%  * fasting sugars need to be <130 * after meals sugars need to be <180 (2h after you start eating) * Your Systolic BP should be 465 or lower  * Your Diastolic BP should be 80 or lower  * Your HDL (Good Cholesterol) should be 40 or higher  * Your LDL (Bad Cholesterol) should be 100 or lower. * Your Triglycerides should be 150 or lower  * Your Urine microalbumin (kidney function) should be <30 * Your Body Mass Index should be 25 or lower    Please consider the following ways to cut down carbs and fat and increase fiber and micronutrients in your diet: - substitute whole grain for white bread or pasta - substitute brown rice for white rice - substitute 90-calorie flat bread pieces for slices of bread when possible - substitute sweet potatoes or yams for white potatoes - substitute humus for margarine - substitute tofu for cheese when possible - substitute  almond or rice milk for regular milk (would not drink soy milk daily due to concern for soy estrogen influence on breast cancer risk) - substitute dark chocolate for other sweets when possible - substitute water - can add lemon or orange slices for taste - for diet sodas (artificial sweeteners will trick your body that you can eat sweets without getting calories and will lead you to overeating and weight gain in the long run) - do not skip  breakfast or other meals (this will slow down the metabolism and will result in more weight gain over time)  - can try smoothies made from fruit and almond/rice milk in am instead of regular breakfast - can also try old-fashioned (not instant) oatmeal made with almond/rice milk in am - order the dressing on the side when eating salad at a restaurant (pour less than half of the dressing on the salad) - eat as little meat as possible - can try juicing, but should not forget that juicing will get rid of the fiber, so would alternate with eating raw veg./fruits or drinking smoothies - use as little oil as possible, even when using olive oil - can dress a salad with a mix of balsamic vinegar and lemon juice, for e.g. - use agave nectar, stevia sugar, or regular sugar rather than artificial sweateners - steam or broil/roast veggies  - snack on veggies/fruit/nuts (unsalted, preferably) when possible, rather than processed foods - reduce or eliminate aspartame in diet (it is in diet sodas, chewing gum, etc) Read the labels!  Try to read Dr. Janene Harvey book: "Program for Reversing Diabetes" for other ideas for healthy eating.

## 2022-01-29 NOTE — Addendum Note (Signed)
Addended by: Jefferson Fuel on: 01/29/2022 08:59 AM   Modules accepted: Orders

## 2022-02-21 DIAGNOSIS — G4733 Obstructive sleep apnea (adult) (pediatric): Secondary | ICD-10-CM | POA: Diagnosis not present

## 2022-03-14 ENCOUNTER — Other Ambulatory Visit: Payer: Self-pay

## 2022-03-14 DIAGNOSIS — E1122 Type 2 diabetes mellitus with diabetic chronic kidney disease: Secondary | ICD-10-CM

## 2022-03-14 MED ORDER — RYBELSUS 14 MG PO TABS: 14.0000 mg | ORAL_TABLET | Freq: Every day | ORAL | 3 refills | Status: DC

## 2022-03-23 DIAGNOSIS — G4733 Obstructive sleep apnea (adult) (pediatric): Secondary | ICD-10-CM | POA: Diagnosis not present

## 2022-04-04 DIAGNOSIS — G4733 Obstructive sleep apnea (adult) (pediatric): Secondary | ICD-10-CM | POA: Diagnosis not present

## 2022-04-11 ENCOUNTER — Other Ambulatory Visit: Payer: Self-pay

## 2022-04-11 DIAGNOSIS — E1122 Type 2 diabetes mellitus with diabetic chronic kidney disease: Secondary | ICD-10-CM

## 2022-04-11 MED ORDER — GLIMEPIRIDE 1 MG PO TABS: 0.5000 mg | ORAL_TABLET | Freq: Every day | ORAL | 1 refills | Status: DC

## 2022-04-12 ENCOUNTER — Other Ambulatory Visit: Payer: Self-pay | Admitting: Family Medicine

## 2022-04-12 DIAGNOSIS — E1165 Type 2 diabetes mellitus with hyperglycemia: Secondary | ICD-10-CM

## 2022-04-14 ENCOUNTER — Other Ambulatory Visit: Payer: Self-pay | Admitting: Family Medicine

## 2022-04-14 DIAGNOSIS — E1169 Type 2 diabetes mellitus with other specified complication: Secondary | ICD-10-CM

## 2022-06-10 ENCOUNTER — Ambulatory Visit: Payer: BC Managed Care – PPO | Admitting: Internal Medicine

## 2022-06-10 ENCOUNTER — Encounter: Payer: Self-pay | Admitting: Internal Medicine

## 2022-06-10 VITALS — BP 138/80 | HR 61 | Ht 66.0 in | Wt 210.2 lb

## 2022-06-10 DIAGNOSIS — E1122 Type 2 diabetes mellitus with diabetic chronic kidney disease: Secondary | ICD-10-CM | POA: Diagnosis not present

## 2022-06-10 DIAGNOSIS — E785 Hyperlipidemia, unspecified: Secondary | ICD-10-CM | POA: Diagnosis not present

## 2022-06-10 LAB — POCT GLYCOSYLATED HEMOGLOBIN (HGB A1C): Hemoglobin A1C: 8 % — AB (ref 4.0–5.6)

## 2022-06-10 MED ORDER — SEMAGLUTIDE(0.25 OR 0.5MG/DOS) 2 MG/3ML ~~LOC~~ SOPN: 0.5000 mg | PEN_INJECTOR | SUBCUTANEOUS | 3 refills | Status: DC

## 2022-06-10 NOTE — Progress Notes (Signed)
Patient ID: Zachary Clay., male   DOB: 1961/04/29, 61 y.o.   MRN: [id number]  HPI: Zachary Clay. is a 61 y.o.-year-old male, returning for follow-up for DM2, dx in 1998, non-insulin-dependent, uncontrolled, with complications (CKD). Pt. previously saw Dr. Loanne Drilling, but last visit with me 4 months ago.  Interim history: No increased urination, blurry vision, nausea, chest pain.  Reviewed HbA1c: Lab Results  Component Value Date   HGBA1C 7.1 (A) 01/29/2022   HGBA1C 8.1 (A) 10/23/2021   HGBA1C 8.0 (A) 08/15/2021   HGBA1C 8.0 (A) 05/14/2021   HGBA1C 7.9 (H) 04/25/2021   HGBA1C 8.7 (A) 09/27/2020   HGBA1C 9.5 (H) 08/31/2020   HGBA1C 7.4 (A) 12/23/2019   HGBA1C 8.1 (A) 10/21/2019   HGBA1C 7.4 (A) 08/11/2019   Pt is on a regimen of: - Metformin ER 1000 >> 500 mg 2x a day - a little nausea - Glimepiride 0.5 mg before breakfast (decreased 10/2021) - Rybelsus 14 mg before breakfast (increased 10/2021) - Januvia 100 mg daily in am >> stopped 01/2022 - Farxiga 10 mg before breakfast 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 He lost weight on it. He tried Trulicity >> pbs with the injector. He tried Actos but developed edema.  At last visit, he was not checking his blood sugars. His meter broke 1-2 mo ago. - am: n/c >> 128-200 (ave 150) - 2h after b'fast: n/c - before lunch: n/c - 2h after lunch: n/c - before dinner: n/c - 2h after dinner: n/c - bedtime: n/c - nighttime: n/c Lowest sugar was 120 >> 128. Highest sugar was 210 >> 200.  Glucometer: One Touch ultra  -+ Mild CKD, last BUN/creatinine:  Lab Results  Component Value Date   BUN 22 10/24/2021   BUN 19 04/25/2021   CREATININE 1.38 10/24/2021   CREATININE 1.35 04/25/2021  On lisinopril 10 mg daily, Farxiga 10 mg daily.  -+ HL; last set of lipids: Lab Results  Component Value Date   CHOL 159 10/24/2021   HDL 39.30 10/24/2021   LDLCALC 90 10/24/2021    LDLDIRECT 129.0 04/29/2017   TRIG 148.0 10/24/2021   CHOLHDL 4 10/24/2021  On Crestor 10 mg daily and fenofibrate 160 mg daily.  - last eye exam was in 11/2021. No DR reportedly. + cataracts. Blepharoptosis.  - no numbness and tingling in his feet.  Last foot exam was on 01/24/2022.  ROS: + see HPI  Past Medical History:  Diagnosis Date   Diabetes mellitus    Hyperlipidemia    Personal history of colonic adenomas 02/16/2013   Polycythemia 2012   Hgb 17.8   Past Surgical History:  Procedure Laterality Date   COLONOSCOPY  02/08/2013   ESOPHAGOGASTRODUODENOSCOPY  2003   [facility]   WISDOM TOOTH EXTRACTION     Social History   Socioeconomic History   Marital status: Married    Spouse name: Not on file   Number of children: Not on file   Years of education: Not on file   Highest education level: Not on file  Occupational History    Employer: [employer]  Tobacco Use   Smoking status: Every Day    Packs/day: 3.00    Types: Cigarettes   Smokeless tobacco: Never   Tobacco comments:    smoked 1977- present, up to 3 ppd  Vaping Use   Vaping Use: Never used  Substance and Sexual Activity   Alcohol  use: Yes    Alcohol/week: 14.0 standard drinks of alcohol    Types: 14 Cans of beer per week    Comment: Beer   Drug use: No   Sexual activity: Not on file  Other Topics Concern   Not on file  Social History Narrative   Not on file   Social Determinants of Health   Financial Resource Strain: Not on file  Food Insecurity: Not on file  Transportation Needs: Not on file  Physical Activity: Not on file  Stress: Not on file  Social Connections: Not on file  Intimate Partner Violence: Not on file   Current Outpatient Medications on File Prior to Visit  Medication Sig Dispense Refill   cetirizine (ZYRTEC) 10 MG tablet Take 1 tablet (10 mg total) by mouth daily. 30 tablet 11   clotrimazole-betamethasone (LOTRISONE) cream APPLY 1 APPLICATION 2 (TWO) TIMES DAILY  TOPICALLY. 135 g 1   esomeprazole (NEXIUM) 40 MG capsule TAKE 1 CAPSULE DAILY 90 capsule 1   FARXIGA 10 MG TABS tablet TAKE 1 TABLET DAILY 90 tablet 1   fenofibrate 160 MG tablet TAKE 1 TABLET DAILY 90 tablet 1   glimepiride (AMARYL) 1 MG tablet Take 0.5 tablets (0.5 mg total) by mouth daily with breakfast. 45 tablet 1   glucose blood (ONE TOUCH ULTRA TEST) test strip 1 each by Other route daily. E11.9 100 each 0   lisinopril (ZESTRIL) 20 MG tablet TAKE 1/2 TABLET DAILY 45 tablet 3   metFORMIN (GLUCOPHAGE-XR) 500 MG 24 hr tablet TAKE 2 TABLETS TWO TIMES A DAY 360 tablet 1   ONETOUCH DELICA LANCETS MISC by Does not apply route. Check blood sugar once daily     rosuvastatin (CRESTOR) 20 MG tablet TAKE 1 TABLET DAILY 90 tablet 1   Semaglutide (RYBELSUS) 14 MG TABS Take 1 tablet (14 mg total) by mouth daily. 90 tablet 3   varenicline (CHANTIX) 0.5 MG tablet Take 1 tablet by mouth 2 times daily. 30 tablet 2   No current facility-administered medications on file prior to visit.   Allergies  Allergen Reactions   Pioglitazone     REACTION: edema   Family History  Problem Relation Age of Onset   Heart attack Father 59   Diabetes Mother    Melanoma Sister    Diabetes Maternal Grandmother    Stomach cancer Maternal Grandmother    Diabetes Maternal Grandfather    Stroke Neg Hx    Colon cancer Neg Hx    Esophageal cancer Neg Hx    Rectal cancer Neg Hx    Colon polyps Neg Hx    PE: BP 138/80 (BP Location: Left Arm, Patient Position: Sitting, Cuff Size: Normal)   Pulse 61   Ht '5\' 6"'$  (1.676 m)   Wt 210 lb 3.2 oz (95.3 kg)   SpO2 97%   BMI 33.93 kg/m  Wt Readings from Last 3 Encounters:  06/10/22 210 lb 3.2 oz (95.3 kg)  01/29/22 206 lb (93.4 kg)  10/24/21 212 lb 9.6 oz (96.4 kg)   Constitutional: overweight, in NAD Eyes: no exophthalmos ENT: no thyromegaly, no cervical lymphadenopathy Cardiovascular: RRR, No MRG Respiratory: CTA B Musculoskeletal: no deformities Skin:  no  rashes Neurological: no tremor with outstretched hands  ASSESSMENT: 1. DM2, non-insulin-dependent, uncontrolled, with complications - CKD  2. HL  PLAN:  1. Patient with longstanding, uncontrolled, type 2 diabetes, diabetic regimen with metformin, sulfonylurea, GLP-1 receptor agonist and SGLT2 inhibitor, with improved control at last visit, when HbA1c returned 7.1%,  decreased from 8.5%.  At that time, he was not checking blood sugars at all.  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  I advised him to check every time he starts driving and then every hour for longer drives and not to proceed with driving unless sugars are higher than 100.  If not driving, I advised him to check at least once a day, rotating check times.  At last visit, since he was on Rybelsus, I advised him to stop Januvia.  He is on the lower dose of metformin due to GI intolerance to the 1000 mg twice a day dose. -At today's visit, sugars remain above target in the morning and they go up to 200, depending on what he eats the night before.  He is wondering whether adding back Januvia will help but we discussed that with Rybelsus on board, Januvia is not indicated.  However, my recommendation was to switch from Rybelsus to [place].  I had in my records that he had nausea with Ozempic before, but he mentions that he actually felt well on Ozempic and even lost weight.  He is interested in restarting it.  He does mention that he has tried Trulicity before but he did not have good luck with the pens as he found these difficult to use. -For now, we can continue metformin, glimepiride, and Farxiga, at the current doses -He is still not checking blood sugars (latest CBGs are from 1 to 2 months ago) and I strongly advised him to start.  I again advised him that if he is not checking consistently, we cannot let him drive. - I suggested to:  Patient  Instructions  Please continue: - Metformin ER 500 mg 2x a day - Glimepiride 0.5 mg before breakfast - Farxiga 10 mg before breakfast  Please switch from Rybelsus to Ozempic 0.5 mg weekly.  START CHECKING BLOOD SUGARS AS DISCUSSED!  Please return in 4 months with your sugar log.   - we checked his HbA1c: 8% (higher) - advised to check sugars at different times of the day - 1x a day, rotating check times - advised for yearly eye exams >> he is UTD - return to clinic in 3-4 months  2. HL -Reviewed latest lipid panel from 10/2021: LDL above our goal of less than 70, the rest of the fractions at goal: Lab Results  Component Value Date   CHOL 159 10/24/2021   HDL 39.30 10/24/2021   LDLCALC 90 10/24/2021   LDLDIRECT 129.0 04/29/2017   TRIG 148.0 10/24/2021   CHOLHDL 4 10/24/2021  -Continue home Crestor 20 mg daily and fibrate 160 mg daily without side effects  Philemon Kingdom, MD PhD [facility] Endocrinology

## 2022-06-10 NOTE — Patient Instructions (Addendum)
Please continue: - Metformin ER 500 mg 2x a day - Glimepiride 0.5 mg before breakfast - Farxiga 10 mg before breakfast  Please switch from Rybelsus to Ozempic 0.5 mg weekly.  START CHECKING BLOOD SUGARS AS DISCUSSED!  Please return in 4 months with your sugar log.

## 2022-06-18 DIAGNOSIS — E785 Hyperlipidemia, unspecified: Secondary | ICD-10-CM | POA: Diagnosis not present

## 2022-06-18 DIAGNOSIS — D72829 Elevated white blood cell count, unspecified: Secondary | ICD-10-CM | POA: Diagnosis not present

## 2022-06-18 DIAGNOSIS — E1165 Type 2 diabetes mellitus with hyperglycemia: Secondary | ICD-10-CM | POA: Diagnosis not present

## 2022-06-18 DIAGNOSIS — Z20822 Contact with and (suspected) exposure to covid-19: Secondary | ICD-10-CM | POA: Diagnosis not present

## 2022-06-18 DIAGNOSIS — F17210 Nicotine dependence, cigarettes, uncomplicated: Secondary | ICD-10-CM | POA: Diagnosis not present

## 2022-06-18 DIAGNOSIS — R809 Proteinuria, unspecified: Secondary | ICD-10-CM | POA: Diagnosis not present

## 2022-06-18 DIAGNOSIS — K353 Acute appendicitis with localized peritonitis, without perforation or gangrene: Secondary | ICD-10-CM | POA: Diagnosis not present

## 2022-06-18 DIAGNOSIS — E119 Type 2 diabetes mellitus without complications: Secondary | ICD-10-CM | POA: Diagnosis not present

## 2022-06-18 DIAGNOSIS — K3533 Acute appendicitis with perforation and localized peritonitis, with abscess: Secondary | ICD-10-CM | POA: Diagnosis not present

## 2022-06-18 DIAGNOSIS — K3580 Unspecified acute appendicitis: Secondary | ICD-10-CM | POA: Diagnosis not present

## 2022-06-18 DIAGNOSIS — Z7984 Long term (current) use of oral hypoglycemic drugs: Secondary | ICD-10-CM | POA: Diagnosis not present

## 2022-06-18 DIAGNOSIS — R109 Unspecified abdominal pain: Secondary | ICD-10-CM | POA: Diagnosis not present

## 2022-06-18 DIAGNOSIS — I10 Essential (primary) hypertension: Secondary | ICD-10-CM | POA: Diagnosis not present

## 2022-06-18 DIAGNOSIS — K8020 Calculus of gallbladder without cholecystitis without obstruction: Secondary | ICD-10-CM | POA: Diagnosis not present

## 2022-06-18 DIAGNOSIS — Z79899 Other long term (current) drug therapy: Secondary | ICD-10-CM | POA: Diagnosis not present

## 2022-06-18 DIAGNOSIS — G4730 Sleep apnea, unspecified: Secondary | ICD-10-CM | POA: Diagnosis not present

## 2022-06-18 DIAGNOSIS — N3289 Other specified disorders of bladder: Secondary | ICD-10-CM | POA: Diagnosis not present

## 2022-06-18 DIAGNOSIS — I700 Atherosclerosis of aorta: Secondary | ICD-10-CM | POA: Diagnosis not present

## 2022-06-19 DIAGNOSIS — Z7984 Long term (current) use of oral hypoglycemic drugs: Secondary | ICD-10-CM | POA: Diagnosis not present

## 2022-06-19 DIAGNOSIS — F17210 Nicotine dependence, cigarettes, uncomplicated: Secondary | ICD-10-CM | POA: Diagnosis not present

## 2022-06-19 DIAGNOSIS — K353 Acute appendicitis with localized peritonitis, without perforation or gangrene: Secondary | ICD-10-CM | POA: Diagnosis not present

## 2022-06-19 DIAGNOSIS — E785 Hyperlipidemia, unspecified: Secondary | ICD-10-CM | POA: Diagnosis not present

## 2022-06-19 DIAGNOSIS — K8020 Calculus of gallbladder without cholecystitis without obstruction: Secondary | ICD-10-CM | POA: Diagnosis not present

## 2022-06-19 DIAGNOSIS — I10 Essential (primary) hypertension: Secondary | ICD-10-CM | POA: Diagnosis not present

## 2022-06-19 DIAGNOSIS — I700 Atherosclerosis of aorta: Secondary | ICD-10-CM | POA: Diagnosis not present

## 2022-06-19 DIAGNOSIS — E1165 Type 2 diabetes mellitus with hyperglycemia: Secondary | ICD-10-CM | POA: Diagnosis not present

## 2022-06-19 DIAGNOSIS — Z79899 Other long term (current) drug therapy: Secondary | ICD-10-CM | POA: Diagnosis not present

## 2022-06-19 DIAGNOSIS — G4730 Sleep apnea, unspecified: Secondary | ICD-10-CM | POA: Diagnosis not present

## 2022-06-19 DIAGNOSIS — N3289 Other specified disorders of bladder: Secondary | ICD-10-CM | POA: Diagnosis not present

## 2022-06-26 ENCOUNTER — Other Ambulatory Visit: Payer: Self-pay | Admitting: Family Medicine

## 2022-06-26 DIAGNOSIS — E1165 Type 2 diabetes mellitus with hyperglycemia: Secondary | ICD-10-CM

## 2022-06-26 DIAGNOSIS — E1169 Type 2 diabetes mellitus with other specified complication: Secondary | ICD-10-CM

## 2022-08-01 ENCOUNTER — Other Ambulatory Visit: Payer: Self-pay | Admitting: Family Medicine

## 2022-08-13 ENCOUNTER — Telehealth: Payer: Self-pay

## 2022-08-13 NOTE — Telephone Encounter (Signed)
Pt called to request a form be completed and faxed to his insurance. Pt will be faxing form into the office.

## 2022-08-28 NOTE — Telephone Encounter (Signed)
Forms given to provider to sign.

## 2022-09-11 ENCOUNTER — Ambulatory Visit (INDEPENDENT_AMBULATORY_CARE_PROVIDER_SITE_OTHER): Payer: BC Managed Care – PPO | Admitting: Family Medicine

## 2022-09-11 ENCOUNTER — Encounter: Payer: Self-pay | Admitting: Family Medicine

## 2022-09-11 VITALS — BP 124/70 | HR 58 | Temp 97.9°F | Resp 17 | Ht 66.0 in | Wt 207.0 lb

## 2022-09-11 DIAGNOSIS — Z0000 Encounter for general adult medical examination without abnormal findings: Secondary | ICD-10-CM

## 2022-09-11 DIAGNOSIS — E1169 Type 2 diabetes mellitus with other specified complication: Secondary | ICD-10-CM

## 2022-09-11 DIAGNOSIS — Z125 Encounter for screening for malignant neoplasm of prostate: Secondary | ICD-10-CM | POA: Diagnosis not present

## 2022-09-11 DIAGNOSIS — E785 Hyperlipidemia, unspecified: Secondary | ICD-10-CM

## 2022-09-11 LAB — CBC WITH DIFFERENTIAL/PLATELET
Basophils Absolute: 0 10*3/uL (ref 0.0–0.1)
Basophils Relative: 0.6 % (ref 0.0–3.0)
Eosinophils Absolute: 0.2 10*3/uL (ref 0.0–0.7)
Eosinophils Relative: 2.8 % (ref 0.0–5.0)
HCT: 50.7 % (ref 39.0–52.0)
Hemoglobin: 17.3 g/dL — ABNORMAL HIGH (ref 13.0–17.0)
Lymphocytes Relative: 24.6 % (ref 12.0–46.0)
Lymphs Abs: 1.9 10*3/uL (ref 0.7–4.0)
MCHC: 34.1 g/dL (ref 30.0–36.0)
MCV: 87.8 fl (ref 78.0–100.0)
Monocytes Absolute: 0.7 10*3/uL (ref 0.1–1.0)
Monocytes Relative: 8.7 % (ref 3.0–12.0)
Neutro Abs: 4.8 10*3/uL (ref 1.4–7.7)
Neutrophils Relative %: 63.3 % (ref 43.0–77.0)
Platelets: 223 10*3/uL (ref 150.0–400.0)
RBC: 5.78 Mil/uL (ref 4.22–5.81)
RDW: 13.3 % (ref 11.5–15.5)
WBC: 7.5 10*3/uL (ref 4.0–10.5)

## 2022-09-11 LAB — BASIC METABOLIC PANEL
BUN: 21 mg/dL (ref 6–23)
CO2: 29 mEq/L (ref 19–32)
Calcium: 10.1 mg/dL (ref 8.4–10.5)
Chloride: 100 mEq/L (ref 96–112)
Creatinine, Ser: 1.2 mg/dL (ref 0.40–1.50)
GFR: 64.76 mL/min (ref >60.00)
Glucose, Bld: 112 mg/dL — ABNORMAL HIGH (ref 70–99)
Potassium: 5.5 mEq/L — ABNORMAL HIGH (ref 3.5–5.1)
Sodium: 137 mEq/L (ref 135–145)

## 2022-09-11 LAB — LIPID PANEL
Cholesterol: 123 mg/dL (ref 0–200)
HDL: 36.4 mg/dL — ABNORMAL LOW (ref >39.00)
LDL Cholesterol: 65 mg/dL (ref 0–99)
NonHDL: 86.28
Total CHOL/HDL Ratio: 3
Triglycerides: 106 mg/dL (ref 0.0–149.0)
VLDL: 21.2 mg/dL (ref 0.0–40.0)

## 2022-09-11 LAB — HEPATIC FUNCTION PANEL
ALT: 16 U/L (ref 0–53)
AST: 16 U/L (ref 0–37)
Albumin: 4.5 g/dL (ref 3.5–5.2)
Alkaline Phosphatase: 48 U/L (ref 39–117)
Bilirubin, Direct: 0.1 mg/dL (ref 0.0–0.3)
Total Bilirubin: 0.5 mg/dL (ref 0.2–1.2)
Total Protein: 7 g/dL (ref 6.0–8.3)

## 2022-09-11 LAB — HM DIABETES EYE EXAM

## 2022-09-11 LAB — TSH: TSH: 2.01 u[IU]/mL (ref 0.35–5.50)

## 2022-09-11 LAB — HEMOGLOBIN A1C: Hgb A1c MFr Bld: 8 % — ABNORMAL HIGH (ref 4.6–6.5)

## 2022-09-11 LAB — PSA: PSA: 2.39 ng/mL (ref 0.10–4.00)

## 2022-09-11 NOTE — Patient Instructions (Addendum)
Follow up in 6 months to recheck blood pressure and cholesterol We'll notify you of your lab results and make any changes if needed Continue to work on healthy diet and regular exercise- you can do it!! STOP SMOKING!!! Call with any questions or concerns Stay Safe!  Stay Healthy! Happy Spring!!!

## 2022-09-11 NOTE — Assessment & Plan Note (Signed)
Pt's PE WNL w/ exception of BMI and known sun damage.  UTD on colonoscopy, foot exam, microalbumin, eye exam, shingles, Tdap.  Check labs.  Anticipatory guidance provided.

## 2022-09-11 NOTE — Progress Notes (Signed)
   Subjective:    Patient ID: Zachary Clay., male    DOB: [birth year], 62 y.o.   MRN: [id number]  HPI CPE- UTD on colonoscopy, foot exam, microalbumin, eye exam.  Patient Care Team    Relationship Specialty Notifications Start End  Midge Minium, MD PCP - General Family Medicine  05/04/14   Marshell Garfinkel, MD Consulting Physician Pulmonary Disease  08/01/15   Renato Shin, MD (Inactive) Consulting Physician Endocrinology  02/14/20      Health Maintenance  Topic Date Due   INFLUENZA VACCINE  09/22/2022 (Originally 01/22/2022)   Diabetic kidney evaluation - eGFR measurement  10/25/2022   Diabetic kidney evaluation - Urine ACR  10/25/2022   OPHTHALMOLOGY EXAM  10/31/2022   HEMOGLOBIN A1C  12/10/2022   FOOT EXAM  01/30/2023   DTaP/Tdap/Td (3 - Td or Tdap) 12/17/2027   COLONOSCOPY (Pts 45-44yrs Insurance coverage will need to be confirmed)  04/09/2028   Zoster Vaccines- Shingrix  Completed   HPV VACCINES  Aged Out   COVID-19 Vaccine  Discontinued   Hepatitis C Screening  Discontinued   HIV Screening  Discontinued      Review of Systems Patient reports no vision changes, anorexia, fever ,adenopathy, persistant/recurrent hoarseness, swallowing issues, chest pain, palpitations, edema, persistant/recurrent cough, hemoptysis, dyspnea (rest,exertional, paroxysmal nocturnal), gastrointestinal  bleeding (melena, rectal bleeding), abdominal pain, excessive heart burn, GU symptoms (dysuria, hematuria, voiding/incontinence issues) syncope, focal weakness, memory loss, numbness & tingling, skin/hair/nail changes, depression, anxiety, abnormal bruising/bleeding, musculoskeletal symptoms/signs.   + decreased hearing    Objective:   Physical Exam General Appearance:    Alert, cooperative, no distress, appears stated age, obese  Head:    Normocephalic, without obvious abnormality, atraumatic  Eyes:    PERRL, conjunctiva/corneas clear, EOM's intact both eyes       Ears:    Normal TM's and  external ear canals, both ears  Nose:   Nares normal, septum midline, mucosa normal, no drainage   or sinus tenderness  Throat:   Lips, mucosa, and tongue normal; teeth and gums normal  Neck:   Supple, symmetrical, trachea midline, no adenopathy;       thyroid:  No enlargement/tenderness/nodules  Back:     Symmetric, no curvature, ROM normal, no CVA tenderness  Lungs:     Clear to auscultation bilaterally, respirations unlabored  Chest wall:    No tenderness or deformity  Heart:    Regular rate and rhythm, S1 and S2 normal, no murmur, rub   or gallop  Abdomen:     Soft, non-tender, bowel sounds active all four quadrants,    no masses, no organomegaly  Genitalia:    deferred  Rectal:    Extremities:   Extremities normal, atraumatic, no cyanosis or edema  Pulses:   2+ and symmetric all extremities  Skin:   Extensive sun damage, turgor normal, no rashes or lesions  Lymph nodes:   Cervical, supraclavicular, and axillary nodes normal  Neurologic:   CNII-XII intact. Normal strength, sensation and reflexes      throughout          Assessment & Plan:

## 2022-09-11 NOTE — Assessment & Plan Note (Signed)
Chronic problem.  Check labs.  Adjust meds prn  

## 2022-09-12 ENCOUNTER — Other Ambulatory Visit: Payer: Self-pay

## 2022-09-12 ENCOUNTER — Telehealth: Payer: Self-pay

## 2022-09-12 DIAGNOSIS — E875 Hyperkalemia: Secondary | ICD-10-CM

## 2022-09-12 NOTE — Telephone Encounter (Signed)
Informed pt of lab results . BMP order is in place . Pt is going to another [facility] to have it drew .

## 2022-09-12 NOTE — Telephone Encounter (Signed)
-----   Message from Midge Minium, MD sent at 09/12/2022  7:35 AM EDT ----- A1C remains stable at 8.  I am forwarding your labs to Dr Cruzita Lederer to review and determine if any med changes are appropriate  Your potassium is mildly elevated.  Please increase your water intake and we'll repeat your BMP in 1 week to ensure this is back in normal range (dx hyperkalemia)  Remainder of labs look good!

## 2022-09-18 ENCOUNTER — Other Ambulatory Visit (INDEPENDENT_AMBULATORY_CARE_PROVIDER_SITE_OTHER): Payer: BC Managed Care – PPO

## 2022-09-18 ENCOUNTER — Telehealth: Payer: Self-pay

## 2022-09-18 DIAGNOSIS — E875 Hyperkalemia: Secondary | ICD-10-CM | POA: Diagnosis not present

## 2022-09-18 LAB — BASIC METABOLIC PANEL
BUN: 20 mg/dL (ref 6–23)
CO2: 31 mEq/L (ref 19–32)
Calcium: 10.4 mg/dL (ref 8.4–10.5)
Chloride: 102 mEq/L (ref 96–112)
Creatinine, Ser: 1.35 mg/dL (ref 0.40–1.50)
GFR: 56.22 mL/min — ABNORMAL LOW (ref >60.00)
Glucose, Bld: 169 mg/dL — ABNORMAL HIGH (ref 70–99)
Potassium: 5.5 mEq/L — ABNORMAL HIGH (ref 3.5–5.1)
Sodium: 142 mEq/L (ref 135–145)

## 2022-09-18 NOTE — Progress Notes (Signed)
Pt here for lab appt. Gold tube used to collect specimen. Pt tolerated well.

## 2022-09-25 ENCOUNTER — Telehealth: Payer: Self-pay

## 2022-09-25 NOTE — Telephone Encounter (Signed)
Informed pt of lab results  

## 2022-09-25 NOTE — Telephone Encounter (Signed)
-----   Message from Midge Minium, MD sent at 09/25/2022  7:39 AM EDT ----- Your potassium is stable at 5.5 but your Creatinine (kidney function) has increased to 1.35 which causes the GFR to decrease.  Please make sure you are drinking plenty of water.  No other changes at this time

## 2022-09-29 ENCOUNTER — Other Ambulatory Visit: Payer: Self-pay | Admitting: Family Medicine

## 2022-09-29 DIAGNOSIS — E1165 Type 2 diabetes mellitus with hyperglycemia: Secondary | ICD-10-CM

## 2022-09-29 DIAGNOSIS — E1169 Type 2 diabetes mellitus with other specified complication: Secondary | ICD-10-CM

## 2022-10-06 NOTE — Telephone Encounter (Signed)
error 

## 2022-10-10 ENCOUNTER — Encounter: Payer: Self-pay | Admitting: Internal Medicine

## 2022-10-10 ENCOUNTER — Ambulatory Visit: Payer: BC Managed Care – PPO | Admitting: Internal Medicine

## 2022-10-10 DIAGNOSIS — E785 Hyperlipidemia, unspecified: Secondary | ICD-10-CM

## 2022-10-10 DIAGNOSIS — E1165 Type 2 diabetes mellitus with hyperglycemia: Secondary | ICD-10-CM

## 2022-10-10 DIAGNOSIS — E1122 Type 2 diabetes mellitus with diabetic chronic kidney disease: Secondary | ICD-10-CM | POA: Diagnosis not present

## 2022-10-10 DIAGNOSIS — E1169 Type 2 diabetes mellitus with other specified complication: Secondary | ICD-10-CM | POA: Diagnosis not present

## 2022-10-10 MED ORDER — DAPAGLIFLOZIN PROPANEDIOL 10 MG PO TABS: 10.0000 mg | ORAL_TABLET | Freq: Every day | ORAL | 3 refills | Status: DC

## 2022-10-10 MED ORDER — METFORMIN HCL ER 500 MG PO TB24: 500.0000 mg | ORAL_TABLET | Freq: Two times a day (BID) | ORAL | 3 refills | Status: DC

## 2022-10-10 MED ORDER — SEMAGLUTIDE (1 MG/DOSE) 4 MG/3ML ~~LOC~~ SOPN: 1.0000 mg | PEN_INJECTOR | SUBCUTANEOUS | 3 refills | Status: DC

## 2022-10-10 MED ORDER — GLIMEPIRIDE 1 MG PO TABS
0.5000 mg | ORAL_TABLET | Freq: Every day | ORAL | 3 refills | Status: DC
Start: 2022-10-10 — End: 2023-09-22

## 2022-10-10 NOTE — Patient Instructions (Addendum)
Stop Rybelsus.  Check blood sugars 1x a day and let me know about the values in 2 weeks.  Please continue: - Metformin ER 500 mg 2x a day - Glimepiride 0.5 mg before breakfast - Farxiga 10 mg before breakfast  Increase: - Ozempic 1 mg weekly  Please return in 3 months.

## 2022-10-10 NOTE — Progress Notes (Signed)
Patient ID: Zachary Clay., male   DOB: 01-30-1961, 62 y.o.   MRN: [id number]  HPI: Zachary Clay. is a 62 y.o.-year-old male, returning for follow-up for DM2, dx in 1998, non-insulin-dependent, uncontrolled, with complications (CKD). Pt. previously saw Dr. Everardo All, but last visit with me 4 months ago.  Interim history: No increased urination, blurry vision, nausea, chest pain. He has mild constipation.  Reviewed HbA1c: Lab Results  Component Value Date   HGBA1C 8.0 (H) 09/11/2022   HGBA1C 8.0 (A) 06/10/2022   HGBA1C 7.1 (A) 01/29/2022   HGBA1C 8.1 (A) 10/23/2021   HGBA1C 8.0 (A) 08/15/2021   HGBA1C 8.0 (A) 05/14/2021   HGBA1C 7.9 (H) 04/25/2021   HGBA1C 8.7 (A) 09/27/2020   HGBA1C 9.5 (H) 08/31/2020   HGBA1C 7.4 (A) 12/23/2019   Pt is on a regimen of: - Metformin ER 1000 >> 500 mg 2x a day - a little nausea - Glimepiride 0.5 mg before breakfast (decreased 10/2021) - Rybelsus 14 mg before breakfast (increased 10/2021) >> Ozempic 0.5 mg weekly but taking both now! 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 He lost weight on it. He tried Trulicity >> pbs with the injector. He tried Actos but developed edema.  At last visit, he was not checking his blood sugars.He is still not checking... - am: n/c >> 128-200 (ave 150) >> 150 - 2h after b'fast: n/c - before lunch: n/c - 2h after lunch: n/c - before dinner: n/c - 2h after dinner: n/c - bedtime: n/c - nighttime: n/c Lowest sugar was 120 >> 128 >> ? Highest sugar was 210 >> 200 >> ?  Glucometer: One Touch ultra  -+ Mild CKD, last BUN/creatinine:  Lab Results  Component Value Date   BUN 20 09/18/2022   BUN 21 09/11/2022   CREATININE 1.35 09/18/2022   CREATININE 1.20 09/11/2022  On lisinopril 10 mg daily, Farxiga 10 mg daily.  -+ HL; last set of lipids: Lab Results  Component Value Date   CHOL 123  09/11/2022   HDL 36.40 (L) 09/11/2022   LDLCALC 65 09/11/2022   LDLDIRECT 129.0 04/29/2017   TRIG 106.0 09/11/2022   CHOLHDL 3 09/11/2022  On Crestor 10 mg daily and fenofibrate 160 mg daily.  - last eye exam was in 11/2021. No DR reportedly. + cataracts. Blepharoptosis.  - no numbness and tingling in his feet.  Last foot exam was on 01/24/2022.  ROS: + see HPI  Past Medical History:  Diagnosis Date   Diabetes mellitus    Hyperlipidemia    Personal history of colonic adenomas 02/16/2013   Polycythemia 2012   Hgb 17.8   Past Surgical History:  Procedure Laterality Date   APPENDECTOMY     COLONOSCOPY  02/08/2013   ESOPHAGOGASTRODUODENOSCOPY  2003   [facility]   WISDOM TOOTH EXTRACTION     Social History   Socioeconomic History   Marital status: Married    Spouse name: Not on file   Number of children: Not on file   Years of education: Not on file   Highest education level: Not on file  Occupational History    Employer: [employer]  Tobacco Use   Smoking status: Every Day    Packs/day: 3    Types: Cigarettes   Smokeless tobacco: Never   Tobacco comments:    smoked 1977- present, up to 3  ppd  Vaping Use   Vaping Use: Never used  Substance and Sexual Activity   Alcohol use: Yes    Alcohol/week: 14.0 standard drinks of alcohol    Types: 14 Cans of beer per week    Comment: Beer   Drug use: No   Sexual activity: Not on file  Other Topics Concern   Not on file  Social History Narrative   Not on file   Social Determinants of Health   Financial Resource Strain: Not on file  Food Insecurity: Not on file  Transportation Needs: Not on file  Physical Activity: Not on file  Stress: Not on file  Social Connections: Not on file  Intimate Partner Violence: Not on file   Current Outpatient Medications on File Prior to Visit  Medication Sig Dispense Refill   clotrimazole-betamethasone (LOTRISONE) cream APPLY 1 APPLICATION 2 (TWO) TIMES DAILY TOPICALLY. 135 g 1    esomeprazole (NEXIUM) 40 MG capsule TAKE 1 CAPSULE DAILY 90 capsule 1   FARXIGA 10 MG TABS tablet TAKE 1 TABLET DAILY 90 tablet 1   fenofibrate 160 MG tablet TAKE 1 TABLET DAILY 90 tablet 1   glimepiride (AMARYL) 1 MG tablet Take 0.5 tablets (0.5 mg total) by mouth daily with breakfast. 45 tablet 1   glucose blood (ONE TOUCH ULTRA TEST) test strip 1 each by Other route daily. E11.9 100 each 0   lisinopril (ZESTRIL) 20 MG tablet TAKE 1/2 TABLET DAILY 45 tablet 1   metFORMIN (GLUCOPHAGE-XR) 500 MG 24 hr tablet TAKE 2 TABLETS TWICE A DAY 360 tablet 1   ONETOUCH DELICA LANCETS MISC by Does not apply route. Check blood sugar once daily     rosuvastatin (CRESTOR) 20 MG tablet TAKE 1 TABLET DAILY 90 tablet 1   Semaglutide,0.25 or 0.5MG /DOS, 2 MG/3ML SOPN Inject 0.5 mg into the skin once a week. 9 mL 3   No current facility-administered medications on file prior to visit.   Allergies  Allergen Reactions   Pioglitazone     REACTION: edema   Family History  Problem Relation Age of Onset   Heart attack Father 37   Diabetes Mother    Melanoma Sister    Diabetes Maternal Grandmother    Stomach cancer Maternal Grandmother    Diabetes Maternal Grandfather    Stroke Neg Hx    Colon cancer Neg Hx    Esophageal cancer Neg Hx    Rectal cancer Neg Hx    Colon polyps Neg Hx    PE: BP 136/82 (BP Location: Right Arm, Patient Position: Sitting, Cuff Size: Normal)   Pulse (!) 53   Ht  (1.676 m)   Wt 205 lb 6.4 oz (93.2 kg)   SpO2 99%   BMI 33.15 kg/m  Wt Readings from Last 3 Encounters:  10/10/22 205 lb 6.4 oz (93.2 kg)  09/11/22 207 lb (93.9 kg)  06/10/22 210 lb 3.2 oz (95.3 kg)   Constitutional: overweight, in NAD Eyes: no exophthalmos ENT: no thyromegaly, no cervical lymphadenopathy Cardiovascular: RRR, No MRG Respiratory: CTA B Musculoskeletal: no deformities Skin:  + Eczema rash on dorsum of hands Neurological: no tremor with outstretched hands  ASSESSMENT: 1. DM2,  non-insulin-dependent, uncontrolled, with complications - CKD  2. HL  PLAN:  1. Patient with longstanding, uncontrolled, type 2 diabetes, on oral antidiabetic regimen with metformin, sulfonylurea, SGLT2 inhibitor and also weekly GLP-1 receptor agonist, changed from p.o. formulation at last visit.  At that time, HbA1c was higher, at 8.0%, increased from 7.1%  previously.  Sugars were above target in the morning up to 200s, depending on whether he was eating the night before or not.  My recommendation was to switch from Rybelsus to Ozempic.  He was mentioning that he tried Trulicity in the past and he had a hard time using the pens.  He was not checking blood sugars and I strongly advised him to start.  At that time, I again advised him that if he was not checking sugars consistently, his driving license may be at risk. -He had another HbA1c obtained last month and this was still 8%, stable -At today's visit, he tells me that he is taking both Rybelsus and Ozempic, as he misunderstood the instructions given at last visit.  Will go ahead and stop Rybelsus.  Since his HbA1c is still high and his sugars in the morning whenever checked (which is very rare) are still above target, we will go ahead and increase the Ozempic dose.  Will continue the rest of the regimen. -He is still not checking blood sugars.  We again discussed that we absolutely need him to check once a day to continue to keep his driving license.  I advised him to do so and send me the blood sugars in approximately 1 to 2 weeks. - I suggested to:  Patient Instructions  Stop Rybelsus.  Check blood sugars 1x a day and let me know about the values in 2 weeks.  Please continue: - Metformin ER 500 mg 2x a day - Glimepiride 0.5 mg before breakfast - Farxiga 10 mg before breakfast  Increase: - Ozempic 1 mg weekly  Please return in 3 months.   - STRONGLY advised to check sugars at different times of the day - 1x a day, rotating check  times - advised for yearly eye exams >> he is UTD - return to clinic in 3 months  2. HL -Reviewed latest lipid panel from 08/2022: LDL at goal, HDL slightly low, triglycerides normal: Lab Results  Component Value Date   CHOL 123 09/11/2022   HDL 36.40 (L) 09/11/2022   LDLCALC 65 09/11/2022   LDLDIRECT 129.0 04/29/2017   TRIG 106.0 09/11/2022   CHOLHDL 3 09/11/2022  -He continues Crestor 20 mg daily and fenofibrate 160 mg daily-no side effects  Carlus Pavlov, MD PhD [facility] Endocrinology

## 2022-10-13 ENCOUNTER — Other Ambulatory Visit: Payer: Self-pay | Admitting: Family Medicine

## 2022-12-19 DIAGNOSIS — G4733 Obstructive sleep apnea (adult) (pediatric): Secondary | ICD-10-CM | POA: Diagnosis not present

## 2023-01-24 ENCOUNTER — Other Ambulatory Visit: Payer: Self-pay | Admitting: Family Medicine

## 2023-02-06 ENCOUNTER — Encounter (INDEPENDENT_AMBULATORY_CARE_PROVIDER_SITE_OTHER): Payer: Self-pay

## 2023-02-19 ENCOUNTER — Encounter: Payer: Self-pay | Admitting: Internal Medicine

## 2023-02-19 ENCOUNTER — Ambulatory Visit: Payer: BC Managed Care – PPO | Admitting: Internal Medicine

## 2023-02-19 VITALS — BP 120/70 | HR 69 | Ht 66.0 in | Wt 198.6 lb

## 2023-02-19 DIAGNOSIS — Z7984 Long term (current) use of oral hypoglycemic drugs: Secondary | ICD-10-CM

## 2023-02-19 DIAGNOSIS — N189 Chronic kidney disease, unspecified: Secondary | ICD-10-CM

## 2023-02-19 DIAGNOSIS — Z7985 Long-term (current) use of injectable non-insulin antidiabetic drugs: Secondary | ICD-10-CM | POA: Diagnosis not present

## 2023-02-19 DIAGNOSIS — E1122 Type 2 diabetes mellitus with diabetic chronic kidney disease: Secondary | ICD-10-CM

## 2023-02-19 LAB — POCT GLYCOSYLATED HEMOGLOBIN (HGB A1C): Hemoglobin A1C: 7.5 % — AB (ref 4.0–5.6)

## 2023-02-19 NOTE — Patient Instructions (Addendum)
Please continue: - Metformin ER 500 mg 2x a day - Glimepiride 0.5 mg before breakfast - Farxiga 10 mg before breakfast - Ozempic 1 mg weekly  Check some sugars later in the day.  Please return in 3 months.

## 2023-02-19 NOTE — Progress Notes (Signed)
Patient ID: Zachary Ensign., male   DOB: 1960-12-03, 62 y.o.   MRN: [id number]  HPI: Zachary Mast. is a 62 y.o.-year-old male, returning for follow-up for DM2, dx in 1998, non-insulin-dependent, uncontrolled, with complications (CKD). Pt. previously saw Dr. Everardo All, but last visit with me 4 months ago.  Interim history: No increased urination, blurry vision, nausea, chest pain.   Reviewed HbA1c: Lab Results  Component Value Date   HGBA1C 8.0 (H) 09/11/2022   HGBA1C 8.0 (A) 06/10/2022   HGBA1C 7.1 (A) 01/29/2022   HGBA1C 8.1 (A) 10/23/2021   HGBA1C 8.0 (A) 08/15/2021   HGBA1C 8.0 (A) 05/14/2021   HGBA1C 7.9 (H) 04/25/2021   HGBA1C 8.7 (A) 09/27/2020   HGBA1C 9.5 (H) 08/31/2020   HGBA1C 7.4 (A) 12/23/2019   Pt is on a regimen of: - Metformin ER 1000 >> 500 mg 2x a day - a little nausea - Glimepiride 0.5 mg before breakfast (decreased 10/2021) - Rybelsus 14 mg before breakfast (increased 10/2021) >> Ozempic 0.5 >> 1 mg weekly  -  >> stopped 01/2022 - Farxiga 10 mg before breakfast 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 He lost weight on it. He tried Trulicity >> pbs with the injector. He tried Actos but developed edema.  At last 2 visits, he was not taking his blood sugars. Now checking once a day. - am: n/c >> 128-200 (ave 150) >> 150 >> 110-120, 178 - 2h after b'fast: n/c - before lunch: n/c - 2h after lunch: n/c - before dinner: n/c - 2h after dinner: n/c - bedtime: n/c - nighttime: n/c Lowest sugar was 120 >> 128 >> 110 Highest sugar was 210 >> 200 >> 178  Glucometer: One Touch ultra  -+ Mild CKD, last BUN/creatinine:  Lab Results  Component Value Date   BUN 20 09/18/2022   BUN 21 09/11/2022   CREATININE 1.35 09/18/2022   CREATININE 1.20 09/11/2022   Lab Results  Component Value Date   MICRALBCREAT 57.1 (H) 10/24/2021   MICRALBCREAT 35.9 (H) 05/07/2017   MICRALBCREAT 30.2 (H) 02/10/2014    MICRALBCREAT 6.8 12/31/2012   MICRALBCREAT 4.5 10/31/2011   MICRALBCREAT 5.6 02/01/2011   MICRALBCREAT 6.3 11/01/2010   MICRALBCREAT 54.8 (H) 05/29/2009   MICRALBCREAT 14.7 01/05/2007  On lisinopril 10 mg daily, Farxiga 10 mg daily.  -+ HL; last set of lipids: Lab Results  Component Value Date   CHOL 123 09/11/2022   HDL 36.40 (L) 09/11/2022   LDLCALC 65 09/11/2022   LDLDIRECT 129.0 04/29/2017   TRIG 106.0 09/11/2022   CHOLHDL 3 09/11/2022  On Crestor 10 mg daily and fenofibrate 160 mg daily.  - last eye exam was 09/11/2022: No DR reportedly. + cataracts. Blepharoptosis.  - no numbness and tingling in his feet.  Last foot exam was on 01/24/2022.  ROS: + see HPI  Past Medical History:  Diagnosis Date   Diabetes mellitus    Hyperlipidemia    Personal history of colonic adenomas 02/16/2013   Polycythemia 2012   Hgb 17.8   Past Surgical History:  Procedure Laterality Date   APPENDECTOMY     COLONOSCOPY  02/08/2013   ESOPHAGOGASTRODUODENOSCOPY  2003   [facility]   WISDOM TOOTH EXTRACTION     Social History   Socioeconomic History   Marital status: Married    Spouse name: Not on file   Number of children: Not on file   Years of education: Not  on file   Highest education level: Not on file  Occupational History    Employer: [employer]  Tobacco Use   Smoking status: Every Day    Current packs/day: 3.00    Types: Cigarettes   Smokeless tobacco: Never   Tobacco comments:    smoked 1977- present, up to 3 ppd  Vaping Use   Vaping status: Never Used  Substance and Sexual Activity   Alcohol use: Yes    Alcohol/week: 14.0 standard drinks of alcohol    Types: 14 Cans of beer per week    Comment: Beer   Drug use: No   Sexual activity: Not on file  Other Topics Concern   Not on file  Social History Narrative   Not on file   Social Determinants of Health   Financial Resource Strain: Not on file  Food Insecurity: Low Risk  (06/18/2022)   [facility] prior to 08/24/2022., [facility] visits prior to 08/24/2022.   Food    Within the past 12 months, you worried that your food would run out before you got money to buy more food: Never true    Within the past 12 months, the food you bought just didn't last and you didn't have money to get more: Never true  Transportation Needs: No Transportation Needs (06/18/2022)   [facility]   Transportation    In the past 12 months, has lack of reliable transportation kept you from medical appointments, meetings, work or from getting things needed for daily living? : No  Physical Activity: Not on file  Stress: Not on file  Social Connections: Not on file  Intimate Partner Violence: Low Risk  (06/18/2022)   [facility] prior to 08/24/2022., [facility] visits prior to 08/24/2022.   Safety    How often does anyone, including family and friends, physically hurt you?: Never    How often does anyone, including family and friends, insult or talk down to you?: Never    How often does anyone, including family and friends, threaten you with harm?: Never    How often does anyone, including family and friends, scream or curse at you?: Never   Current Outpatient Medications on File Prior to Visit  Medication Sig Dispense Refill   clotrimazole-betamethasone (LOTRISONE) cream APPLY 1 APPLICATION 2 (TWO) TIMES DAILY TOPICALLY. 135 g 1   dapagliflozin propanediol (FARXIGA) 10 MG TABS tablet Take 1 tablet (10 mg total) by mouth daily. 90 tablet 3   esomeprazole (NEXIUM) 40 MG capsule TAKE 1 CAPSULE DAILY 90 capsule 1   fenofibrate 160 MG tablet TAKE 1 TABLET DAILY 90 tablet 1   glimepiride (AMARYL) 1 MG tablet Take 0.5 tablets (0.5 mg total) by mouth daily with breakfast. 45 tablet 3   glucose blood (ONE TOUCH ULTRA TEST) test strip 1 each by Other route daily. E11.9 100 each 0    lisinopril (ZESTRIL) 20 MG tablet TAKE 1/2 TABLET DAILY 45 tablet 1   metFORMIN (GLUCOPHAGE-XR) 500 MG 24 hr tablet Take 1 tablet (500 mg total) by mouth 2 (two) times daily. 180 tablet 3   ONETOUCH DELICA LANCETS MISC by Does not apply route. Check blood sugar once daily     rosuvastatin (CRESTOR) 20 MG tablet TAKE 1 TABLET DAILY 90 tablet 1   Semaglutide, 1 MG/DOSE, 4 MG/3ML SOPN Inject 1 mg as directed once a  week. 9 mL 3   No current facility-administered medications on file prior to visit.   Allergies  Allergen Reactions   Pioglitazone     REACTION: edema   Family History  Problem Relation Age of Onset   Heart attack Father 66   Diabetes Mother    Melanoma Sister    Diabetes Maternal Grandmother    Stomach cancer Maternal Grandmother    Diabetes Maternal Grandfather    Stroke Neg Hx    Colon cancer Neg Hx    Esophageal cancer Neg Hx    Rectal cancer Neg Hx    Colon polyps Neg Hx    PE: BP 120/70   Pulse 69   Ht 5\' 6"  (1.676 m)   Wt 198 lb 9.6 oz (90.1 kg)   SpO2 93%   BMI 32.05 kg/m  Wt Readings from Last 3 Encounters:  02/19/23 198 lb 9.6 oz (90.1 kg)  10/10/22 205 lb 6.4 oz (93.2 kg)  09/11/22 207 lb (93.9 kg)   Constitutional: overweight, in NAD Eyes: no exophthalmos ENT: no thyromegaly, no cervical lymphadenopathy Cardiovascular: RRR, No MRG Respiratory: CTA B Musculoskeletal: no deformities Skin:  + Eczema rash on dorsum of hands Neurological: no tremor with outstretched hands Diabetic Foot Exam - Simple   Simple Foot Form Diabetic Foot exam was performed with the following findings: Yes 02/19/2023  8:43 AM  Visual Inspection No deformities, no ulcerations, no other skin breakdown bilaterally: Yes Sensation Testing Intact to touch and monofilament testing bilaterally: Yes Pulse Check Posterior Tibialis and Dorsalis pulse intact bilaterally: Yes Comments    ASSESSMENT: 1. DM2, non-insulin-dependent, uncontrolled, with complications - CKD  2.  HL  PLAN:  1. Patient with longstanding, uncontrolled, type 2 diabetes, on oral antidiabetic regimen with metformin, sulfonylurea, and SGLT2 inhibitor and also weekly GLP-1 receptor agonist, increased at last visit.  At that time, HbA1c was stable, at 8.0%.  He was taking both Rybelsus and Ozempic, as he misunderstood instructions to switch from the first to the latter.  Restart Rybelsus and increase the Ozempic dose from 0.5 mg to 1 mg weekly.  He was not checking blood sugars and I strongly advised him to start checking, since otherwise, it may not have been possible for him to keep his driving license.  I advised him to send me the blood sugars in 1 to 2 weeks.  He did not do so.  However, he is not checking blood sugars in the morning.  I did advise him to try to rotate the blood sugar checks to include some sugars at least before and after dinner. -Sugars in the morning appear to be at goal, improved especially in the last month reportedly.  Especially in the setting of an improving HbA1c (see below), I did not suggest changes in his regimen.  He does not have GI side effects from the higher dose of Ozempic. -I advised him to: Patient Instructions  Please continue: - Metformin ER 500 mg 2x a day - Glimepiride 0.5 mg before breakfast - Farxiga 10 mg before breakfast - Ozempic 1 mg weekly  Check some sugars later in the day.  Please return in 3 months.   - we checked his HbA1c: 7.5% (lower)  - advised to check sugars at different times of the day - 1x a day, rotating check times - advised for yearly eye exams >> he is UTD - will check a microalbumin to creatinine ratio today - return to clinic in 3 months  2. HL -  Reviewed latest lipid panel from 08/2022: LDL at goal, HDL slightly low, Triglycerides normal: Lab Results  Component Value Date   CHOL 123 09/11/2022   HDL 36.40 (L) 09/11/2022   LDLCALC 65 09/11/2022   LDLDIRECT 129.0 04/29/2017   TRIG 106.0 09/11/2022   CHOLHDL 3  09/11/2022  -He is on Crestor 20 mg daily and fenofibrate 160 mg daily without side effects  Zachary Pavlov, MD PhD [facility] Endocrinology

## 2023-02-19 NOTE — Addendum Note (Signed)
Addended by: Pollie Meyer on: 02/19/2023 08:52 AM   Modules accepted: Orders

## 2023-03-14 DIAGNOSIS — G4733 Obstructive sleep apnea (adult) (pediatric): Secondary | ICD-10-CM | POA: Diagnosis not present

## 2023-03-17 ENCOUNTER — Telehealth: Payer: Self-pay | Admitting: Family Medicine

## 2023-03-17 ENCOUNTER — Ambulatory Visit: Payer: BC Managed Care – PPO | Admitting: Family Medicine

## 2023-03-17 NOTE — Telephone Encounter (Signed)
Absolutely!  I certainly understand and I wish him the best

## 2023-03-17 NOTE — Telephone Encounter (Signed)
Pt would like to do TOC dr tabori to [facility]. Pt stated he drives 1 hour to go to dr Beverely Low and this office is close to his home

## 2023-03-18 ENCOUNTER — Other Ambulatory Visit: Payer: Self-pay

## 2023-03-18 MED ORDER — FENOFIBRATE 160 MG PO TABS: 160.0000 mg | ORAL_TABLET | Freq: Every day | ORAL | 1 refills | Status: DC

## 2023-03-28 ENCOUNTER — Other Ambulatory Visit: Payer: Self-pay | Admitting: Family Medicine

## 2023-03-28 ENCOUNTER — Other Ambulatory Visit: Payer: Self-pay | Admitting: Internal Medicine

## 2023-03-28 DIAGNOSIS — E1122 Type 2 diabetes mellitus with diabetic chronic kidney disease: Secondary | ICD-10-CM

## 2023-03-28 DIAGNOSIS — E1165 Type 2 diabetes mellitus with hyperglycemia: Secondary | ICD-10-CM

## 2023-04-01 ENCOUNTER — Encounter: Payer: BC Managed Care – PPO | Admitting: Nurse Practitioner

## 2023-04-07 ENCOUNTER — Other Ambulatory Visit: Payer: Self-pay | Admitting: Internal Medicine

## 2023-04-07 DIAGNOSIS — E1122 Type 2 diabetes mellitus with diabetic chronic kidney disease: Secondary | ICD-10-CM

## 2023-04-17 ENCOUNTER — Other Ambulatory Visit: Payer: Self-pay | Admitting: Family Medicine

## 2023-04-17 DIAGNOSIS — E785 Hyperlipidemia, unspecified: Secondary | ICD-10-CM

## 2023-05-26 ENCOUNTER — Encounter: Payer: Self-pay | Admitting: Internal Medicine

## 2023-05-26 ENCOUNTER — Ambulatory Visit: Payer: BC Managed Care – PPO | Admitting: Internal Medicine

## 2023-05-26 VITALS — BP 122/70 | HR 67 | Ht 66.0 in | Wt 206.4 lb

## 2023-05-26 DIAGNOSIS — E1169 Type 2 diabetes mellitus with other specified complication: Secondary | ICD-10-CM

## 2023-05-26 DIAGNOSIS — E785 Hyperlipidemia, unspecified: Secondary | ICD-10-CM | POA: Diagnosis not present

## 2023-05-26 DIAGNOSIS — N189 Chronic kidney disease, unspecified: Secondary | ICD-10-CM

## 2023-05-26 DIAGNOSIS — Z7984 Long term (current) use of oral hypoglycemic drugs: Secondary | ICD-10-CM

## 2023-05-26 DIAGNOSIS — E1122 Type 2 diabetes mellitus with diabetic chronic kidney disease: Secondary | ICD-10-CM | POA: Diagnosis not present

## 2023-05-26 DIAGNOSIS — Z7985 Long-term (current) use of injectable non-insulin antidiabetic drugs: Secondary | ICD-10-CM

## 2023-05-26 LAB — POCT GLYCOSYLATED HEMOGLOBIN (HGB A1C): Hemoglobin A1C: 7.5 % — AB (ref 4.0–5.6)

## 2023-05-26 NOTE — Progress Notes (Signed)
Patient ID: Zachary Clay., male   DOB: 30-Mar-1961, 62 y.o.   MRN: [id number]  HPI: Zachary Clay. is a 62 y.o.-year-old male, returning for follow-up for DM2, dx in 1998, non-insulin-dependent, uncontrolled, with complications (CKD). Pt. previously saw Dr. Everardo All, but last visit with me 3 months ago.  Interim history: No increased urination, blurry vision, nausea, chest pain.   Reviewed HbA1c: Lab Results  Component Value Date   HGBA1C 7.5 (A) 02/19/2023   HGBA1C 8.0 (H) 09/11/2022   HGBA1C 8.0 (A) 06/10/2022   HGBA1C 7.1 (A) 01/29/2022   HGBA1C 8.1 (A) 10/23/2021   HGBA1C 8.0 (A) 08/15/2021   HGBA1C 8.0 (A) 05/14/2021   HGBA1C 7.9 (H) 04/25/2021   HGBA1C 8.7 (A) 09/27/2020   HGBA1C 9.5 (H) 08/31/2020   Pt is on a regimen of: - Metformin ER 1000 >> 500 mg 2x a day - a little nausea - Glimepiride 0.5 mg before breakfast (decreased 10/2021) - Rybelsus 14 mg before breakfast (increased 10/2021) >> Ozempic 0.5 >> 1 mg weekly  -  >> stopped 01/2022 - Farxiga 10 mg before breakfast 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 He lost weight on it. He tried Trulicity >> pbs with the injector. He tried Actos but developed edema.  At last 2 visits, he was not taking his blood sugars. He is checking once a day: - am: n/c >> 128-200 (ave 150) >> 150 >> 110-120, 178 >> 140-170 - 2h after b'fast: n/c - before lunch: n/c - 2h after lunch: n/c - before dinner: n/c - 2h after dinner: n/c - bedtime: n/c - nighttime: n/c Lowest sugar was 120 >> 128 >> 110 >> 140 Highest sugar was 210 >> 200 >> 178 >> 170 (Thanksgiving)  Glucometer: One Touch ultra  -+ Mild CKD, last BUN/creatinine:  Lab Results  Component Value Date   BUN 20 09/18/2022   BUN 21 09/11/2022   CREATININE 1.35 09/18/2022   CREATININE 1.20 09/11/2022   Lab Results  Component Value Date   MICRALBCREAT 57.1 (H) 10/24/2021   MICRALBCREAT 35.9 (H)  05/07/2017   MICRALBCREAT 30.2 (H) 02/10/2014   MICRALBCREAT 6.8 12/31/2012   MICRALBCREAT 4.5 10/31/2011   MICRALBCREAT 5.6 02/01/2011   MICRALBCREAT 6.3 11/01/2010   MICRALBCREAT 54.8 (H) 05/29/2009   MICRALBCREAT 14.7 01/05/2007  On lisinopril 10 mg daily, Farxiga 10 mg daily.  -+ HL; last set of lipids: Lab Results  Component Value Date   CHOL 123 09/11/2022   HDL 36.40 (L) 09/11/2022   LDLCALC 65 09/11/2022   LDLDIRECT 129.0 04/29/2017   TRIG 106.0 09/11/2022   CHOLHDL 3 09/11/2022  On Crestor 20 mg daily and fenofibrate 160 mg daily.  - last eye exam was 09/11/2022: No DR reportedly. + cataracts. Blepharoptosis.  - no numbness and tingling in his feet.  Last foot exam was on 02/19/2023.  ROS: + see HPI  Past Medical History:  Diagnosis Date   Diabetes mellitus    Hyperlipidemia    Personal history of colonic adenomas 02/16/2013   Polycythemia 2012   Hgb 17.8   Past Surgical History:  Procedure Laterality Date   APPENDECTOMY     COLONOSCOPY  02/08/2013   ESOPHAGOGASTRODUODENOSCOPY  2003   [facility]   WISDOM TOOTH EXTRACTION     Social History   Socioeconomic History   Marital status: Married    Spouse name: Not on file   Number of children: Not  on file   Years of education: Not on file   Highest education level: Not on file  Occupational History    Employer: [employer]  Tobacco Use   Smoking status: Every Day    Current packs/day: 3.00    Types: Cigarettes   Smokeless tobacco: Never   Tobacco comments:    smoked 1977- present, up to 3 ppd  Vaping Use   Vaping status: Never Used  Substance and Sexual Activity   Alcohol use: Yes    Alcohol/week: 14.0 standard drinks of alcohol    Types: 14 Cans of beer per week    Comment: Beer   Drug use: No   Sexual activity: Not on file  Other Topics Concern   Not on file  Social History Narrative   Not on file   Social Determinants of Health   Financial Resource Strain: Not on file  Food  Insecurity: Low Risk  (06/18/2022)   [facility]   Hunger Vital Sign    Worried About Running Out of Food in the Last Year: Never true    Within the past 12 months, the food you bought just didn't last and you didn't have money to get more: Not on file  Transportation Needs: No Transportation Needs (06/18/2022)   [facility]   Transportation    In the past 12 months, has lack of reliable transportation kept you from medical appointments, meetings, work or from getting things needed for daily living? : No  Physical Activity: Not on file  Stress: Not on file  Social Connections: Not on file  Intimate Partner Violence: Low Risk  (06/18/2022)   [facility] from [facility] visits prior to 08/24/2022., [facility] visits prior to 08/24/2022.   Safety    How often does anyone, including family and friends, physically hurt you?: Never    How often does anyone, including family and friends, insult or talk down to you?: Never    How often does anyone, including family and friends, threaten you with harm?: Never    How often does anyone, including family and friends, scream or curse at you?: Never   Current Outpatient Medications on File Prior to Visit  Medication Sig Dispense Refill   clotrimazole-betamethasone (LOTRISONE) cream APPLY 1 APPLICATION 2 (TWO) TIMES DAILY TOPICALLY. 135 g 1   dapagliflozin propanediol (FARXIGA) 10 MG TABS tablet Take 1 tablet (10 mg total) by mouth daily. 90 tablet 3   esomeprazole (NEXIUM) 40 MG capsule TAKE 1 CAPSULE DAILY 90 capsule 1   fenofibrate 160 MG tablet Take 1 tablet (160 mg total) by mouth daily. 90 tablet 1   glimepiride (AMARYL) 1 MG tablet Take 0.5 tablets (0.5 mg total) by mouth daily with breakfast. 45 tablet 3   glucose blood (ONE TOUCH ULTRA TEST) test strip 1 each by Other route daily. E11.9 100 each 0   lisinopril (ZESTRIL) 20 MG tablet TAKE 1/2  TABLET DAILY 45 tablet 1   metFORMIN (GLUCOPHAGE-XR) 500 MG 24 hr tablet Take 1 tablet (500 mg total) by mouth 2 (two) times daily. 180 tablet 3   ONETOUCH DELICA LANCETS MISC by Does not apply route. Check blood sugar once daily     rosuvastatin (CRESTOR) 20 MG tablet TAKE 1 TABLET DAILY 90 tablet 1   Semaglutide, 1 MG/DOSE, 4 MG/3ML SOPN Inject 1 mg as directed once a week. 9 mL 3   No current facility-administered  medications on file prior to visit.   Allergies  Allergen Reactions   Pioglitazone     REACTION: edema   Family History  Problem Relation Age of Onset   Heart attack Father 28   Diabetes Mother    Melanoma Sister    Diabetes Maternal Grandmother    Stomach cancer Maternal Grandmother    Diabetes Maternal Grandfather    Stroke Neg Hx    Colon cancer Neg Hx    Esophageal cancer Neg Hx    Rectal cancer Neg Hx    Colon polyps Neg Hx    PE: There were no vitals taken for this visit. Wt Readings from Last 3 Encounters:  02/19/23 198 lb 9.6 oz (90.1 kg)  10/10/22 205 lb 6.4 oz (93.2 kg)  09/11/22 207 lb (93.9 kg)   Constitutional: overweight, in NAD Eyes: no exophthalmos ENT: no thyromegaly, no cervical lymphadenopathy Cardiovascular: RRR, No MRG Respiratory: CTA B Musculoskeletal: no deformities Skin:  + Eczema rash on dorsum of hands Neurological: no tremor with outstretched hands  ASSESSMENT: 1. DM2, non-insulin-dependent, uncontrolled, with complications - CKD  2. HL  PLAN:  1. Patient with longstanding, uncontrolled, type 2 diabetes, on oral and diabetic regimen with metformin, sulfonylurea, SGLT2 inhibitor and also weekly GLP-1 receptor agonist, with improving control.  At last visit, HbA1c was 7.5% and sugars in the morning appears to be at goal and improved in the month prior to the visit.  He was not checking sugars later in the day and I strongly advised him to do so.  We did not change his regimen at that time. -At today's visit, he mentions that he  was helping in the disaster relief program for [employer] for the last 2 months.  He just returned home before Thanksgiving.  He continues to only check blood sugars in the morning and he noticed that they have been higher lately, especially during Thanksgiving, when they got 170.  He is still not checking later in the day.  I again explained the rationale of trying to check later, whenever he can, rotating the check times.  We also discussed about the benefits of a CGM.  He is aware of the devices his mother is wearing one.  However, he would like to continue fingersticks for now.  Due to the stable HbA1c and the lack of data later in the day, for now we will continue the current regimen and I will have him come back in 3 months for recheck. -I advised him to: Patient Instructions  Please continue: - Metformin ER 500 mg 2x a day - Glimepiride 0.5 mg before breakfast - Farxiga 10 mg before breakfast - Ozempic 1 mg weekly  Please check some sugars later in the day.  Please stop at the lab.  Please return in 3 months.   - we checked his HbA1c: 7.5% (stable) - advised to check sugars at different times of the day - 1x a day, rotating check times - advised for yearly eye exams >> he is UTD - will check an ACR today - return to clinic in 3-4 months  2. HL -Reviewed latest lipid panel from 08/2022: LDL at goal, much decreased from baseline, HDL low: Lab Results  Component Value Date   CHOL 123 09/11/2022   HDL 36.40 (L) 09/11/2022   LDLCALC 65 09/11/2022   LDLDIRECT 129.0 04/29/2017   TRIG 106.0 09/11/2022   CHOLHDL 3 09/11/2022  -He continues on Crestor 20 mg daily and fenofibrate 160  mg daily without side effects  Carlus Pavlov, MD PhD [facility] Endocrinology

## 2023-05-26 NOTE — Patient Instructions (Addendum)
Please continue: - Metformin ER 500 mg 2x a day - Glimepiride 0.5 mg before breakfast - Farxiga 10 mg before breakfast - Ozempic 1 mg weekly  Please check some sugars later in the day.  Please stop at the lab.  Please return in 3 months.

## 2023-05-27 LAB — MICROALBUMIN / CREATININE URINE RATIO
Creatinine, Urine: 52 mg/dL (ref 20–320)
Microalb Creat Ratio: 848 mg/g{creat} — ABNORMAL HIGH (ref <30)
Microalb, Ur: 44.1 mg/dL

## 2023-06-13 ENCOUNTER — Ambulatory Visit: Payer: BC Managed Care – PPO | Admitting: Nurse Practitioner

## 2023-06-13 ENCOUNTER — Encounter: Payer: Self-pay | Admitting: Nurse Practitioner

## 2023-06-13 VITALS — BP 110/62 | HR 62 | Temp 97.2°F | Ht 66.0 in | Wt 204.0 lb

## 2023-06-13 DIAGNOSIS — E1169 Type 2 diabetes mellitus with other specified complication: Secondary | ICD-10-CM | POA: Diagnosis not present

## 2023-06-13 DIAGNOSIS — K219 Gastro-esophageal reflux disease without esophagitis: Secondary | ICD-10-CM

## 2023-06-13 DIAGNOSIS — N1831 Chronic kidney disease, stage 3a: Secondary | ICD-10-CM

## 2023-06-13 DIAGNOSIS — E1122 Type 2 diabetes mellitus with diabetic chronic kidney disease: Secondary | ICD-10-CM

## 2023-06-13 DIAGNOSIS — Z7984 Long term (current) use of oral hypoglycemic drugs: Secondary | ICD-10-CM

## 2023-06-13 DIAGNOSIS — G4733 Obstructive sleep apnea (adult) (pediatric): Secondary | ICD-10-CM | POA: Diagnosis not present

## 2023-06-13 DIAGNOSIS — Z720 Tobacco use: Secondary | ICD-10-CM

## 2023-06-13 DIAGNOSIS — N401 Enlarged prostate with lower urinary tract symptoms: Secondary | ICD-10-CM

## 2023-06-13 DIAGNOSIS — I10 Essential (primary) hypertension: Secondary | ICD-10-CM | POA: Diagnosis not present

## 2023-06-13 DIAGNOSIS — R350 Frequency of micturition: Secondary | ICD-10-CM

## 2023-06-13 DIAGNOSIS — E785 Hyperlipidemia, unspecified: Secondary | ICD-10-CM

## 2023-06-13 DIAGNOSIS — N521 Erectile dysfunction due to diseases classified elsewhere: Secondary | ICD-10-CM

## 2023-06-13 MED ORDER — TAMSULOSIN HCL 0.4 MG PO CAPS: 0.4000 mg | ORAL_CAPSULE | Freq: Every day | ORAL | 0 refills | Status: DC

## 2023-06-13 NOTE — Patient Instructions (Signed)
It was great to see you!  I have ordered a CT scan of your lungs  Start flomax 1 capsule daily in the evenings to help with your urine   Let's follow-up in 3 months, sooner if you have concerns.  If a referral was placed today, you will be contacted for an appointment. Please note that routine referrals can sometimes take up to 3-4 weeks to process. Please call our office if you haven't heard anything after this time frame.  Take care,  Rodman Pickle, NP

## 2023-06-13 NOTE — Assessment & Plan Note (Signed)
He reports frequent urination and difficulty initiating urination. We will initiate Flomax 0.4mg  capsule daily in the evening. Follow-up in 2-3 months. Last PSA was normal.

## 2023-06-13 NOTE — Assessment & Plan Note (Signed)
Chronic, stable. Continue lisinopril 20mg  daily. Recent CMP, CBC reviewed.

## 2023-06-13 NOTE — Assessment & Plan Note (Signed)
Chronic, stable.  Continue wearing CPAP at night.

## 2023-06-13 NOTE — Assessment & Plan Note (Signed)
He has had difficulty with erections for the past couple of years, potentially secondary to age and diabetes. He declines medication for this.

## 2023-06-13 NOTE — Assessment & Plan Note (Signed)
His LDL has improved from 143 to 65 and HDL from 29 to 36 with Rosuvastatin therapy, though he does not exercise. We will continue Rosuvastatin 20mg  daily and encourage exercise to further improve HDL.

## 2023-06-13 NOTE — Assessment & Plan Note (Signed)
Chronic, ongoing. His last A1c was 7.5%. His morning blood sugars have been elevated (160-170) after stopping Januvia, and he has been using leftover Rybelsus 14mg  daily. We will discontinue Rybelsus and start Ozempic once weekly as prescribed by endocrinology, while continuing Glimepiride 0.5mg  daily, farxiga 10mg  daily and Metformin 500mg  twice daily. Continue collaboration with specialist. He is up to date on eye and foot exam.

## 2023-06-13 NOTE — Assessment & Plan Note (Signed)
Chronic, stable. Continue nexium 40mg  daily.

## 2023-06-13 NOTE — Progress Notes (Signed)
New Patient Visit  BP 110/62 (BP Location: Left Arm, Patient Position: Sitting, Cuff Size: Normal)   Pulse 62   Temp (!) 97.2 F (36.2 C)   Ht 5\' 6"  (1.676 m)   Wt 204 lb (92.5 kg)   SpO2 99%   BMI 32.93 kg/m    Subjective:    Patient ID: Zachary Clay., male    DOB: 1960-08-09, 62 y.o.   MRN: [id number]  CC: Chief Complaint  Patient presents with   Transfer of Care    Concerns with diabetes    HPI: Zachary Clay. is a 62 y.o. male presents for new patient visit to establish care.  Introduced to Publishing rights manager role and practice setting.  All questions answered.  Discussed provider/patient relationship and expectations.  Discussed the use of AI scribe software for clinical note transcription with the patient, who gave verbal consent to proceed.  History of Present Illness   A 62 year old man with a history of type 2 diabetes, hypertension, and hyperlipidemia presents with increased fatigue and frequent urination. He reports that his blood sugars have been around 160-170 in the mornings, up from around 120 since discontinuing Januvia. He has been taking leftover Rybelsus 14mg  daily, but plans to start Ozempic once the Rybelsus runs out. He is currently following with endocrinology. He also takes metformin XR 500mg  BID, farxiga 10mg  daily, and glimepiride 0.5mg  daily. He denies chest pain and shortness of breath. He notes that he sometimes has a hard time starting to urinate and other times, he will have to go back after a few minutes and urinate more.     He is a long-term smoker and drinks beer occasionally. He smokes about 2.5 ppd since he was 62 years old. He has been experiencing some dryness and cracking of his skin, which he attributes to his work and frequent hand washing. He also reports a ticklish throat and frequent coughing, which he attributes to his smoking and cold weather. He denies any chest pain but reports occasional discomfort in his left abdomen. He also  reports occasional dizziness upon standing.   He has a history of sleep apnea and uses his CPAP every night.    He has a history of GERD and takes nexium 40mg  daily.     Depression and Anxiety Screen done:     06/13/2023   10:02 AM 09/11/2022   10:23 AM 10/24/2021    8:41 AM 04/25/2021    8:58 AM 08/31/2020    7:45 AM  Depression screen PHQ 2/9  Decreased Interest 1 0 0 0 0  Down, Depressed, Hopeless 0 0 0 0 0  PHQ - 2 Score 1 0 0 0 0  Altered sleeping 1 0 0 0 0  Tired, decreased energy 3 0 0 1 0  Change in appetite 1 0 0 1 0  Feeling bad or failure about yourself  0 0 0 0 0  Trouble concentrating 0 0 0 0 0  Moving slowly or fidgety/restless 0 0 0 0 0  Suicidal thoughts 0 0 0 0 0  PHQ-9 Score 6 0 0 2 0  Difficult doing work/chores Not difficult at all Not difficult at all Not difficult at all Not difficult at all Not difficult at all      06/13/2023   10:03 AM 09/11/2022   10:24 AM  GAD 7 : Generalized Anxiety Score  Nervous, Anxious, on Edge 0 0  Control/stop worrying 0 0  Worry too much -  different things 0 0  Trouble relaxing 0 0  Restless 0 0  Easily annoyed or irritable 1 0  Afraid - awful might happen 0 0  Total GAD 7 Score 1 0  Anxiety Difficulty Not difficult at all Not difficult at all    Past Medical History:  Diagnosis Date   Diabetes mellitus    GERD (gastroesophageal reflux disease)    Hyperlipidemia    Hypertension    Personal history of colonic adenomas 02/16/2013   Polycythemia 06/24/2010   Hgb 17.8   Sleep apnea     Past Surgical History:  Procedure Laterality Date   APPENDECTOMY     COLONOSCOPY  02/08/2013   ESOPHAGOGASTRODUODENOSCOPY  2003   [facility]   WISDOM TOOTH EXTRACTION      Family History  Problem Relation Age of Onset   Diabetes Mother    Heart attack Father 69   Melanoma Sister    Diabetes Maternal Grandmother    Stomach cancer Maternal Grandmother    Diabetes Maternal Grandfather    Stroke Neg Hx    Colon cancer Neg Hx     Esophageal cancer Neg Hx    Rectal cancer Neg Hx    Colon polyps Neg Hx      Social History   Tobacco Use   Smoking status: Every Day    Current packs/day: 3.00    Average packs/day: 3.0 packs/day for 47.0 years (141.0 ttl pk-yrs)    Types: Cigarettes    Start date: 06/12/1976   Smokeless tobacco: Never   Tobacco comments:    smoked 1977- present, up to 3 ppd  Vaping Use   Vaping status: Never Used  Substance Use Topics   Alcohol use: Yes    Alcohol/week: 2.0 standard drinks of alcohol    Types: 2 Cans of beer per week    Comment: Beer   Drug use: No    Current Outpatient Medications on File Prior to Visit  Medication Sig Dispense Refill   clotrimazole-betamethasone (LOTRISONE) cream APPLY 1 APPLICATION 2 (TWO) TIMES DAILY TOPICALLY. 135 g 1   dapagliflozin propanediol (FARXIGA) 10 MG TABS tablet Take 1 tablet (10 mg total) by mouth daily. 90 tablet 3   esomeprazole (NEXIUM) 40 MG capsule TAKE 1 CAPSULE DAILY 90 capsule 1   fenofibrate 160 MG tablet Take 1 tablet (160 mg total) by mouth daily. 90 tablet 1   glimepiride (AMARYL) 1 MG tablet Take 0.5 tablets (0.5 mg total) by mouth daily with breakfast. 45 tablet 3   glucose blood (ONE TOUCH ULTRA TEST) test strip 1 each by Other route daily. E11.9 100 each 0   lisinopril (ZESTRIL) 20 MG tablet TAKE 1/2 TABLET DAILY 45 tablet 1   metFORMIN (GLUCOPHAGE-XR) 500 MG 24 hr tablet Take 1 tablet (500 mg total) by mouth 2 (two) times daily. 180 tablet 3   ONETOUCH DELICA LANCETS MISC by Does not apply route. Check blood sugar once daily     rosuvastatin (CRESTOR) 20 MG tablet TAKE 1 TABLET DAILY 90 tablet 1   Semaglutide, 1 MG/DOSE, 4 MG/3ML SOPN Inject 1 mg as directed once a week. 9 mL 3   No current facility-administered medications on file prior to visit.     Review of Systems  Constitutional:  Positive for fatigue. Negative for fever.  HENT: Negative.    Eyes: Negative.   Respiratory:  Positive for cough. Negative for  chest tightness and shortness of breath.   Cardiovascular: Negative.   Gastrointestinal: Negative.  Endocrine: Negative.   Genitourinary:  Positive for difficulty urinating (at times starting stream) and frequency.  Musculoskeletal: Negative.   Skin: Negative.   Neurological: Negative.   Psychiatric/Behavioral: Negative.        Objective:    BP 110/62 (BP Location: Left Arm, Patient Position: Sitting, Cuff Size: Normal)   Pulse 62   Temp (!) 97.2 F (36.2 C)   Ht 5\' 6"  (1.676 m)   Wt 204 lb (92.5 kg)   SpO2 99%   BMI 32.93 kg/m   Wt Readings from Last 3 Encounters:  06/13/23 204 lb (92.5 kg)  05/26/23 206 lb 6.4 oz (93.6 kg)  02/19/23 198 lb 9.6 oz (90.1 kg)    BP Readings from Last 3 Encounters:  06/13/23 110/62  05/26/23 122/70  02/19/23 120/70    Physical Exam Vitals and nursing note reviewed.  Constitutional:      General: He is not in acute distress.    Appearance: Normal appearance.  HENT:     Head: Normocephalic and atraumatic.     Right Ear: Tympanic membrane, ear canal and external ear normal.     Left Ear: Tympanic membrane, ear canal and external ear normal.  Eyes:     Conjunctiva/sclera: Conjunctivae normal.  Cardiovascular:     Rate and Rhythm: Normal rate and regular rhythm.     Pulses: Normal pulses.     Heart sounds: Normal heart sounds.  Pulmonary:     Effort: Pulmonary effort is normal.     Breath sounds: Normal breath sounds.  Abdominal:     Palpations: Abdomen is soft.     Tenderness: There is no abdominal tenderness.  Musculoskeletal:        General: Normal range of motion.     Cervical back: Normal range of motion and neck supple. No tenderness.     Right lower leg: No edema.     Left lower leg: No edema.  Lymphadenopathy:     Cervical: No cervical adenopathy.  Skin:    General: Skin is warm and dry.     Comments: Dry, cracked skin on hands  Neurological:     General: No focal deficit present.     Mental Status: He is alert and  oriented to person, place, and time.     Cranial Nerves: No cranial nerve deficit.     Coordination: Coordination normal.     Gait: Gait normal.  Psychiatric:        Mood and Affect: Mood normal.        Behavior: Behavior normal.        Thought Content: Thought content normal.        Judgment: Judgment normal.        Assessment & Plan:   Problem List Items Addressed This Visit       Cardiovascular and Mediastinum   Essential hypertension - Primary   Chronic, stable. Continue lisinopril 20mg  daily. Recent CMP, CBC reviewed.         Respiratory   OBSTRUCTIVE SLEEP APNEA   Chronic, stable. Continue wearing CPAP at night.         Digestive   GERD, SEVERE   Chronic, stable. Continue nexium 40mg  daily.         Endocrine   Type 2 diabetes mellitus with chronic kidney disease, without long-term current use of insulin (HCC)   Chronic, ongoing. His last A1c was 7.5%. His morning blood sugars have been elevated (160-170) after stopping Januvia, and he has been using  leftover Rybelsus 14mg  daily. We will discontinue Rybelsus and start Ozempic once weekly as prescribed by endocrinology, while continuing Glimepiride 0.5mg  daily, farxiga 10mg  daily and Metformin 500mg  twice daily. Continue collaboration with specialist. He is up to date on eye and foot exam.       Hyperlipidemia associated with type 2 diabetes mellitus (HCC)   His LDL has improved from 143 to 65 and HDL from 29 to 36 with Rosuvastatin therapy, though he does not exercise. We will continue Rosuvastatin 20mg  daily and encourage exercise to further improve HDL.      Erectile dysfunction associated with type 2 diabetes mellitus (HCC)   He has had difficulty with erections for the past couple of years, potentially secondary to age and diabetes. He declines medication for this.         Other   Tobacco use   He has smoked 2.5 packs per day for 47 years. We will order a low-dose CT scan for lung cancer screening and  encourage smoking cessation.      Relevant Orders   CT CHEST LUNG CANCER SCREENING LOW DOSE WO CONTRAST   Benign prostatic hyperplasia with urinary frequency   He reports frequent urination and difficulty initiating urination. We will initiate Flomax 0.4mg  capsule daily in the evening. Follow-up in 2-3 months. Last PSA was normal.         Follow up plan: Return in about 3 months (around 09/11/2023) for CPE.  Halli Equihua A Bryant Lipps

## 2023-06-13 NOTE — Assessment & Plan Note (Signed)
He has smoked 2.5 packs per day for 47 years. We will order a low-dose CT scan for lung cancer screening and encourage smoking cessation.

## 2023-06-30 ENCOUNTER — Encounter: Payer: Self-pay | Admitting: Nurse Practitioner

## 2023-07-09 ENCOUNTER — Other Ambulatory Visit: Payer: Self-pay | Admitting: Family Medicine

## 2023-07-09 ENCOUNTER — Ambulatory Visit
Admission: RE | Admit: 2023-07-09 | Discharge: 2023-07-09 | Disposition: A | Discharge status: Home / Self Care | Payer: BC Managed Care – PPO | Source: Ambulatory Visit | Attending: Nurse Practitioner | Admitting: Nurse Practitioner

## 2023-07-09 DIAGNOSIS — Z720 Tobacco use: Secondary | ICD-10-CM

## 2023-07-09 DIAGNOSIS — F17210 Nicotine dependence, cigarettes, uncomplicated: Secondary | ICD-10-CM | POA: Diagnosis not present

## 2023-07-20 ENCOUNTER — Encounter: Payer: Self-pay | Admitting: Family

## 2023-07-22 ENCOUNTER — Other Ambulatory Visit: Payer: Self-pay

## 2023-07-22 DIAGNOSIS — I38 Endocarditis, valve unspecified: Secondary | ICD-10-CM

## 2023-07-25 DIAGNOSIS — G4733 Obstructive sleep apnea (adult) (pediatric): Secondary | ICD-10-CM | POA: Diagnosis not present

## 2023-09-11 ENCOUNTER — Ambulatory Visit: Payer: BC Managed Care – PPO | Admitting: Nurse Practitioner

## 2023-09-11 ENCOUNTER — Encounter: Payer: Self-pay | Admitting: Nurse Practitioner

## 2023-09-11 VITALS — BP 124/68 | HR 74 | Temp 97.3°F | Ht 66.0 in | Wt 211.0 lb

## 2023-09-11 DIAGNOSIS — E1169 Type 2 diabetes mellitus with other specified complication: Secondary | ICD-10-CM

## 2023-09-11 DIAGNOSIS — L989 Disorder of the skin and subcutaneous tissue, unspecified: Secondary | ICD-10-CM

## 2023-09-11 DIAGNOSIS — J432 Centrilobular emphysema: Secondary | ICD-10-CM | POA: Diagnosis not present

## 2023-09-11 DIAGNOSIS — I10 Essential (primary) hypertension: Secondary | ICD-10-CM | POA: Diagnosis not present

## 2023-09-11 DIAGNOSIS — I700 Atherosclerosis of aorta: Secondary | ICD-10-CM

## 2023-09-11 DIAGNOSIS — E785 Hyperlipidemia, unspecified: Secondary | ICD-10-CM | POA: Diagnosis not present

## 2023-09-11 DIAGNOSIS — Z7984 Long term (current) use of oral hypoglycemic drugs: Secondary | ICD-10-CM

## 2023-09-11 DIAGNOSIS — Z720 Tobacco use: Secondary | ICD-10-CM

## 2023-09-11 DIAGNOSIS — Z0000 Encounter for general adult medical examination without abnormal findings: Secondary | ICD-10-CM

## 2023-09-11 DIAGNOSIS — N401 Enlarged prostate with lower urinary tract symptoms: Secondary | ICD-10-CM | POA: Diagnosis not present

## 2023-09-11 DIAGNOSIS — N1831 Chronic kidney disease, stage 3a: Secondary | ICD-10-CM

## 2023-09-11 DIAGNOSIS — R350 Frequency of micturition: Secondary | ICD-10-CM

## 2023-09-11 DIAGNOSIS — E1122 Type 2 diabetes mellitus with diabetic chronic kidney disease: Secondary | ICD-10-CM | POA: Diagnosis not present

## 2023-09-11 DIAGNOSIS — E669 Obesity, unspecified: Secondary | ICD-10-CM

## 2023-09-11 LAB — CBC WITH DIFFERENTIAL/PLATELET
Basophils Absolute: 0.1 10*3/uL (ref 0.0–0.1)
Basophils Relative: 1 % (ref 0.0–3.0)
Eosinophils Absolute: 0.3 10*3/uL (ref 0.0–0.7)
Eosinophils Relative: 5.7 % — ABNORMAL HIGH (ref 0.0–5.0)
HCT: 48.1 % (ref 39.0–52.0)
Hemoglobin: 16.2 g/dL (ref 13.0–17.0)
Lymphocytes Relative: 24.8 % (ref 12.0–46.0)
Lymphs Abs: 1.5 10*3/uL (ref 0.7–4.0)
MCHC: 33.8 g/dL (ref 30.0–36.0)
MCV: 88.5 fl (ref 78.0–100.0)
Monocytes Absolute: 0.5 10*3/uL (ref 0.1–1.0)
Monocytes Relative: 8.7 % (ref 3.0–12.0)
Neutro Abs: 3.7 10*3/uL (ref 1.4–7.7)
Neutrophils Relative %: 59.8 % (ref 43.0–77.0)
Platelets: 176 10*3/uL (ref 150.0–400.0)
RBC: 5.43 Mil/uL (ref 4.22–5.81)
RDW: 14.1 % (ref 11.5–15.5)
WBC: 6.1 10*3/uL (ref 4.0–10.5)

## 2023-09-11 LAB — COMPREHENSIVE METABOLIC PANEL
ALT: 24 U/L (ref 0–53)
AST: 20 U/L (ref 0–37)
Albumin: 4.4 g/dL (ref 3.5–5.2)
Alkaline Phosphatase: 54 U/L (ref 39–117)
BUN: 16 mg/dL (ref 6–23)
CO2: 29 meq/L (ref 19–32)
Calcium: 9.5 mg/dL (ref 8.4–10.5)
Chloride: 100 meq/L (ref 96–112)
Creatinine, Ser: 1.12 mg/dL (ref 0.40–1.50)
GFR: 69.86 mL/min (ref >60.00)
Glucose, Bld: 151 mg/dL — ABNORMAL HIGH (ref 70–99)
Potassium: 4.9 meq/L (ref 3.5–5.1)
Sodium: 137 meq/L (ref 135–145)
Total Bilirubin: 1.1 mg/dL (ref 0.2–1.2)
Total Protein: 6.4 g/dL (ref 6.0–8.3)

## 2023-09-11 LAB — LIPID PANEL
Cholesterol: 126 mg/dL (ref 0–200)
HDL: 43.2 mg/dL (ref >39.00)
LDL Cholesterol: 59 mg/dL (ref 0–99)
NonHDL: 82.85
Total CHOL/HDL Ratio: 3
Triglycerides: 119 mg/dL (ref 0.0–149.0)
VLDL: 23.8 mg/dL (ref 0.0–40.0)

## 2023-09-11 LAB — PSA: PSA: 2.43 ng/mL (ref 0.10–4.00)

## 2023-09-11 LAB — HEMOGLOBIN A1C: Hgb A1c MFr Bld: 9.5 % — ABNORMAL HIGH (ref 4.6–6.5)

## 2023-09-11 MED ORDER — TAMSULOSIN HCL 0.4 MG PO CAPS: 0.4000 mg | ORAL_CAPSULE | Freq: Every day | ORAL | 1 refills | Status: DC

## 2023-09-11 MED ORDER — FENOFIBRATE 160 MG PO TABS: 160.0000 mg | ORAL_TABLET | Freq: Every day | ORAL | 1 refills | Status: DC

## 2023-09-11 NOTE — Assessment & Plan Note (Signed)
Chronic, stable. Continue flomax 0.4mg  daily. Check PSA today

## 2023-09-11 NOTE — Assessment & Plan Note (Signed)
 Chronic, stable. Continue rosuvastatin 20mg  daily and fenofibrate 160mg  daily. Check CMP, CBC, lipid panel today.

## 2023-09-11 NOTE — Assessment & Plan Note (Signed)
Health maintenance reviewed and updated. Discussed nutrition, exercise. Follow-up 1 year.

## 2023-09-11 NOTE — Assessment & Plan Note (Signed)
 He has smoked 2.5-3 packs per day for 47 years. Recommend complete tobacco cessation. Up to date on lung cancer screening.

## 2023-09-11 NOTE — Assessment & Plan Note (Signed)
 BMI 34. Recommend focus on nutrition and exercise.

## 2023-09-11 NOTE — Assessment & Plan Note (Signed)
 Noted on CT scan 07/09/23. Currently not having symptoms, still smoking 3ppd. Recommend complete tobacco cessation.

## 2023-09-11 NOTE — Progress Notes (Signed)
 BP 124/68 (BP Location: Left Arm, Patient Position: Sitting, Cuff Size: Normal)   Pulse 74   Temp (!) 97.3 F (36.3 C)   Ht 5\' 6"  (1.676 m)   Wt 211 lb (95.7 kg)   SpO2 98%   BMI 34.06 kg/m    Subjective:    Patient ID: Zachary Ensign., male    DOB: 18-Jul-1960, 63 y.o.   MRN: [id number]  CC: Chief Complaint  Patient presents with   Annual Exam    With fasting lab work, no concerns    HPI: Zachary Apfel. is a 63 y.o. male presenting on 09/11/2023 for comprehensive medical examination. Current medical complaints include:none  He currently lives with: wife, FIL  Depression and Anxiety Screen done today and results listed below:     09/11/2023    8:35 AM 06/13/2023   10:02 AM 09/11/2022   10:23 AM 10/24/2021    8:41 AM 04/25/2021    8:58 AM  Depression screen PHQ 2/9  Decreased Interest 0 1 0 0 0  Down, Depressed, Hopeless 0 0 0 0 0  PHQ - 2 Score 0 1 0 0 0  Altered sleeping 0 1 0 0 0  Tired, decreased energy 1 3 0 0 1  Change in appetite 1 1 0 0 1  Feeling bad or failure about yourself  0 0 0 0 0  Trouble concentrating 0 0 0 0 0  Moving slowly or fidgety/restless 0 0 0 0 0  Suicidal thoughts 0 0 0 0 0  PHQ-9 Score 2 6 0 0 2  Difficult doing work/chores Not difficult at all Not difficult at all Not difficult at all Not difficult at all Not difficult at all      09/11/2023    8:36 AM 06/13/2023   10:03 AM 09/11/2022   10:24 AM  GAD 7 : Generalized Anxiety Score  Nervous, Anxious, on Edge 0 0 0  Control/stop worrying 0 0 0  Worry too much - different things 0 0 0  Trouble relaxing 0 0 0  Restless 0 0 0  Easily annoyed or irritable 0 1 0  Afraid - awful might happen 0 0 0  Total GAD 7 Score 0 1 0  Anxiety Difficulty Not difficult at all Not difficult at all Not difficult at all    The patient does not have a history of falls. I did not complete a risk assessment for falls. A plan of care for falls was not documented.   Past Medical History:  Past Medical  History:  Diagnosis Date   Diabetes mellitus    GERD (gastroesophageal reflux disease)    Hyperlipidemia    Hypertension    Personal history of colonic adenomas 02/16/2013   Polycythemia 06/24/2010   Hgb 17.8   Sleep apnea     Surgical History:  Past Surgical History:  Procedure Laterality Date   APPENDECTOMY     COLONOSCOPY  02/08/2013   ESOPHAGOGASTRODUODENOSCOPY  2003   [facility]   WISDOM TOOTH EXTRACTION      Medications:  Current Outpatient Medications on File Prior to Visit  Medication Sig   clotrimazole-betamethasone (LOTRISONE) cream APPLY 1 APPLICATION 2 (TWO) TIMES DAILY TOPICALLY.   dapagliflozin propanediol (FARXIGA) 10 MG TABS tablet Take 1 tablet (10 mg total) by mouth daily.   esomeprazole (NEXIUM) 40 MG capsule TAKE 1 CAPSULE DAILY   glimepiride (AMARYL) 1 MG tablet Take 0.5 tablets (0.5 mg total) by mouth daily with breakfast.  glucose blood (ONE TOUCH ULTRA TEST) test strip 1 each by Other route daily. E11.9   lisinopril (ZESTRIL) 20 MG tablet TAKE 1/2 TABLET DAILY   metFORMIN (GLUCOPHAGE-XR) 500 MG 24 hr tablet Take 1 tablet (500 mg total) by mouth 2 (two) times daily.   ONETOUCH DELICA LANCETS MISC by Does not apply route. Check blood sugar once daily   rosuvastatin (CRESTOR) 20 MG tablet TAKE 1 TABLET DAILY   No current facility-administered medications on file prior to visit.    Allergies:  Allergies  Allergen Reactions   Pioglitazone     REACTION: edema    Social History:  Social History   Socioeconomic History   Marital status: Married    Spouse name: Not on file   Number of children: Not on file   Years of education: Not on file   Highest education level: Not on file  Occupational History    Employer: [employer]  Tobacco Use   Smoking status: Every Day    Current packs/day: 3.00    Average packs/day: 3.0 packs/day for 47.2 years (141.7 ttl pk-yrs)    Types: Cigarettes    Start date: 06/12/1976   Smokeless tobacco: Never    Tobacco comments:    smoked 1977- present, up to 3 ppd  Vaping Use   Vaping status: Never Used  Substance and Sexual Activity   Alcohol use: Yes    Alcohol/week: 2.0 standard drinks of alcohol    Types: 2 Cans of beer per week    Comment: Beer   Drug use: No   Sexual activity: Not on file  Other Topics Concern   Not on file  Social History Narrative   Not on file   Social Drivers of Health   Financial Resource Strain: Not on file  Food Insecurity: Low Risk  (06/18/2022)   [facility]   Hunger Vital Sign    Worried About Running Out of Food in the Last Year: Never true    Within the past 12 months, the food you bought just didn't last and you didn't have money to get more: Not on file  Transportation Needs: No Transportation Needs (06/18/2022)   [facility]   Transportation    In the past 12 months, has lack of reliable transportation kept you from medical appointments, meetings, work or from getting things needed for daily living? : No  Physical Activity: Not on file  Stress: Not on file  Social Connections: Not on file  Intimate Partner Violence: Low Risk  (06/18/2022)   [facility] prior to 08/24/2022., [facility] visits prior to 08/24/2022.   Safety    How often does anyone, including family and friends, physically hurt you?: Never    How often does anyone, including family and friends, insult or talk down to you?: Never    How often does anyone, including family and friends, threaten you with harm?: Never    How often does anyone, including family and friends, scream or curse at you?: Never   Social History   Tobacco Use  Smoking Status Every Day   Current packs/day: 3.00   Average packs/day: 3.0 packs/day for 47.2 years (141.7 ttl pk-yrs)   Types: Cigarettes   Start date: 06/12/1976  Smokeless Tobacco Never  Tobacco Comments   smoked 1977-  present, up to 3 ppd   Social History   Substance and Sexual Activity  Alcohol Use Yes   Alcohol/week: 2.0 standard drinks of alcohol   Types: 2 Cans of beer per week   Comment: Beer    Family History:  Family History  Problem Relation Age of Onset   Diabetes Mother    Heart attack Father 46   Melanoma Sister    Diabetes Maternal Grandmother    Stomach cancer Maternal Grandmother    Diabetes Maternal Grandfather    Stroke Neg Hx    Colon cancer Neg Hx    Esophageal cancer Neg Hx    Rectal cancer Neg Hx    Colon polyps Neg Hx     Past medical history, surgical history, medications, allergies, family history and social history reviewed with patient today and changes made to appropriate areas of the chart.   Review of Systems  Constitutional: Negative.   HENT: Negative.    Eyes: Negative.   Respiratory: Negative.    Cardiovascular: Negative.   Gastrointestinal: Negative.   Genitourinary: Negative.   Musculoskeletal: Negative.   Skin: Negative.   Neurological: Negative.   Psychiatric/Behavioral: Negative.     All other ROS negative except what is listed above and in the HPI.      Objective:    BP 124/68 (BP Location: Left Arm, Patient Position: Sitting, Cuff Size: Normal)   Pulse 74   Temp (!) 97.3 F (36.3 C)   Ht 5\' 6"  (1.676 m)   Wt 211 lb (95.7 kg)   SpO2 98%   BMI 34.06 kg/m   Wt Readings from Last 3 Encounters:  09/11/23 211 lb (95.7 kg)  06/13/23 204 lb (92.5 kg)  05/26/23 206 lb 6.4 oz (93.6 kg)    Physical Exam Vitals and nursing note reviewed.  Constitutional:      General: He is not in acute distress.    Appearance: Normal appearance.  HENT:     Head: Normocephalic and atraumatic.     Right Ear: Tympanic membrane, ear canal and external ear normal.     Left Ear: Tympanic membrane, ear canal and external ear normal.     Mouth/Throat:     Mouth: Mucous membranes are moist.     Pharynx: No posterior oropharyngeal erythema.  Eyes:      Conjunctiva/sclera: Conjunctivae normal.  Cardiovascular:     Rate and Rhythm: Normal rate and regular rhythm.     Pulses: Normal pulses.     Heart sounds: Normal heart sounds.  Pulmonary:     Effort: Pulmonary effort is normal.     Breath sounds: Normal breath sounds.  Abdominal:     Palpations: Abdomen is soft.     Tenderness: There is no abdominal tenderness.  Musculoskeletal:        General: Normal range of motion.     Cervical back: Normal range of motion and neck supple. No tenderness.     Right lower leg: No edema.     Left lower leg: No edema.  Lymphadenopathy:     Cervical: No cervical adenopathy.  Skin:    General: Skin is warm and dry.     Comments: Dry, scaly areas to hands. Dark, flat area to left ear  Neurological:     General: No focal deficit present.     Mental Status: He is alert and oriented to person, place, and time.     Cranial Nerves: No cranial nerve deficit.     Coordination: Coordination normal.     Gait: Gait normal.  Psychiatric:  Mood and Affect: Mood normal.        Behavior: Behavior normal.        Thought Content: Thought content normal.        Judgment: Judgment normal.     Results for orders placed or performed in visit on 05/26/23  POCT glycosylated hemoglobin (Hb A1C)   Collection Time: 05/26/23  8:24 AM  Result Value Ref Range   Hemoglobin A1C 7.5 (A) 4.0 - 5.6 %   HbA1c POC (<> result, manual entry)     HbA1c, POC (prediabetic range)     HbA1c, POC (controlled diabetic range)    Microalbumin / creatinine urine ratio   Collection Time: 05/26/23  9:01 AM  Result Value Ref Range   Creatinine, Urine 52 20 - 320 mg/dL   Microalb, Ur 87.5 mg/dL   Microalb Creat Ratio 848 (H) <30 mg/g creat      Assessment & Plan:   Problem List Items Addressed This Visit       Cardiovascular and Mediastinum   Essential hypertension   Chronic, stable. Continue lisinopril 20mg  daily. Check CMP, CBC today       Relevant Medications    fenofibrate 160 MG tablet   Other Relevant Orders   CBC with Differential/Platelet   Comprehensive metabolic panel   Aortic atherosclerosis (HCC)   Noted on CT scan 07/09/23. Continue rosuvastatin 20mg  daily and fenofibrate 160mg  daily.       Relevant Medications   fenofibrate 160 MG tablet     Respiratory   Centrilobular emphysema (HCC)   Noted on CT scan 07/09/23. Currently not having symptoms, still smoking 3ppd. Recommend complete tobacco cessation.         Endocrine   Type 2 diabetes mellitus with chronic kidney disease, without long-term current use of insulin (HCC)   Chronic, ongoing. Continue metformin XR 500mg  BID, glimepiride 0.5mg  daily, and farxiga 10mg  daily. Continue following with endocrinology. Check CMP, CBC, A1c today. Recommend yearly eye exam.       Relevant Orders   CBC with Differential/Platelet   Comprehensive metabolic panel   Hemoglobin A1c   Hyperlipidemia associated with type 2 diabetes mellitus (HCC)   Chronic, stable. Continue rosuvastatin 20mg  daily and fenofibrate 160mg  daily. Check CMP, CBC, lipid panel today.       Relevant Medications   fenofibrate 160 MG tablet   Other Relevant Orders   CBC with Differential/Platelet   Comprehensive metabolic panel   Lipid panel     Other   Tobacco use   He has smoked 2.5-3 packs per day for 47 years. Recommend complete tobacco cessation. Up to date on lung cancer screening.       Obesity (BMI 30-39.9)   BMI 34. Recommend focus on nutrition and exercise.       Benign prostatic hyperplasia with urinary frequency   Chronic, stable. Continue flomax 0.4mg  daily. Check PSA today.       Relevant Orders   PSA   Routine general medical examination at a health care facility - Primary   Health maintenance reviewed and updated. Discussed nutrition, exercise. Follow-up 1 year.        Other Visit Diagnoses       Skin lesion       Dark area to left ear, and possible actinic keratoses to bilateral hands.  Referral placed to dermatology.   Relevant Orders   Ambulatory referral to Dermatology        LABORATORY TESTING:  Health maintenance labs ordered today as  discussed above.   The natural history of prostate cancer and ongoing controversy regarding screening and potential treatment outcomes of prostate cancer has been discussed with the patient. The meaning of a false positive PSA and a false negative PSA has been discussed. He indicates understanding of the limitations of this screening test and wishes to proceed with screening PSA testing.   IMMUNIZATIONS:   - Tdap: Tetanus vaccination status reviewed: last tetanus booster within 10 years. - Influenza: Declined - Pneumovax: Not applicable - Prevnar: Declined - HPV: Not applicable - Shingrix vaccine: Up to date  SCREENING: - Colonoscopy: Up to date  Discussed with patient purpose of the colonoscopy is to detect colon cancer at curable precancerous or early stages   - AAA Screening: Not applicable   PATIENT COUNSELING:    Sexuality: Discussed sexually transmitted diseases, partner selection, use of condoms, avoidance of unintended pregnancy  and contraceptive alternatives.   Advised to avoid cigarette smoking.  I discussed with the patient that most people either abstain from alcohol or drink within safe limits (<=14/week and <=4 drinks/occasion for males, <=7/weeks and <= 3 drinks/occasion for females) and that the risk for alcohol disorders and other health effects rises proportionally with the number of drinks per week and how often a drinker exceeds daily limits.  Discussed cessation/primary prevention of drug use and availability of treatment for abuse.   Diet: Encouraged to adjust caloric intake to maintain  or achieve ideal body weight, to reduce intake of dietary saturated fat and total fat, to limit sodium intake by avoiding high sodium foods and not adding table salt, and to maintain adequate dietary potassium and  calcium preferably from fresh fruits, vegetables, and low-fat dairy products.    stressed the importance of regular exercise  Injury prevention: Discussed safety belts, safety helmets, smoke detector, smoking near bedding or upholstery.   Dental health: Discussed importance of regular tooth brushing, flossing, and dental visits.   Follow up plan: NEXT PREVENTATIVE PHYSICAL DUE IN 1 YEAR. Return in about 6 months (around 03/13/2024) for Diabetes.  Khalessi Blough A Ahmari Garton

## 2023-09-11 NOTE — Assessment & Plan Note (Signed)
 Chronic, ongoing. Continue metformin XR 500mg  BID, glimepiride 0.5mg  daily, and farxiga 10mg  daily. Continue following with endocrinology. Check CMP, CBC, A1c today. Recommend yearly eye exam.

## 2023-09-11 NOTE — Assessment & Plan Note (Signed)
 Chronic, stable. Continue lisinopril 20mg  daily. Check CMP, CBC today

## 2023-09-11 NOTE — Patient Instructions (Signed)
 It was great to see you!  We are checking your labs today and will let you know the results via mychart/phone.   I have refilled your meds  I am placing a referral to dermatology   Let's follow-up in 6 months, sooner if you have concerns.  If a referral was placed today, you will be contacted for an appointment. Please note that routine referrals can sometimes take up to 3-4 weeks to process. Please call our office if you haven't heard anything after this time frame.  Take care,  Rodman Pickle, NP

## 2023-09-11 NOTE — Assessment & Plan Note (Signed)
 Noted on CT scan 07/09/23. Continue rosuvastatin 20mg  daily and fenofibrate 160mg  daily.

## 2023-09-12 ENCOUNTER — Other Ambulatory Visit (HOSPITAL_COMMUNITY): Payer: Self-pay | Admitting: Nurse Practitioner

## 2023-09-12 ENCOUNTER — Encounter: Payer: Self-pay | Admitting: Nurse Practitioner

## 2023-09-12 ENCOUNTER — Ambulatory Visit (HOSPITAL_COMMUNITY)
Admission: RE | Admit: 2023-09-12 | Discharge: 2023-09-12 | Disposition: A | Discharge status: Home / Self Care | Source: Ambulatory Visit | Attending: Family | Admitting: Family

## 2023-09-12 DIAGNOSIS — I358 Other nonrheumatic aortic valve disorders: Secondary | ICD-10-CM | POA: Insufficient documentation

## 2023-09-12 DIAGNOSIS — I38 Endocarditis, valve unspecified: Secondary | ICD-10-CM

## 2023-09-12 DIAGNOSIS — I700 Atherosclerosis of aorta: Secondary | ICD-10-CM

## 2023-09-12 DIAGNOSIS — I77819 Aortic ectasia, unspecified site: Secondary | ICD-10-CM | POA: Insufficient documentation

## 2023-09-12 DIAGNOSIS — I359 Nonrheumatic aortic valve disorder, unspecified: Secondary | ICD-10-CM

## 2023-09-12 LAB — ECHOCARDIOGRAM COMPLETE
AR max vel: 1.71 cm2
AV Area VTI: 2.14 cm2
AV Area mean vel: 2.06 cm2
AV Mean grad: 5 mmHg
AV Peak grad: 10.6 mmHg
Ao pk vel: 1.63 m/s
Area-P 1/2: 3.72 cm2
S' Lateral: 3.4 cm

## 2023-09-17 DIAGNOSIS — L821 Other seborrheic keratosis: Secondary | ICD-10-CM | POA: Diagnosis not present

## 2023-09-17 DIAGNOSIS — L814 Other melanin hyperpigmentation: Secondary | ICD-10-CM | POA: Diagnosis not present

## 2023-09-17 DIAGNOSIS — L308 Other specified dermatitis: Secondary | ICD-10-CM | POA: Diagnosis not present

## 2023-09-17 DIAGNOSIS — D225 Melanocytic nevi of trunk: Secondary | ICD-10-CM | POA: Diagnosis not present

## 2023-09-17 DIAGNOSIS — L570 Actinic keratosis: Secondary | ICD-10-CM | POA: Diagnosis not present

## 2023-09-22 ENCOUNTER — Other Ambulatory Visit: Payer: Self-pay | Admitting: Internal Medicine

## 2023-09-22 ENCOUNTER — Other Ambulatory Visit: Payer: Self-pay | Admitting: Family Medicine

## 2023-09-22 DIAGNOSIS — E1165 Type 2 diabetes mellitus with hyperglycemia: Secondary | ICD-10-CM

## 2023-09-22 DIAGNOSIS — E1169 Type 2 diabetes mellitus with other specified complication: Secondary | ICD-10-CM

## 2023-09-22 DIAGNOSIS — E1122 Type 2 diabetes mellitus with diabetic chronic kidney disease: Secondary | ICD-10-CM

## 2023-09-23 ENCOUNTER — Ambulatory Visit: Payer: BC Managed Care – PPO | Admitting: Internal Medicine

## 2023-09-29 ENCOUNTER — Telehealth: Payer: Self-pay

## 2023-09-29 NOTE — Telephone Encounter (Signed)
 Copied from CRM [id number]. Topic: General - Other >> Sep 29, 2023  1:56 PM Rodman Pickle T wrote: Reason for CRM: patient Is needing a call back regarding his referral

## 2023-09-29 NOTE — Telephone Encounter (Signed)
 Referral was placed on 09/12/2023 for cardiology Status is currently pending  Aldean Baker are you able to follow up on referral?   Forwarding message below.

## 2023-10-09 ENCOUNTER — Encounter: Payer: Self-pay | Admitting: Cardiology

## 2023-10-09 ENCOUNTER — Ambulatory Visit: Attending: Cardiology | Admitting: Cardiology

## 2023-10-09 ENCOUNTER — Other Ambulatory Visit (HOSPITAL_BASED_OUTPATIENT_CLINIC_OR_DEPARTMENT_OTHER): Payer: Self-pay

## 2023-10-09 VITALS — BP 130/70 | HR 67 | Ht 67.0 in | Wt 210.6 lb

## 2023-10-09 DIAGNOSIS — I714 Abdominal aortic aneurysm, without rupture, unspecified: Secondary | ICD-10-CM

## 2023-10-09 DIAGNOSIS — E1122 Type 2 diabetes mellitus with diabetic chronic kidney disease: Secondary | ICD-10-CM | POA: Diagnosis not present

## 2023-10-09 DIAGNOSIS — R0989 Other specified symptoms and signs involving the circulatory and respiratory systems: Secondary | ICD-10-CM

## 2023-10-09 DIAGNOSIS — I700 Atherosclerosis of aorta: Secondary | ICD-10-CM

## 2023-10-09 DIAGNOSIS — R072 Precordial pain: Secondary | ICD-10-CM

## 2023-10-09 DIAGNOSIS — N1831 Chronic kidney disease, stage 3a: Secondary | ICD-10-CM

## 2023-10-09 DIAGNOSIS — I10 Essential (primary) hypertension: Secondary | ICD-10-CM

## 2023-10-09 DIAGNOSIS — G4733 Obstructive sleep apnea (adult) (pediatric): Secondary | ICD-10-CM

## 2023-10-09 DIAGNOSIS — Z720 Tobacco use: Secondary | ICD-10-CM

## 2023-10-09 MED ORDER — ASPIRIN 81 MG PO TBEC: 81.0000 mg | DELAYED_RELEASE_TABLET | Freq: Every day | ORAL | Status: AC

## 2023-10-09 MED ORDER — VARENICLINE TARTRATE (STARTER) 0.5 MG X 11 & 1 MG X 42 PO TBPK: 1.0000 | ORAL_TABLET | ORAL | 0 refills | Status: DC

## 2023-10-09 MED ORDER — VARENICLINE TARTRATE (STARTER) 0.5 MG X 11 & 1 MG X 42 PO TBPK
1.0000 | ORAL_TABLET | ORAL | 0 refills | Status: DC
  Filled 2023-10-09: qty 53, 28d supply, fill #0

## 2023-10-09 MED ORDER — METOPROLOL TARTRATE 100 MG PO TABS: ORAL_TABLET | ORAL | 0 refills | Status: DC

## 2023-10-09 NOTE — Progress Notes (Signed)
 Cardiology Consultation:    Date:  10/09/2023   ID:  Zachary Ensign., DOB March 07, 1961, MRN [id number]  PCP:  Gerre Scull, NP  Cardiologist:  Gypsy Balsam, MD   Referring MD: Gerre Scull, NP   Chief Complaint  Patient presents with   Results    History of Present Illness:    Zachary Carias. is a 63 y.o. male who is being seen today for the evaluation of abnormal echocardiogram at the request of McElwee, Lauren A, NP.  Past medical history significant for essential hypertension, diabetes for many years poorly controlled, dyslipidemia, smoking which is still ongoing he smokes about 2 to 3 packs a day, he was referred to [place] because echocardiogram has been done echocardiogram showed calcification of the aortic valve with only minimal aortic stenosis.  However story is much more concerning.  Overall he said he is doing well that he tried to do things he gets short of breath quite easily he blames this on the fact that he is smoking he is probably right.  Denies having a typical chest pain tightness squeezing pressure burning chest.  He is diabetic and his diabetes is poorly controlled.  Last hemoglobin A1c was 1-9.5.  He does have dyslipidemia and dyslipidemia is well under control with his LDL of 59 this is from September 11, 2023.  He does not exercise on the regular basis he is not on any special diet he denies having any tightness squeezing pressure burning chest.  He does have family history of premature coronary artery disease apparently his father died of myocardial infarction when he was in his 96s.  He never had any heart trouble  Past Medical History:  Diagnosis Date   Diabetes mellitus    GERD (gastroesophageal reflux disease)    Hyperlipidemia    Hypertension    Personal history of colonic adenomas 02/16/2013   Polycythemia 06/24/2010   Hgb 17.8   Sleep apnea     Past Surgical History:  Procedure Laterality Date   APPENDECTOMY     COLONOSCOPY  02/08/2013    ESOPHAGOGASTRODUODENOSCOPY  2003   [facility]   WISDOM TOOTH EXTRACTION      Current Medications: Current Meds  Medication Sig   clotrimazole-betamethasone (LOTRISONE) cream APPLY 1 APPLICATION 2 (TWO) TIMES DAILY TOPICALLY. (Patient taking differently: Apply 1 Application topically 2 (two) times daily.)   dapagliflozin propanediol (FARXIGA) 10 MG TABS tablet TAKE 1 TABLET DAILY   esomeprazole (NEXIUM) 40 MG capsule TAKE 1 CAPSULE DAILY (Patient taking differently: Take 40 mg by mouth daily at 12 noon.)   fenofibrate 160 MG tablet Take 1 tablet (160 mg total) by mouth daily.   glimepiride (AMARYL) 1 MG tablet TAKE 1/2 TABLET DAILY WITH BREAKFAST (Patient taking differently: Take 0.5 mg by mouth daily with breakfast.)   glucose blood (ONE TOUCH ULTRA TEST) test strip 1 each by Other route daily. E11.9   lisinopril (ZESTRIL) 20 MG tablet TAKE 1/2 TABLET DAILY   metFORMIN (GLUCOPHAGE-XR) 500 MG 24 hr tablet Take 1 tablet (500 mg total) by mouth 2 (two) times daily.   ONETOUCH DELICA LANCETS MISC 1 each by Does not apply route See admin instructions. Check blood sugar once daily   rosuvastatin (CRESTOR) 20 MG tablet TAKE 1 TABLET DAILY (Patient taking differently: Take 20 mg by mouth daily.)   tamsulosin (FLOMAX) 0.4 MG CAPS capsule Take 1 capsule (0.4 mg total) by mouth daily.     Allergies:   Pioglitazone  Social History   Socioeconomic History   Marital status: Married    Spouse name: Not on file   Number of children: Not on file   Years of education: Not on file   Highest education level: Not on file  Occupational History    Employer: [employer]  Tobacco Use   Smoking status: Every Day    Current packs/day: 3.00    Average packs/day: 3.0 packs/day for 47.3 years (142.0 ttl pk-yrs)    Types: Cigarettes    Start date: 06/12/1976   Smokeless tobacco: Never   Tobacco comments:    smoked 1977- present, up to 3 ppd  Vaping Use   Vaping status: Never Used  Substance and  Sexual Activity   Alcohol use: Yes    Alcohol/week: 2.0 standard drinks of alcohol    Types: 2 Cans of beer per week    Comment: Beer   Drug use: No   Sexual activity: Not on file  Other Topics Concern   Not on file  Social History Narrative   Not on file   Social Drivers of Health   Financial Resource Strain: Not on file  Food Insecurity: Low Risk  (06/18/2022)   [facility]   Hunger Vital Sign    Worried About Running Out of Food in the Last Year: Never true    Within the past 12 months, the food you bought just didn't last and you didn't have money to get more: Not on file  Transportation Needs: No Transportation Needs (06/18/2022)   [facility]   Transportation    In the past 12 months, has lack of reliable transportation kept you from medical appointments, meetings, work or from getting things needed for daily living? : No  Physical Activity: Not on file  Stress: Not on file  Social Connections: Not on file     Family History: The patient's family history includes Diabetes in his maternal grandfather, maternal grandmother, and mother; Heart attack (age of onset: 59) in his father; Melanoma in his sister; Stomach cancer in his maternal grandmother. There is no history of Stroke, Colon cancer, Esophageal cancer, Rectal cancer, or Colon polyps. ROS:   Please see the history of present illness.    All 14 point review of systems negative except as described per history of present illness.  EKGs/Labs/Other Studies Reviewed:    The following studies were reviewed today: Echocardiogram showed normal left ventricle ejection fraction, atherosclerosis of the aorta and mild calcification with aortic valve mean gradient 5 mmHg calculated area 2.14 cm.  Otherwise no significant valvular pathology  EKG:  EKG Interpretation Date/Time:  Thursday October 09 2023 09:41:46 EDT Ventricular Rate:  67 PR Interval:  158 QRS  Duration:  102 QT Interval:  400 QTC Calculation: 422 R Axis:   19  Text Interpretation: Normal sinus rhythm with sinus arrhythmia Incomplete right bundle branch block Borderline ECG When compared with ECG of 03-Oct-2005 20:52, Incomplete right bundle branch block is now Present Confirmed by Gypsy Balsam [phone]) on 10/09/2023 9:51:36 AM    Recent Labs: 09/11/2023: ALT 24; BUN 16; Creatinine, Ser 1.12; Hemoglobin 16.2; Platelets 176.0; Potassium 4.9; Sodium 137  Recent Lipid Panel    Component Value Date/Time   CHOL 126 09/11/2023 0906   TRIG 119.0 09/11/2023 0906   HDL 43.20 09/11/2023 0906   CHOLHDL 3 09/11/2023 0906   VLDL 23.8 09/11/2023 0906   LDLCALC 59 09/11/2023 0906   LDLDIRECT 129.0  04/29/2017 1053    Physical Exam:    VS:  BP 130/70 (BP Location: Right Arm, Patient Position: Sitting)   Pulse 67   Ht 5\' 7"  (1.702 m)   Wt 210 lb 9.6 oz (95.5 kg)   SpO2 97%   BMI 32.98 kg/m     Wt Readings from Last 3 Encounters:  10/09/23 210 lb 9.6 oz (95.5 kg)  09/11/23 211 lb (95.7 kg)  06/13/23 204 lb (92.5 kg)     GEN:  Well nourished, well developed in no acute distress HEENT: Normal NECK: No JVD; No carotid bruits LYMPHATICS: No lymphadenopathy CARDIAC: RRR, no murmurs, no rubs, no gallops RESPIRATORY:  Clear to auscultation without rales, wheezing or rhonchi  ABDOMEN: Soft, non-tender, non-distended MUSCULOSKELETAL:  No edema; No deformity  SKIN: Warm and dry NEUROLOGIC:  Alert and oriented x 3 PSYCHIATRIC:  Normal affect   ASSESSMENT:    1. Essential hypertension   2. Aortic atherosclerosis (HCC)   3. OBSTRUCTIVE SLEEP APNEA   4. Type 2 diabetes mellitus with stage 3a chronic kidney disease, without long-term current use of insulin (HCC)   5. Tobacco use    PLAN:    In order of problems listed above:  Multiple risk factors for coronary artery disease with coronary atherosclerosis of the aorta.  He does have dyspnea on exertion he is diabetic, worry  about him having angina equivalent.  Probably the best way to look into it will be to do coronary CT angio which I will schedule him to have.  In the meantime ask him to start taking 1 baby aspirin every single day. He is a smoker 63 years old I will schedule him to have abdominal ultrasounds to make sure he does not have any significant aneurysm in his abdominal aorta. He does have very soft bruit on the right side of his neck, carotic ultrasound will be scheduled. Dyslipidemia excellently managed by his primary care physician.  He is on Crestor 20 as well as fenofibrate which I will continue. Diabetes clearly needs attention hemoglobin A1c 9.5. Smoking I spent at least 10 minutes talking to him about this we discussed all different techniques help to quit he want me to give him Chantix he tried before seems to be helping but he stopped taking it so we will give him prescription 1 more time   Medication Adjustments/Labs and Tests Ordered: Current medicines are reviewed at length with the patient today.  Concerns regarding medicines are outlined above.  Orders Placed This Encounter  Procedures   EKG 12-Lead   No orders of the defined types were placed in this encounter.   Signed, Manfred Seed, MD, [facility]. 10/09/2023 10:15 AM    [facility]

## 2023-10-09 NOTE — Patient Instructions (Signed)
 Medication Instructions:   START: Aspirin 81mg  1 tablet daily  START: Chantix to use as directed   TAKE: Metoprolol 100mg  1 tablet 2 hours prior to CT Scan   Lab Work: None Ordered If you have labs (blood work) drawn today and your tests are completely normal, you will receive your results only by: [employer] (if you have MyChart) OR A paper copy in the mail If you have any lab test that is abnormal or we need to change your treatment, we will call you to review the results.   Testing/Procedures:  Your physician has requested that you have an abdominal aorta duplex. During this test, an ultrasound is used to evaluate the aorta. Allow 30 minutes for this exam. Do not eat after midnight the day before and avoid carbonated beverages.  Please note: We ask at that you not bring children with you during ultrasound (echo/ vascular) testing. Due to room size and safety concerns, children are not allowed in the ultrasound rooms during exams. Our front office staff cannot provide observation of children in our lobby area while testing is being conducted. An adult accompanying a patient to their appointment will only be allowed in the ultrasound room at the discretion of the ultrasound technician under special circumstances. We apologize for any inconvenience.   Your physician has requested that you have a carotid duplex. This test is an ultrasound of the carotid arteries in your neck. It looks at blood flow through these arteries that supply the brain with blood. Allow one hour for this exam. There are no restrictions or special instructions.   Your cardiac CT will be scheduled at one of the below locations:   [facility] [address] [place], [place] [address]  Please follow these instructions carefully (unless otherwise directed):  Hold all erectile dysfunction medications at least 3 days (72 hrs) prior to test.  On the Night Before the Test: Be sure to Drink plenty of  water. Do not consume any caffeinated/decaffeinated beverages or chocolate 12 hours prior to your test. Do not take any antihistamines 12 hours prior to your test.   On the Day of the Test: Drink plenty of water until 1 hour prior to the test. Do not eat any food 4 hours prior to the test. You may take your regular medications prior to the test.  Take metoprolol (Lopressor) two hours prior to test. HOLD Furosemide/Hydrochlorothiazide morning of the test. FEMALES- please wear underwire-free bra if available, avoid dresses & tight clothing       After the Test: Drink plenty of water. After receiving IV contrast, you may experience a mild flushed feeling. This is normal. On occasion, you may experience a mild rash up to 24 hours after the test. This is not dangerous. If this occurs, you can take Benadryl 25 mg and increase your fluid intake. If you experience trouble breathing, this can be serious. If it is severe call 911 IMMEDIATELY. If it is mild, please call our office. If you take any of these medications: Glipizide/Metformin, Avandament, Glucavance, please do not take 48 hours after completing test unless otherwise instructed.  We will call to schedule your test 2-4 weeks out understanding that some insurance companies will need an authorization prior to the service being performed.   For non-scheduling related questions, please contact the cardiac imaging nurse navigator should you have any questions/concerns: Zachary Clay, Cardiac Imaging Nurse Navigator Zachary Clay, Cardiac Imaging Nurse Navigator [facility] Direct  Office Dial: [phone]   For scheduling needs, including cancellations and rescheduling, please call Zachary Clay, [phone].    Follow-Up: At [facility], you and your health needs are our priority.  As part of our continuing mission to provide you with exceptional heart care, we have created designated Provider Care Teams.  These  Care Teams include your primary Cardiologist (physician) and Advanced Practice Providers (APPs -  Physician Assistants and Nurse Practitioners) who all work together to provide you with the care you need, when you need it.  We recommend signing up for the patient portal called "MyChart".  Sign up information is provided on this After Visit Summary.  MyChart is used to connect with patients for Virtual Visits (Telemedicine).  Patients are able to view lab/test results, encounter notes, upcoming appointments, etc.  Non-urgent messages can be sent to your provider as well.   To learn more about what you can do with MyChart, go to [email or web address].    Your next appointment:   2 month(s)  The format for your next appointment:   In Person  Provider:   Ralene Burger, MD    Other Instructions NA

## 2023-10-10 ENCOUNTER — Encounter (HOSPITAL_COMMUNITY): Payer: Self-pay

## 2023-10-10 ENCOUNTER — Other Ambulatory Visit: Payer: Self-pay | Admitting: Family Medicine

## 2023-10-10 DIAGNOSIS — E1169 Type 2 diabetes mellitus with other specified complication: Secondary | ICD-10-CM

## 2023-10-13 ENCOUNTER — Telehealth (HOSPITAL_COMMUNITY): Payer: Self-pay | Admitting: Emergency Medicine

## 2023-10-13 NOTE — Telephone Encounter (Signed)
 Reaching out to patient to offer assistance regarding upcoming cardiac imaging study; pt verbalizes understanding of appt date/time, parking situation and where to check in, pre-test NPO status and medications ordered, and verified current allergies; name and call back number provided for further questions should they arise Rockwell Alexandria RN Navigator Cardiac Imaging [facility] [phone] office [phone] cell

## 2023-10-14 ENCOUNTER — Ambulatory Visit (HOSPITAL_BASED_OUTPATIENT_CLINIC_OR_DEPARTMENT_OTHER)
Admission: RE | Admit: 2023-10-14 | Discharge: 2023-10-14 | Disposition: A | Discharge status: Home / Self Care | Source: Ambulatory Visit | Attending: Cardiology | Admitting: Cardiology

## 2023-10-14 ENCOUNTER — Encounter (HOSPITAL_BASED_OUTPATIENT_CLINIC_OR_DEPARTMENT_OTHER): Payer: Self-pay

## 2023-10-14 ENCOUNTER — Other Ambulatory Visit: Payer: Self-pay | Admitting: Cardiology

## 2023-10-14 DIAGNOSIS — R931 Abnormal findings on diagnostic imaging of heart and coronary circulation: Secondary | ICD-10-CM | POA: Diagnosis not present

## 2023-10-14 DIAGNOSIS — R072 Precordial pain: Secondary | ICD-10-CM | POA: Insufficient documentation

## 2023-10-14 MED ORDER — NITROGLYCERIN 0.4 MG SL SUBL
0.8000 mg | SUBLINGUAL_TABLET | Freq: Once | SUBLINGUAL | Status: AC
Start: 2023-10-14 — End: 2023-10-14
  Administered 2023-10-14: 0.8 mg via SUBLINGUAL

## 2023-10-14 MED ORDER — METOPROLOL TARTRATE 5 MG/5ML IV SOLN: 10.0000 mg | INTRAVENOUS | Status: DC | PRN

## 2023-10-14 MED ORDER — NITROGLYCERIN 0.4 MG SL SUBL
SUBLINGUAL_TABLET | SUBLINGUAL | Status: AC
  Filled 2023-10-14: qty 2

## 2023-10-14 MED ORDER — IOHEXOL 350 MG/ML SOLN
100.0000 mL | Freq: Once | INTRAVENOUS | Status: AC | PRN
  Administered 2023-10-14: 95 mL via INTRAVENOUS

## 2023-10-14 MED ORDER — DILTIAZEM HCL 25 MG/5ML IV SOLN: 10.0000 mg | INTRAVENOUS | Status: DC | PRN

## 2023-10-14 NOTE — Progress Notes (Unsigned)
 FFR order

## 2023-10-14 NOTE — Progress Notes (Signed)
 Pt tolerated exam without incident; vital signs within normal limits; pt denies lightheadedness or dizziness; encouraged to drink caffeine, eat meal; pt ambulatory to lobby steady gait noted

## 2023-10-16 ENCOUNTER — Telehealth: Payer: Self-pay

## 2023-10-16 ENCOUNTER — Telehealth: Payer: Self-pay | Admitting: Cardiology

## 2023-10-16 NOTE — Telephone Encounter (Signed)
 Spoke with daughter Alisa App, Per DPR. Advised per Dr. Tona Francis note that pt should be seen for follow up to discuss CT scan. Pt info sent to front desk for appt. Routed to PCP.

## 2023-10-16 NOTE — Telephone Encounter (Signed)
 Pt daughter calling for Ct results

## 2023-10-20 ENCOUNTER — Encounter: Payer: Self-pay | Admitting: Cardiology

## 2023-10-20 ENCOUNTER — Other Ambulatory Visit (HOSPITAL_BASED_OUTPATIENT_CLINIC_OR_DEPARTMENT_OTHER): Payer: Self-pay

## 2023-10-20 ENCOUNTER — Ambulatory Visit: Attending: Cardiology | Admitting: Cardiology

## 2023-10-20 VITALS — BP 120/70 | HR 63 | Ht 66.0 in | Wt 210.4 lb

## 2023-10-20 DIAGNOSIS — G4733 Obstructive sleep apnea (adult) (pediatric): Secondary | ICD-10-CM | POA: Diagnosis not present

## 2023-10-20 DIAGNOSIS — I25709 Atherosclerosis of coronary artery bypass graft(s), unspecified, with unspecified angina pectoris: Secondary | ICD-10-CM

## 2023-10-20 DIAGNOSIS — I2510 Atherosclerotic heart disease of native coronary artery without angina pectoris: Secondary | ICD-10-CM | POA: Insufficient documentation

## 2023-10-20 MED ORDER — ISOSORBIDE MONONITRATE ER 30 MG PO TB24
30.0000 mg | ORAL_TABLET | Freq: Every day | ORAL | 3 refills | Status: DC
  Filled 2023-10-20: qty 30, 30d supply, fill #0
  Filled 2023-12-09: qty 30, 30d supply, fill #1

## 2023-10-20 MED ORDER — ISOSORBIDE MONONITRATE ER 30 MG PO TB24: 30.0000 mg | ORAL_TABLET | Freq: Every day | ORAL | 3 refills | Status: DC

## 2023-10-20 NOTE — Progress Notes (Signed)
 Cardiology Office Note:    Date:  10/20/2023   ID:  Zachary Beckers., DOB Sep 12, 1960, MRN [id number]  PCP:  Odette Benjamin, NP  Cardiologist:  Ralene Burger, MD    Referring MD: Odette Benjamin, NP   Chief Complaint  Patient presents with   Results    History of Present Illness:    Zachary Erkkila. is a 63 y.o. male past medical history significant for smoking, essential hypertension, diabetes for many years poorly controlled, dyslipidemia.  Comes today to months to discuss results of his coronary CT angio.  Coronary CT angio showed severe stenosis of the distal LAD.  Likely he does not have typical symptoms.  But I still think there is some value for antianginal therapy.  Will schedule him to start taking Imdur 30 mg daily  Past Medical History:  Diagnosis Date   Diabetes mellitus    GERD (gastroesophageal reflux disease)    Hyperlipidemia    Hypertension    Personal history of colonic adenomas 02/16/2013   Polycythemia 06/24/2010   Hgb 17.8   Sleep apnea     Past Surgical History:  Procedure Laterality Date   APPENDECTOMY     COLONOSCOPY  02/08/2013   ESOPHAGOGASTRODUODENOSCOPY  2003   [facility]   WISDOM TOOTH EXTRACTION      Current Medications: Current Meds  Medication Sig   aspirin  EC 81 MG tablet Take 1 tablet (81 mg total) by mouth daily. Swallow whole.   clotrimazole -betamethasone  (LOTRISONE ) cream APPLY 1 APPLICATION 2 (TWO) TIMES DAILY TOPICALLY. (Patient taking differently: Apply 1 Application topically 2 (two) times daily.)   dapagliflozin  propanediol (FARXIGA ) 10 MG TABS tablet TAKE 1 TABLET DAILY   esomeprazole  (NEXIUM ) 40 MG capsule TAKE 1 CAPSULE DAILY   fenofibrate  160 MG tablet Take 1 tablet (160 mg total) by mouth daily.   glimepiride  (AMARYL ) 1 MG tablet TAKE 1/2 TABLET DAILY WITH BREAKFAST (Patient taking differently: Take 0.5 mg by mouth daily with breakfast.)   glucose blood (ONE TOUCH ULTRA TEST) test strip 1 each by Other route daily.  E11.9   lisinopril  (ZESTRIL ) 20 MG tablet TAKE 1/2 TABLET DAILY   metFORMIN  (GLUCOPHAGE -XR) 500 MG 24 hr tablet Take 1 tablet (500 mg total) by mouth 2 (two) times daily.   ONETOUCH DELICA LANCETS MISC 1 each by Does not apply route See admin instructions. Check blood sugar once daily   rosuvastatin  (CRESTOR ) 20 MG tablet TAKE 1 TABLET DAILY   tamsulosin  (FLOMAX ) 0.4 MG CAPS capsule Take 1 capsule (0.4 mg total) by mouth daily.   Varenicline  Tartrate, Starter, (CHANTIX  STARTING MONTH PAK) 0.5 MG X 11 & 1 MG X 42 TBPK Take 1 tablet by mouth as directed.   [DISCONTINUED] metoprolol  tartrate (LOPRESSOR ) 100 MG tablet Take one tablet 2 hours before cardiac CT for heart greater than 55     Allergies:   Pioglitazone   Social History   Socioeconomic History   Marital status: Married    Spouse name: Not on file   Number of children: Not on file   Years of education: Not on file   Highest education level: Not on file  Occupational History    Employer: [employer]  Tobacco Use   Smoking status: Every Day    Current packs/day: 3.00    Average packs/day: 3.0 packs/day for 47.4 years (142.1 ttl pk-yrs)    Types: Cigarettes    Start date: 06/12/1976   Smokeless tobacco: Never   Tobacco comments:  smoked 1977- present, up to 3 ppd  Vaping Use   Vaping status: Never Used  Substance and Sexual Activity   Alcohol use: Yes    Alcohol/week: 2.0 standard drinks of alcohol    Types: 2 Cans of beer per week    Comment: Beer   Drug use: No   Sexual activity: Not on file  Other Topics Concern   Not on file  Social History Narrative   Not on file   Social Drivers of Health   Financial Resource Strain: Not on file  Food Insecurity: Low Risk  (06/18/2022)   [facility]   Hunger Vital Sign    Worried About Running Out of Food in the Last Year: Never true    Within the past 12 months, the food you bought just didn't last and you didn't have money to get  more: Not on file  Transportation Needs: No Transportation Needs (06/18/2022)   [facility]   Transportation    In the past 12 months, has lack of reliable transportation kept you from medical appointments, meetings, work or from getting things needed for daily living? : No  Physical Activity: Not on file  Stress: Not on file  Social Connections: Not on file     Family History: The patient's family history includes Diabetes in his maternal grandfather, maternal grandmother, and mother; Heart attack (age of onset: 49) in his father; Melanoma in his sister; Stomach cancer in his maternal grandmother. There is no history of Stroke, Colon cancer, Esophageal cancer, Rectal cancer, or Colon polyps. ROS:   Please see the history of present illness.    All 14 point review of systems negative except as described per history of present illness  EKGs/Labs/Other Studies Reviewed:         Recent Labs: 09/11/2023: ALT 24; BUN 16; Creatinine, Ser 1.12; Hemoglobin 16.2; Platelets 176.0; Potassium 4.9; Sodium 137  Recent Lipid Panel    Component Value Date/Time   CHOL 126 09/11/2023 0906   TRIG 119.0 09/11/2023 0906   HDL 43.20 09/11/2023 0906   CHOLHDL 3 09/11/2023 0906   VLDL 23.8 09/11/2023 0906   LDLCALC 59 09/11/2023 0906   LDLDIRECT 129.0 04/29/2017 1053    Physical Exam:    VS:  BP 120/70 (BP Location: Right Arm, Patient Position: Sitting)   Pulse 63   Ht 5\' 6"  (1.676 m)   Wt 210 lb 6.4 oz (95.4 kg)   SpO2 90%   BMI 33.96 kg/m     Wt Readings from Last 3 Encounters:  10/20/23 210 lb 6.4 oz (95.4 kg)  10/09/23 210 lb 9.6 oz (95.5 kg)  09/11/23 211 lb (95.7 kg)     GEN:  Well nourished, well developed in no acute distress HEENT: Normal NECK: No JVD; No carotid bruits LYMPHATICS: No lymphadenopathy CARDIAC: RRR, no murmurs, no rubs, no gallops RESPIRATORY:  Clear to auscultation without rales, wheezing or rhonchi  ABDOMEN: Soft, non-tender,  non-distended MUSCULOSKELETAL:  No edema; No deformity  SKIN: Warm and dry LOWER EXTREMITIES: no swelling NEUROLOGIC:  Alert and oriented x 3 PSYCHIATRIC:  Normal affect   ASSESSMENT:    1. Coronary artery disease involving coronary bypass graft of native heart with angina pectoris (HCC) severe stenosis of distal LAD based on coronary CT angio   2. OBSTRUCTIVE SLEEP APNEA    PLAN:    In order of problems listed above:  Coronary disease distal LAD seems to have significant stenosis  but he does not have any typical symptoms we will put him on Imdur will be difficult to reach far down the road to the coronary artery therefore medical therapy for now. Dyslipidemia I did review K PN which show me his LDL 59 HDL 43 good control we will continue present management. Essential hypertension blood pressure well-controlled. Smoking he quit smoking today this is the first day he stopped smoking congratulated him and encouraged him strongly to stay away from smoking Advanced atherosclerosis we are waiting for carotic ultrasounds as well as aortic ultrasound   Medication Adjustments/Labs and Tests Ordered: Current medicines are reviewed at length with the patient today.  Concerns regarding medicines are outlined above.  No orders of the defined types were placed in this encounter.  Medication changes: No orders of the defined types were placed in this encounter.   Signed, Manfred Seed, MD, [facility] 10/20/2023 3:52 PM    [facility]

## 2023-10-20 NOTE — Patient Instructions (Signed)
Medication Instructions:   START: Imdur 30mg 1 tablet daily   Lab Work: None Ordered If you have labs (blood work) drawn today and your tests are completely normal, you will receive your results only by: MyChart Message (if you have MyChart) OR A paper copy in the mail If you have any lab test that is abnormal or we need to change your treatment, we will call you to review the results.   Testing/Procedures: None Ordered   Follow-Up: At [facility], you and your health needs are our priority.  As part of our continuing mission to provide you with exceptional heart care, we have created designated Provider Care Teams.  These Care Teams include your primary Cardiologist (physician) and Advanced Practice Providers (APPs -  Physician Assistants and Nurse Practitioners) who all work together to provide you with the care you need, when you need it.  We recommend signing up for the patient portal called "MyChart".  Sign up information is provided on this After Visit Summary.  MyChart is used to connect with patients for Virtual Visits (Telemedicine).  Patients are able to view lab/test results, encounter notes, upcoming appointments, etc.  Non-urgent messages can be sent to your provider as well.   To learn more about what you can do with MyChart, go to [email or web address].    Your next appointment:   3 month(s)  The format for your next appointment:   In Person  Provider:   Robert Krasowski, MD    Other Instructions NA  

## 2023-10-22 ENCOUNTER — Other Ambulatory Visit: Payer: Self-pay | Admitting: Internal Medicine

## 2023-10-22 DIAGNOSIS — E1165 Type 2 diabetes mellitus with hyperglycemia: Secondary | ICD-10-CM

## 2023-10-22 DIAGNOSIS — E1169 Type 2 diabetes mellitus with other specified complication: Secondary | ICD-10-CM

## 2023-11-04 ENCOUNTER — Ambulatory Visit: Payer: Self-pay

## 2023-11-04 NOTE — Telephone Encounter (Signed)
 LM to return my call.

## 2023-11-04 NOTE — Telephone Encounter (Signed)
-----   Message from Ralene Burger sent at 10/30/2023  5:00 PM EDT ----- Moderate CAD, extracardiac structures intact

## 2023-11-05 NOTE — Telephone Encounter (Signed)
 Patient notified of results and verbalized understanding.

## 2023-11-05 NOTE — Telephone Encounter (Signed)
-----   Message from Ralene Burger sent at 10/30/2023  5:00 PM EDT ----- Moderate CAD, extracardiac structures intact

## 2023-11-09 ENCOUNTER — Other Ambulatory Visit: Payer: Self-pay | Admitting: Internal Medicine

## 2023-11-10 ENCOUNTER — Telehealth: Payer: Self-pay | Admitting: Cardiology

## 2023-11-10 NOTE — Telephone Encounter (Signed)
 Patient is needing a refill on his generic Chantix . CB# [phone]

## 2023-11-11 ENCOUNTER — Other Ambulatory Visit (HOSPITAL_BASED_OUTPATIENT_CLINIC_OR_DEPARTMENT_OTHER): Payer: Self-pay

## 2023-11-11 ENCOUNTER — Ambulatory Visit: Attending: Cardiology

## 2023-11-11 ENCOUNTER — Other Ambulatory Visit: Payer: Self-pay

## 2023-11-11 ENCOUNTER — Telehealth: Payer: Self-pay | Admitting: Cardiology

## 2023-11-11 ENCOUNTER — Ambulatory Visit (INDEPENDENT_AMBULATORY_CARE_PROVIDER_SITE_OTHER)

## 2023-11-11 DIAGNOSIS — R0989 Other specified symptoms and signs involving the circulatory and respiratory systems: Secondary | ICD-10-CM

## 2023-11-11 DIAGNOSIS — I714 Abdominal aortic aneurysm, without rupture, unspecified: Secondary | ICD-10-CM | POA: Diagnosis not present

## 2023-11-11 MED ORDER — VARENICLINE TARTRATE(CONTINUE) 1 MG PO TABS
1.0000 mg | ORAL_TABLET | Freq: Two times a day (BID) | ORAL | 0 refills | Status: DC
  Filled 2023-11-11: qty 56, 28d supply, fill #0

## 2023-11-11 NOTE — Telephone Encounter (Signed)
 Patient needs refill on Chantix  sent to [facility] pharmacy.

## 2023-11-11 NOTE — Telephone Encounter (Signed)
 Spoke with Prentice Brochure at [facility] she just needed ok to continue with Chantix  Starter pack 1mg  - Ok'd per Dr. Gordan Latina.

## 2023-12-09 ENCOUNTER — Other Ambulatory Visit (HOSPITAL_BASED_OUTPATIENT_CLINIC_OR_DEPARTMENT_OTHER): Payer: Self-pay

## 2023-12-09 ENCOUNTER — Other Ambulatory Visit: Payer: Self-pay | Admitting: Cardiology

## 2023-12-12 NOTE — Telephone Encounter (Signed)
 Pt's pharmacy is requesting a refill on non cardiac medication varenicline  tartrate. Would Dr. Krasowski like to refill this non cardiac medication? Please address

## 2023-12-19 ENCOUNTER — Other Ambulatory Visit (HOSPITAL_BASED_OUTPATIENT_CLINIC_OR_DEPARTMENT_OTHER): Payer: Self-pay

## 2023-12-20 ENCOUNTER — Other Ambulatory Visit: Payer: Self-pay | Admitting: Family Medicine

## 2023-12-20 ENCOUNTER — Other Ambulatory Visit: Payer: Self-pay | Admitting: Internal Medicine

## 2023-12-20 DIAGNOSIS — E1165 Type 2 diabetes mellitus with hyperglycemia: Secondary | ICD-10-CM

## 2023-12-30 ENCOUNTER — Other Ambulatory Visit: Payer: Self-pay | Admitting: Family Medicine

## 2023-12-30 ENCOUNTER — Ambulatory Visit: Admitting: Cardiology

## 2024-01-01 ENCOUNTER — Telehealth: Payer: Self-pay

## 2024-01-01 NOTE — Telephone Encounter (Signed)
 Copied from CRM [id number]. Topic: General - Other >> Jan 01, 2024  9:46 AM Lavanda D wrote: Reason for CRM: Patient called because his device is no longer working, CPAP machine is broken.

## 2024-01-01 NOTE — Telephone Encounter (Signed)
left message for patient to return call

## 2024-01-09 NOTE — Telephone Encounter (Signed)
 I called and spoke with patient and he has tried to contact the medical supply store and his insurance company and is getting no where and questions what can he do.

## 2024-01-12 NOTE — Telephone Encounter (Signed)
 I called and spoke with patient and notified him of below message and he will contact Dr. Neysa.

## 2024-01-14 DIAGNOSIS — G4733 Obstructive sleep apnea (adult) (pediatric): Secondary | ICD-10-CM | POA: Diagnosis not present

## 2024-01-19 ENCOUNTER — Encounter: Payer: Self-pay | Admitting: Cardiology

## 2024-01-21 ENCOUNTER — Ambulatory Visit: Attending: Cardiology | Admitting: Cardiology

## 2024-01-21 VITALS — BP 122/70 | HR 78 | Ht 67.0 in | Wt 213.4 lb

## 2024-01-21 DIAGNOSIS — I2510 Atherosclerotic heart disease of native coronary artery without angina pectoris: Secondary | ICD-10-CM | POA: Diagnosis not present

## 2024-01-21 DIAGNOSIS — E1169 Type 2 diabetes mellitus with other specified complication: Secondary | ICD-10-CM | POA: Diagnosis not present

## 2024-01-21 DIAGNOSIS — I10 Essential (primary) hypertension: Secondary | ICD-10-CM

## 2024-01-21 DIAGNOSIS — E785 Hyperlipidemia, unspecified: Secondary | ICD-10-CM

## 2024-01-21 DIAGNOSIS — Z720 Tobacco use: Secondary | ICD-10-CM

## 2024-01-21 NOTE — Patient Instructions (Signed)

## 2024-01-21 NOTE — Progress Notes (Unsigned)
 Cardiology Office Note:    Date:  01/21/2024   ID:  Zachary CHRISTELLA Ronnald Mickey., DOB [birth year], MRN [id number]  PCP:  Nedra Tinnie LABOR, NP  Cardiologist:  Lamar Fitch, MD    Referring MD: Nedra Tinnie LABOR, NP   Chief Complaint  Patient presents with   Follow-up    History of Present Illness:    Echo [place]. is a 63 y.o. male past medical history significant for coronary artery disease, he did have coronary CT angio which showed severe stenosis of distal LAD likely he got minimal symptoms additional problem include diabetes hypertension smoking which likely he quit and dyslipidemia.  Comes today to my office overall doing great.  Asymptomatic does what he wants to do he quit smoking April still abstaining from smoking which I congratulated him for encouraged him to stay away from it.  Past Medical History:  Diagnosis Date   Diabetes mellitus    GERD (gastroesophageal reflux disease)    Hyperlipidemia    Hypertension    Personal history of colonic adenomas 02/16/2013   Polycythemia 06/24/2010   Hgb 17.8   Sleep apnea     Past Surgical History:  Procedure Laterality Date   APPENDECTOMY     COLONOSCOPY  02/08/2013   ESOPHAGOGASTRODUODENOSCOPY  2003   [facility]   WISDOM TOOTH EXTRACTION      Current Medications: Current Meds  Medication Sig   aspirin  EC 81 MG tablet Take 1 tablet (81 mg total) by mouth daily. Swallow whole.   clotrimazole -betamethasone  (LOTRISONE ) cream APPLY 1 APPLICATION 2 (TWO) TIMES DAILY TOPICALLY. (Patient taking differently: Apply 1 Application topically 2 (two) times daily.)   dapagliflozin  propanediol (FARXIGA ) 10 MG TABS tablet TAKE 1 TABLET DAILY   esomeprazole  (NEXIUM ) 40 MG capsule TAKE 1 CAPSULE DAILY   fenofibrate  160 MG tablet Take 1 tablet (160 mg total) by mouth daily.   glimepiride  (AMARYL ) 1 MG tablet TAKE 1/2 TABLET DAILY WITH BREAKFAST (Patient taking differently: Take 0.5 mg by mouth daily with breakfast.)   glucose blood (ONE  TOUCH ULTRA TEST) test strip 1 each by Other route daily. E11.9   isosorbide  mononitrate (IMDUR ) 30 MG 24 hr tablet Take 1 tablet (30 mg total) by mouth daily.   isosorbide  mononitrate (IMDUR ) 30 MG 24 hr tablet Take 1 tablet (30 mg total) by mouth daily.   lisinopril  (ZESTRIL ) 20 MG tablet TAKE 1/2 TABLET DAILY   metFORMIN  (GLUCOPHAGE -XR) 500 MG 24 hr tablet Take 1 tablet (500 mg total) by mouth 2 (two) times daily.   ONETOUCH DELICA LANCETS MISC 1 each by Does not apply route See admin instructions. Check blood sugar once daily   OZEMPIC , 1 MG/DOSE, 4 MG/3ML SOPN INJECT 1MG  SUBCUTANEOUSLY  ONCE WEEKLY (EVERY 7 DAYS) AS DIRECTED   rosuvastatin  (CRESTOR ) 20 MG tablet TAKE 1 TABLET DAILY   tamsulosin  (FLOMAX ) 0.4 MG CAPS capsule Take 1 capsule (0.4 mg total) by mouth daily.   Varenicline  Tartrate, Starter, (CHANTIX  STARTING MONTH PAK) 0.5 MG X 11 & 1 MG X 42 TBPK Take 1 tablet by mouth as directed.   Varenicline  Tartrate,Continue, 1 MG TABS Take 1 mg by mouth 2 (two) times daily.     Allergies:   Pioglitazone   Social History   Socioeconomic History   Marital status: Married    Spouse name: Not on file   Number of children: Not on file   Years of education: Not on file   Highest education level: Not on file  Occupational History  Employer: [employer]  Tobacco Use   Smoking status: Every Day    Current packs/day: 3.00    Average packs/day: 3.0 packs/day for 47.6 years (142.8 ttl pk-yrs)    Types: Cigarettes    Start date: 06/12/1976   Smokeless tobacco: Never   Tobacco comments:    smoked 1977- present, up to 3 ppd  Vaping Use   Vaping status: Never Used  Substance and Sexual Activity   Alcohol use: Yes    Alcohol/week: 2.0 standard drinks of alcohol    Types: 2 Cans of beer per week    Comment: Beer   Drug use: No   Sexual activity: Not on file  Other Topics Concern   Not on file  Social History Narrative   Not on file   Social Drivers of Health   Financial  Resource Strain: Not on file  Food Insecurity: Low Risk  (06/18/2022)   Received from [facility]   Hunger Vital Sign    Within the past 12 months, you worried that your food would run out before you got money to buy more: Never true    Within the past 12 months, the food you bought just didn't last and you didn't have money to get more: Not on file  Transportation Needs: No Transportation Needs (06/18/2022)   Received from [facility]    In the past 12 months, has lack of reliable transportation kept you from medical appointments, meetings, work or from getting things needed for daily living? : No  Physical Activity: Not on file  Stress: Not on file  Social Connections: Not on file     Family History: The patient's family history includes Diabetes in his maternal grandfather, maternal grandmother, and mother; Heart attack (age of onset: 36) in his father; Melanoma in his sister; Stomach cancer in his maternal grandmother. There is no history of Stroke, Colon cancer, Esophageal cancer, Rectal cancer, or Colon polyps. ROS:   Please see the history of present illness.    All 14 point review of systems negative except as described per history of present illness  EKGs/Labs/Other Studies Reviewed:         Recent Labs: 09/11/2023: ALT 24; BUN 16; Creatinine, Ser 1.12; Hemoglobin 16.2; Platelets 176.0; Potassium 4.9; Sodium 137  Recent Lipid Panel    Component Value Date/Time   CHOL 126 09/11/2023 0906   TRIG 119.0 09/11/2023 0906   HDL 43.20 09/11/2023 0906   CHOLHDL 3 09/11/2023 0906   VLDL 23.8 09/11/2023 0906   LDLCALC 59 09/11/2023 0906   LDLDIRECT 129.0 04/29/2017 1053    Physical Exam:    VS:  BP 122/70 (BP Location: Left Arm, Patient Position: Sitting)   Pulse 78   Ht 5' 7 (1.702 m)   Wt 213 lb 6.4 oz (96.8 kg)   SpO2 92%   BMI 33.42 kg/m     Wt Readings from Last 3 Encounters:  01/21/24 213 lb 6.4 oz (96.8 kg)  10/20/23 210 lb 6.4 oz (95.4  kg)  10/09/23 210 lb 9.6 oz (95.5 kg)     GEN:  Well nourished, well developed in no acute distress HEENT: Normal NECK: No JVD; No carotid bruits LYMPHATICS: No lymphadenopathy CARDIAC: RRR, no murmurs, no rubs, no gallops RESPIRATORY:  Clear to auscultation without rales, wheezing or rhonchi  ABDOMEN: Soft, non-tender, non-distended MUSCULOSKELETAL:  No edema; No deformity  SKIN: Warm and dry LOWER EXTREMITIES: no swelling NEUROLOGIC:  Alert and oriented x 3 PSYCHIATRIC:  Normal affect   ASSESSMENT:    1. Coronary artery disease involving native coronary artery of native heart without angina pectoris   2. Essential hypertension   3. Hyperlipidemia associated with type 2 diabetes mellitus (HCC)   4. Tobacco use    PLAN:    In order of problems listed above:  Coronary disease with distal LAD.  He is asymptomatic on guideline directed medical therapy which I will continue. Dyslipidemia I did review K PN which show me LDL 59 HDL 43 this is from 09/11/2023. Diabetes mellitus followed by antimedicine team last hemoglobin A1c is 9.5 clearly not well-controlled I spent some time talking about this strongly encouraged him to take care of his diabetes better. Tobacco abuse again he quit in April and I congratulated him encouraged him to stay away from it   Medication Adjustments/Labs and Tests Ordered: Current medicines are reviewed at length with the patient today.  Concerns regarding medicines are outlined above.  No orders of the defined types were placed in this encounter.  Medication changes: No orders of the defined types were placed in this encounter.   Signed, Lamar DOROTHA Fitch, MD, [facility] 01/21/2024 9:28 AM    [facility]

## 2024-02-01 ENCOUNTER — Other Ambulatory Visit (HOSPITAL_BASED_OUTPATIENT_CLINIC_OR_DEPARTMENT_OTHER): Payer: Self-pay

## 2024-02-03 ENCOUNTER — Ambulatory Visit

## 2024-02-03 NOTE — Progress Notes (Deleted)
 @Patient  ID: Zachary Clay., male    DOB: [birth year], 63 y.o.   MRN: [id number]  No chief complaint on file.   Referring provider: Nedra Tinnie LABOR, NP  HPI: Zachary EMERSON Ronnald Clay. Is a 63 y/o male with PMH of OSA, asthma, COPD, HTN, CAD and DM who presents today for sleep.  He was last seen in our office in July of 2022.    TEST/EVENTS : NPSG 05/10/97 ([facility]) AHI 88.7/ hr, desaturation to 57%  Allergies  Allergen Reactions   Pioglitazone     REACTION: edema    Immunization History  Administered Date(s) Administered   Influenza-Unspecified 04/24/2018   Moderna Sars-Covid-2 Vaccination 09/02/2019, 09/28/2019   Tdap 06/24/2008, 12/16/2017   Zoster Recombinant(Shingrix) 08/31/2020, 04/25/2021    Past Medical History:  Diagnosis Date   Diabetes mellitus    GERD (gastroesophageal reflux disease)    Hyperlipidemia    Hypertension    Personal history of colonic adenomas 02/16/2013   Polycythemia 06/24/2010   Hgb 17.8   Sleep apnea     Tobacco History: Social History   Tobacco Use  Smoking Status Every Day   Current packs/day: 3.00   Average packs/day: 3.0 packs/day for 47.6 years (142.9 ttl pk-yrs)   Types: Cigarettes   Start date: 06/12/1976  Smokeless Tobacco Never  Tobacco Comments   smoked 1977- present, up to 3 ppd   Ready to quit: Not Answered Counseling given: Not Answered Tobacco comments: smoked 1977- present, up to 3 ppd   Outpatient Medications Prior to Visit  Medication Sig Dispense Refill   aspirin  EC 81 MG tablet Take 1 tablet (81 mg total) by mouth daily. Swallow whole.     clotrimazole -betamethasone  (LOTRISONE ) cream APPLY 1 APPLICATION 2 (TWO) TIMES DAILY TOPICALLY. (Patient taking differently: Apply 1 Application topically 2 (two) times daily.) 135 g 1   dapagliflozin  propanediol (FARXIGA ) 10 MG TABS tablet TAKE 1 TABLET DAILY 30 tablet 5   esomeprazole  (NEXIUM ) 40 MG capsule TAKE 1 CAPSULE DAILY 90 capsule 1   fenofibrate  160  MG tablet Take 1 tablet (160 mg total) by mouth daily. 90 tablet 1   glimepiride  (AMARYL ) 1 MG tablet TAKE 1/2 TABLET DAILY WITH BREAKFAST (Patient taking differently: Take 0.5 mg by mouth daily with breakfast.) 45 tablet 3   glucose blood (ONE TOUCH ULTRA TEST) test strip 1 each by Other route daily. E11.9 100 each 0   isosorbide  mononitrate (IMDUR ) 30 MG 24 hr tablet Take 1 tablet (30 mg total) by mouth daily. 30 tablet 3   isosorbide  mononitrate (IMDUR ) 30 MG 24 hr tablet Take 1 tablet (30 mg total) by mouth daily. 90 tablet 3   lisinopril  (ZESTRIL ) 20 MG tablet TAKE 1/2 TABLET DAILY 45 tablet 1   metFORMIN  (GLUCOPHAGE -XR) 500 MG 24 hr tablet Take 1 tablet (500 mg total) by mouth 2 (two) times daily. 180 tablet 3   ONETOUCH DELICA LANCETS MISC 1 each by Does not apply route See admin instructions. Check blood sugar once daily     OZEMPIC , 1 MG/DOSE, 4 MG/3ML SOPN INJECT 1MG  SUBCUTANEOUSLY  ONCE WEEKLY (EVERY 7 DAYS) AS DIRECTED 9 mL 3   rosuvastatin  (CRESTOR ) 20 MG tablet TAKE 1 TABLET DAILY 90 tablet 1   tamsulosin  (FLOMAX ) 0.4 MG CAPS capsule Take 1 capsule (0.4 mg total) by mouth daily. 90 capsule 1   Varenicline  Tartrate, Starter, (CHANTIX  STARTING MONTH PAK) 0.5 MG X 11 & 1 MG X 42 TBPK Take 1 tablet by mouth  as directed. 53 each 0   Varenicline  Tartrate,Continue, 1 MG TABS Take 1 mg by mouth 2 (two) times daily. 56 tablet 0   No facility-administered medications prior to visit.     Review of Systems:   Constitutional:   No  weight loss, night sweats,  Fevers, chills, fatigue, or  lassitude.  HEENT:   No headaches,  Difficulty swallowing,  Tooth/dental problems, or  Sore throat,                No sneezing, itching, ear ache, nasal congestion, post nasal drip,   CV:  No chest pain,  Orthopnea, PND, swelling in lower extremities, anasarca, dizziness, palpitations, syncope.   GI  No heartburn, indigestion, abdominal pain, nausea, vomiting, diarrhea, change in bowel habits, loss of  appetite, bloody stools.   Resp: No shortness of breath with exertion or at rest.  No excess mucus, no productive cough,  No non-productive cough,  No coughing up of blood.  No change in color of mucus.  No wheezing.  No chest wall deformity  Skin: no rash or lesions.  GU: no dysuria, change in color of urine, no urgency or frequency.  No flank pain, no hematuria   MS:  No joint pain or swelling.  No decreased range of motion.  No back pain.    Physical Exam  There were no vitals taken for this visit.  GEN: A/Ox3; pleasant , NAD, well nourished    HEENT:  [place]/AT,  EACs-clear, TMs-wnl, NOSE-clear, THROAT-clear, no lesions, no postnasal drip or exudate noted.   NECK:  Supple w/ fair ROM; no JVD; normal carotid impulses w/o bruits; no thyromegaly or nodules palpated; no lymphadenopathy.    RESP  Clear  P & A; w/o, wheezes/ rales/ or rhonchi. no accessory muscle use, no dullness to percussion  CARD:  RRR, no m/r/g, no peripheral edema, pulses intact, no cyanosis or clubbing.  GI:   Soft & nt; nml bowel sounds; no organomegaly or masses detected.   Musco: Warm bil, no deformities or joint swelling noted.   Neuro: alert, no focal deficits noted.    Skin: Warm, no lesions or rashes    Lab Results:  CBC    Component Value Date/Time   WBC 6.1 09/11/2023 0906   RBC 5.43 09/11/2023 0906   HGB 16.2 09/11/2023 0906   HCT 48.1 09/11/2023 0906   PLT 176.0 09/11/2023 0906   MCV 88.5 09/11/2023 0906   MCHC 33.8 09/11/2023 0906   RDW 14.1 09/11/2023 0906   LYMPHSABS 1.5 09/11/2023 0906   MONOABS 0.5 09/11/2023 0906   EOSABS 0.3 09/11/2023 0906   BASOSABS 0.1 09/11/2023 0906    BMET    Component Value Date/Time   NA 137 09/11/2023 0906   K 4.9 09/11/2023 0906   CL 100 09/11/2023 0906   CO2 29 09/11/2023 0906   GLUCOSE 151 (H) 09/11/2023 0906   BUN 16 09/11/2023 0906   CREATININE 1.12 09/11/2023 0906   CREATININE 1.17 05/06/2014 0735   CALCIUM  9.5 09/11/2023 0906    GFRNONAA 86 01/05/2007 0000   GFRAA 103 01/05/2007 0000    BNP No results found for: BNP  ProBNP No results found for: PROBNP  Imaging: No results found.  Administration History     None           No data to display          No results found for: NITRICOXIDE   Assessment & Plan:   Assessment & Plan  No follow-ups on file.  Candis Dandy, PA-C 02/03/2024

## 2024-02-06 DIAGNOSIS — S0501XA Injury of conjunctiva and corneal abrasion without foreign body, right eye, initial encounter: Secondary | ICD-10-CM | POA: Diagnosis not present

## 2024-02-09 ENCOUNTER — Ambulatory Visit

## 2024-02-09 ENCOUNTER — Telehealth (HOSPITAL_BASED_OUTPATIENT_CLINIC_OR_DEPARTMENT_OTHER): Payer: Self-pay

## 2024-02-09 VITALS — BP 118/60 | HR 73 | Temp 98.3°F | Ht 67.0 in | Wt 222.0 lb

## 2024-02-09 DIAGNOSIS — G4733 Obstructive sleep apnea (adult) (pediatric): Secondary | ICD-10-CM

## 2024-02-09 DIAGNOSIS — J45909 Unspecified asthma, uncomplicated: Secondary | ICD-10-CM | POA: Diagnosis not present

## 2024-02-09 DIAGNOSIS — Z720 Tobacco use: Secondary | ICD-10-CM

## 2024-02-09 NOTE — Assessment & Plan Note (Signed)
-   Self-reported; no PFTs available for review. - Patient reports he does not have any issues with dyspnea or cough - PFTs previously ordered but not completed.  Would recommend completing PFTs however patient refuses presently.

## 2024-02-09 NOTE — Telephone Encounter (Unsigned)
 Copied from CRM #[id number]. Topic: Clinical - Order For Equipment >> Feb 09, 2024  4:01 PM Celestine FALCON wrote: Reason for CRM: Pt stated he had an appt today with PA-C Candis Dandy regarding his cpap machine and supplies. He was told to call back and relay to CMA Amy which supplier he wanted to go through for the CPAP and supplies. Pt stated he would prefer [facility].   Pt's phone number is [phone] ok to leave a vm.

## 2024-02-09 NOTE — Progress Notes (Signed)
 @Patient  ID: Zachary CHRISTELLA Ronnald Mickey., male    DOB: 04/21/1961, 62 y.o.   MRN: [id number]  Chief Complaint  Patient presents with   Sleep Apnea    Needs a new CPAP.  Using an old CPAP.  Current CPAP is broken.      Referring provider: Nedra Tinnie LABOR, NP  HPI: Zachary EMERSON Ronnald Mickey. Is a 63 y/o male with PMH of OSA on CPAP, HTN, DM2, GERD, and obesity who presents today for evaluation of sleep.  He was last seen in our clinic in July of 2022. He reports that after that appointment he got a new machine, but it has now stopped working.  He has returned to using his old machine; compliance download is not available for review.   He states that he uses it nightly and is unable to sleep without it.  He reports daily perceived benefit from using the machine.  He states, If I don't use it, I feel like I have a massive hangover the next day.  He also reports that he stopped smoking on 10/20/2023 with the aid of Chantix .  He remains abstinent from smoking.  He had a screening low-dose chest CT completed in January 2025.  There were no nodules noted and it was recommended that he have screening yearly afterwards.  He denies cough, chest pain, fever, chills, night sweats, weight loss, dyspnea on exertion.  He states that since he stopped smoking he has not had any cough.  He questions if he was allergic to cigarettes.  TEST/EVENTS : NPSG 05/10/97 ([facility]) AHI 88.7/ hr, desaturation to 57%  Allergies  Allergen Reactions   Pioglitazone     REACTION: edema    Immunization History  Administered Date(s) Administered   Influenza-Unspecified 04/24/2018   Moderna Sars-Covid-2 Vaccination 09/02/2019, 09/28/2019   Tdap 06/24/2008, 12/16/2017   Zoster Recombinant(Shingrix) 08/31/2020, 04/25/2021    Past Medical History:  Diagnosis Date   Diabetes mellitus    GERD (gastroesophageal reflux disease)    Hyperlipidemia    Hypertension    Personal history of colonic adenomas 02/16/2013    Polycythemia 06/24/2010   Hgb 17.8   Sleep apnea     Tobacco History: Social History   Tobacco Use  Smoking Status Former   Current packs/day: 3.00   Average packs/day: 3.0 packs/day for 0.3 years (0.9 ttl pk-yrs)   Types: Cigarettes   Start date: 10/20/2023  Smokeless Tobacco Never  Tobacco Comments   smoked 1977- 10/20/2023   Counseling given: Not Answered Tobacco comments: smoked 1977- 10/20/2023   Outpatient Medications Prior to Visit  Medication Sig Dispense Refill   aspirin  EC 81 MG tablet Take 1 tablet (81 mg total) by mouth daily. Swallow whole.     clotrimazole -betamethasone  (LOTRISONE ) cream APPLY 1 APPLICATION 2 (TWO) TIMES DAILY TOPICALLY. 135 g 1   dapagliflozin  propanediol (FARXIGA ) 10 MG TABS tablet TAKE 1 TABLET DAILY 30 tablet 5   esomeprazole  (NEXIUM ) 40 MG capsule TAKE 1 CAPSULE DAILY 90 capsule 1   fenofibrate  160 MG tablet Take 1 tablet (160 mg total) by mouth daily. 90 tablet 1   gentamicin (GARAMYCIN) 0.3 % ophthalmic solution Place 2 drops into the right eye every 4 (four) hours.     glimepiride  (AMARYL ) 1 MG tablet TAKE 1/2 TABLET DAILY WITH BREAKFAST 45 tablet 3   glucose blood (ONE TOUCH ULTRA TEST) test strip 1 each by Other route daily. E11.9 100 each 0   isosorbide  mononitrate (IMDUR ) 30 MG 24 hr  tablet Take 1 tablet (30 mg total) by mouth daily. 30 tablet 3   lisinopril  (ZESTRIL ) 20 MG tablet TAKE 1/2 TABLET DAILY 45 tablet 1   metFORMIN  (GLUCOPHAGE -XR) 500 MG 24 hr tablet Take 1 tablet (500 mg total) by mouth 2 (two) times daily. 180 tablet 3   ONETOUCH DELICA LANCETS MISC 1 each by Does not apply route See admin instructions. Check blood sugar once daily     OZEMPIC , 1 MG/DOSE, 4 MG/3ML SOPN INJECT 1MG  SUBCUTANEOUSLY  ONCE WEEKLY (EVERY 7 DAYS) AS DIRECTED 9 mL 3   rosuvastatin  (CRESTOR ) 20 MG tablet TAKE 1 TABLET DAILY 90 tablet 1   tamsulosin  (FLOMAX ) 0.4 MG CAPS capsule Take 1 capsule (0.4 mg total) by mouth daily. 90 capsule 1   isosorbide   mononitrate (IMDUR ) 30 MG 24 hr tablet Take 1 tablet (30 mg total) by mouth daily. 90 tablet 3   Varenicline  Tartrate, Starter, (CHANTIX  STARTING MONTH PAK) 0.5 MG X 11 & 1 MG X 42 TBPK Take 1 tablet by mouth as directed. 53 each 0   Varenicline  Tartrate,Continue, 1 MG TABS Take 1 mg by mouth 2 (two) times daily. 56 tablet 0   No facility-administered medications prior to visit.     Review of Systems:   Constitutional:   No  weight loss, night sweats,  Fevers, chills, fatigue, or  lassitude.  HEENT:   No headaches,  Difficulty swallowing,  Tooth/dental problems, or  Sore throat,                No sneezing, itching, ear ache, nasal congestion, post nasal drip,   CV:  No chest pain,  Orthopnea, PND, swelling in lower extremities, anasarca, dizziness, palpitations, syncope.   GI  No heartburn, indigestion, abdominal pain, nausea, vomiting, diarrhea, change in bowel habits, loss of appetite, bloody stools.   Resp: No shortness of breath with exertion or at rest.  No excess mucus, no productive cough,  No non-productive cough,  No coughing up of blood.  No change in color of mucus.  No wheezing.  No chest wall deformity  Skin: no rash or lesions.  GU: no dysuria, change in color of urine, no urgency or frequency.  No flank pain, no hematuria   MS:  No joint pain or swelling.  No decreased range of motion.  No back pain.    Physical Exam  BP 118/60   Pulse 73   Temp 98.3 F (36.8 C) (Oral)   Ht 5' 7 (1.702 m)   Wt 222 lb (100.7 kg)   SpO2 97% Comment: room air  BMI 34.77 kg/m   GEN: A/Ox3; pleasant , NAD, well nourished    HEENT:  [place]/AT,  EACs-clear, TMs-wnl, NOSE-clear, THROAT-clear, no lesions, no postnasal drip or exudate noted.  Mallampati 4  NECK:  Supple w/ fair ROM; no JVD; normal carotid impulses w/o bruits; no thyromegaly or nodules palpated; no lymphadenopathy.    RESP  Clear  P & A; w/o, wheezes/ rales/ or rhonchi. no accessory muscle use, no dullness to  percussion  CARD:  RRR, no m/r/g, no peripheral edema, pulses intact, no cyanosis or clubbing.  GI: Obese soft & nt; nml bowel sounds; no organomegaly or masses detected.   Musco: Warm bil, no deformities or joint swelling noted.   Neuro: alert, no focal deficits noted.    Skin: Warm, no lesions or rashes    Lab Results:  CBC    Component Value Date/Time   WBC 6.1 09/11/2023 0906  RBC 5.43 09/11/2023 0906   HGB 16.2 09/11/2023 0906   HCT 48.1 09/11/2023 0906   PLT 176.0 09/11/2023 0906   MCV 88.5 09/11/2023 0906   MCHC 33.8 09/11/2023 0906   RDW 14.1 09/11/2023 0906   LYMPHSABS 1.5 09/11/2023 0906   MONOABS 0.5 09/11/2023 0906   EOSABS 0.3 09/11/2023 0906   BASOSABS 0.1 09/11/2023 0906    BMET    Component Value Date/Time   NA 137 09/11/2023 0906   K 4.9 09/11/2023 0906   CL 100 09/11/2023 0906   CO2 29 09/11/2023 0906   GLUCOSE 151 (H) 09/11/2023 0906   BUN 16 09/11/2023 0906   CREATININE 1.12 09/11/2023 0906   CREATININE 1.17 05/06/2014 0735   CALCIUM  9.5 09/11/2023 0906   GFRNONAA 86 01/05/2007 0000   GFRAA 103 01/05/2007 0000    BNP No results found for: BNP  ProBNP No results found for: PROBNP  Imaging: No results found.  Administration History     None           No data to display          No results found for: NITRICOXIDE   Assessment & Plan:   Assessment & Plan OBSTRUCTIVE SLEEP APNEA -  Reports perceived benefit from using the machine -  Replacement machine ordered today. -  Plan for follow-up in 8 weeks for compliance download review. Asthma, unspecified asthma severity, unspecified whether complicated, unspecified whether persistent - Self-reported; no PFTs available for review. - Patient reports he does not have any issues with dyspnea or cough - PFTs previously ordered but not completed.  Would recommend completing PFTs however patient refuses presently. Tobacco use - Remains abstinent from  smoking    Return in about 9 weeks (around 04/12/2024) for compliance download review.  Candis Dandy, PA-C 02/09/2024

## 2024-02-09 NOTE — Patient Instructions (Signed)
 New CPAP replacement ordered today  Continue to wear CPAP nightly.  Follow up in 8-10 weeks for compliance download review.  Return to clinic sooner if new or worsening symptoms.

## 2024-02-09 NOTE — Telephone Encounter (Signed)
 Will notify Candis Dandy, PA that patient would like to switch DME companies from [employer] to [employer] for his CPAP.  Candis, can we put in an order to change patients DME company to [facility]?  Please advise.  Thank you

## 2024-02-09 NOTE — Assessment & Plan Note (Addendum)
-    Reports perceived benefit from using the machine -  Replacement machine ordered today. -  Plan for follow-up in 8 weeks for compliance download review.

## 2024-02-09 NOTE — Assessment & Plan Note (Signed)
Remains abstinent from smoking.

## 2024-02-10 NOTE — Telephone Encounter (Signed)
 Hello [facility] team! Candis Dandy, PA put in an order yesterday during patients OV for him to get a new CPAP as his current one is not working. Patient has [employer]. Patient wants to start using [facility] as he does not like the customer service with [employer]. Can you change this DME? Thank you!

## 2024-02-11 NOTE — Telephone Encounter (Signed)
 I have sent the order to [facility] and the order has been accepted by their Represenative. I'll inform the patient to let them know

## 2024-02-25 DIAGNOSIS — G4733 Obstructive sleep apnea (adult) (pediatric): Secondary | ICD-10-CM | POA: Diagnosis not present

## 2024-03-06 ENCOUNTER — Other Ambulatory Visit: Payer: Self-pay | Admitting: Nurse Practitioner

## 2024-03-08 NOTE — Telephone Encounter (Signed)
 Requesting: FENOFIBRATE   TAB 160MG   Last Visit: 09/11/2023 Next Visit: 03/15/2024 Last Refill: 09/11/2023  Please Advise

## 2024-03-15 ENCOUNTER — Ambulatory Visit: Admitting: Nurse Practitioner

## 2024-03-22 ENCOUNTER — Ambulatory Visit (INDEPENDENT_AMBULATORY_CARE_PROVIDER_SITE_OTHER): Admitting: Nurse Practitioner

## 2024-03-22 ENCOUNTER — Encounter: Payer: Self-pay | Admitting: Nurse Practitioner

## 2024-03-22 VITALS — BP 124/66 | HR 65 | Temp 96.8°F | Ht 67.0 in | Wt 218.8 lb

## 2024-03-22 DIAGNOSIS — E1165 Type 2 diabetes mellitus with hyperglycemia: Secondary | ICD-10-CM | POA: Diagnosis not present

## 2024-03-22 DIAGNOSIS — Z7985 Long-term (current) use of injectable non-insulin antidiabetic drugs: Secondary | ICD-10-CM | POA: Diagnosis not present

## 2024-03-22 DIAGNOSIS — E1169 Type 2 diabetes mellitus with other specified complication: Secondary | ICD-10-CM

## 2024-03-22 DIAGNOSIS — Z7984 Long term (current) use of oral hypoglycemic drugs: Secondary | ICD-10-CM | POA: Diagnosis not present

## 2024-03-22 DIAGNOSIS — E785 Hyperlipidemia, unspecified: Secondary | ICD-10-CM

## 2024-03-22 LAB — POCT GLUCOSE (DEVICE FOR HOME USE)
Glucose Fasting, POC: 214 mg/dL — AB (ref 70–99)
POC Glucose: 214 mg/dL — AB (ref 70–99)

## 2024-03-22 LAB — POCT GLYCOSYLATED HEMOGLOBIN (HGB A1C)
HbA1c POC (<> result, manual entry): 12.3 % (ref 4.0–5.6)
HbA1c, POC (controlled diabetic range): 12.3 % — AB (ref 0.0–7.0)
HbA1c, POC (prediabetic range): 12.3 % — AB (ref 5.7–6.4)
Hemoglobin A1C: 12.3 % — AB (ref 4.0–5.6)

## 2024-03-22 MED ORDER — TAMSULOSIN HCL 0.4 MG PO CAPS: 0.4000 mg | ORAL_CAPSULE | Freq: Every day | ORAL | 1 refills | Status: AC

## 2024-03-22 MED ORDER — DAPAGLIFLOZIN PROPANEDIOL 10 MG PO TABS: 10.0000 mg | ORAL_TABLET | Freq: Every day | ORAL | 1 refills | Status: AC

## 2024-03-22 MED ORDER — SEMAGLUTIDE (2 MG/DOSE) 8 MG/3ML ~~LOC~~ SOPN: 2.0000 mg | PEN_INJECTOR | SUBCUTANEOUS | 1 refills | Status: DC

## 2024-03-22 MED ORDER — METFORMIN HCL ER 500 MG PO TB24: 500.0000 mg | ORAL_TABLET | Freq: Two times a day (BID) | ORAL | 1 refills | Status: AC

## 2024-03-22 NOTE — Assessment & Plan Note (Signed)
 Farxiga  10mg  daily, Amaryl  1/2mg  daily, Metformin  XR 500mg  BID

## 2024-03-22 NOTE — Assessment & Plan Note (Signed)
 Chronic, stable. Continue rosuvastatin  20mg  daily and fenofibrate  160mg  daily.

## 2024-03-22 NOTE — Patient Instructions (Signed)
 It was great to see you!  Call to schedule eye exam  Add in gatorade zero or pedialyte once a day with all the water you are drinking   Let's increase your ozempic  to 2 mg weekly   Let's follow-up in 3 months, sooner if you have concerns.  If a referral was placed today, you will be contacted for an appointment. Please note that routine referrals can sometimes take up to 3-4 weeks to process. Please call our office if you haven't heard anything after this time frame.  Take care,  Tinnie Harada, NP

## 2024-03-22 NOTE — Assessment & Plan Note (Signed)
 Chronic, not controlled. A1c is 12.3% and glucose 214, indicating poor glycemic control. Current medications include Farxiga  10mg  daily, Amaryl  1/2mg  daily, Metformin  XR 500mg  BID, and Ozempic  1mg  weekly. He is not seeing an endocrinologist. Increase Ozempic  to 2 mg weekly and send the prescription to the mail order pharmacy. Encourage focus on nutrition and suggest sugar-free gum to manage oral fixation. Advise obtaining a battery for the glucose meter to monitor blood sugar levels.

## 2024-03-22 NOTE — Progress Notes (Signed)
 Established Patient Office Visit  Subjective   Patient ID: Deklan Minar., male    DOB: October 15, 1960  Age: 63 y.o. MRN: [id number]  Chief Complaint  Patient presents with   Diabetes    Follow up, Rx refills, glucose elevated    HPI  Discussed the use of AI scribe software for clinical note transcription with the patient, who gave verbal consent to proceed.  History of Present Illness   Blue Winther. is a 63 year old male with diabetes who presents with elevated blood sugar levels.  He notes that recent his blood sugars have been elevated and a blood sugar reading of 505 mg/dL. His current medications include Farxiga  10mg  daily, Amaryl  1/2mg  daily, metformin  XR 500mg  BID, and Ozempic  1mg  weekly. He is not seeing an endocrinologist.  He has gained seven pounds in six months, now weighing 211 pounds, due to increased appetite and food intake after quitting smoking on April 28th. He consumes a lot of potatoes and bread and drinks about fifteen bottles of water daily. He has been trying to improve his diet over the last two weeks.  He smoked three packs a day for fifteen years but quit on April 28th. He uses a Fidgit spinner and deep breathing exercises to manage cravings and has not smoked since quitting. He has not consumed tea or Coca-Cola since 2017, only water or coffee.        ROS See pertinent positives and negatives per HPI.    Objective:     BP 124/66 (BP Location: Left Arm, Patient Position: Sitting, Cuff Size: Normal)   Pulse 65   Temp (!) 96.8 F (36 C)   Ht 5' 7 (1.702 m)   Wt 218 lb 12.8 oz (99.2 kg)   SpO2 97%   BMI 34.27 kg/m  BP Readings from Last 3 Encounters:  03/22/24 124/66  02/09/24 118/60  01/21/24 122/70   Wt Readings from Last 3 Encounters:  03/22/24 218 lb 12.8 oz (99.2 kg)  02/09/24 222 lb (100.7 kg)  01/21/24 213 lb 6.4 oz (96.8 kg)      Physical Exam Vitals and nursing note reviewed.  Constitutional:      Appearance: Normal  appearance.  HENT:     Head: Normocephalic.  Eyes:     Conjunctiva/sclera: Conjunctivae normal.  Cardiovascular:     Rate and Rhythm: Normal rate and regular rhythm.     Pulses: Normal pulses.     Heart sounds: Normal heart sounds.  Pulmonary:     Effort: Pulmonary effort is normal.     Breath sounds: Normal breath sounds.  Musculoskeletal:     Cervical back: Normal range of motion.  Skin:    General: Skin is warm.  Neurological:     General: No focal deficit present.     Mental Status: He is alert and oriented to person, place, and time.  Psychiatric:        Mood and Affect: Mood normal.        Behavior: Behavior normal.        Thought Content: Thought content normal.        Judgment: Judgment normal.      Assessment & Plan:   Problem List Items Addressed This Visit       Endocrine   Uncontrolled type 2 diabetes mellitus with hyperglycemia (HCC)   Chronic, not controlled. A1c is 12.3% and glucose 214, indicating poor glycemic control. Current medications include Farxiga  10mg  daily, Amaryl  1/2mg  daily,  Metformin  XR 500mg  BID, and Ozempic  1mg  weekly. He is not seeing an endocrinologist. Increase Ozempic  to 2 mg weekly and send the prescription to the mail order pharmacy. Encourage focus on nutrition and suggest sugar-free gum to manage oral fixation. Advise obtaining a battery for the glucose meter to monitor blood sugar levels.       Relevant Medications   metFORMIN  (GLUCOPHAGE -XR) 500 MG 24 hr tablet   Semaglutide , 2 MG/DOSE, 8 MG/3ML SOPN   dapagliflozin  propanediol (FARXIGA ) 10 MG TABS tablet   Other Relevant Orders   POCT Glucose (Device for Home Use) (Completed)   POCT glycosylated hemoglobin (Hb A1C) (Completed)   Hyperlipidemia associated with type 2 diabetes mellitus (HCC)   Chronic, stable. Continue rosuvastatin  20mg  daily and fenofibrate  160mg  daily.       Relevant Medications   metFORMIN  (GLUCOPHAGE -XR) 500 MG 24 hr tablet   Semaglutide , 2 MG/DOSE, 8  MG/3ML SOPN   dapagliflozin  propanediol (FARXIGA ) 10 MG TABS tablet     Other   Long-term current use of injectable noninsulin antidiabetic medication   Increase ozempic  to 2mg  weekly.       Long term current use of oral hypoglycemic drug - Primary    Farxiga  10mg  daily, Amaryl  1/2mg  daily, Metformin  XR 500mg  BID       Return in about 3 months (around 06/21/2024) for Diabetes.    Tinnie DELENA Harada, NP

## 2024-03-22 NOTE — Assessment & Plan Note (Signed)
Increase ozempic to 2 mg weekly 

## 2024-03-23 ENCOUNTER — Other Ambulatory Visit: Payer: Self-pay | Admitting: Nurse Practitioner

## 2024-03-23 NOTE — Telephone Encounter (Signed)
 Requesting: OZEMPIC , 2 MG/DOSE, 8 MG/3ML SOPN  Last Visit: 03/22/2024 Next Visit: 06/21/2024 Last Refill: 03/22/2024  Please Advise   Pharmacy comment: Your patients prescription plan requires a three month supply. May we adjust the quantity to a three month supply with 3 refills? Your patients prescription plan requires a three month supply. May we adjust the quantity to a three month supply with 3 refills?

## 2024-03-26 ENCOUNTER — Emergency Department (HOSPITAL_BASED_OUTPATIENT_CLINIC_OR_DEPARTMENT_OTHER)
Admission: EM | Admit: 2024-03-26 | Discharge: 2024-03-26 | Disposition: A | Discharge status: Home / Self Care | Attending: Emergency Medicine | Admitting: Emergency Medicine

## 2024-03-26 ENCOUNTER — Encounter (HOSPITAL_BASED_OUTPATIENT_CLINIC_OR_DEPARTMENT_OTHER): Payer: Self-pay | Admitting: Emergency Medicine

## 2024-03-26 ENCOUNTER — Emergency Department (HOSPITAL_BASED_OUTPATIENT_CLINIC_OR_DEPARTMENT_OTHER)

## 2024-03-26 ENCOUNTER — Other Ambulatory Visit: Payer: Self-pay

## 2024-03-26 DIAGNOSIS — Z7984 Long term (current) use of oral hypoglycemic drugs: Secondary | ICD-10-CM | POA: Diagnosis not present

## 2024-03-26 DIAGNOSIS — I10 Essential (primary) hypertension: Secondary | ICD-10-CM | POA: Diagnosis not present

## 2024-03-26 DIAGNOSIS — Z720 Tobacco use: Secondary | ICD-10-CM | POA: Diagnosis not present

## 2024-03-26 DIAGNOSIS — R202 Paresthesia of skin: Secondary | ICD-10-CM | POA: Diagnosis not present

## 2024-03-26 DIAGNOSIS — Z79899 Other long term (current) drug therapy: Secondary | ICD-10-CM | POA: Diagnosis not present

## 2024-03-26 DIAGNOSIS — Z7982 Long term (current) use of aspirin: Secondary | ICD-10-CM | POA: Insufficient documentation

## 2024-03-26 DIAGNOSIS — M50122 Cervical disc disorder at C5-C6 level with radiculopathy: Secondary | ICD-10-CM | POA: Diagnosis not present

## 2024-03-26 DIAGNOSIS — E871 Hypo-osmolality and hyponatremia: Secondary | ICD-10-CM | POA: Diagnosis not present

## 2024-03-26 DIAGNOSIS — I672 Cerebral atherosclerosis: Secondary | ICD-10-CM | POA: Diagnosis not present

## 2024-03-26 DIAGNOSIS — E119 Type 2 diabetes mellitus without complications: Secondary | ICD-10-CM | POA: Insufficient documentation

## 2024-03-26 LAB — COMPREHENSIVE METABOLIC PANEL WITH GFR
ALT: 20 U/L (ref 0–44)
AST: 20 U/L (ref 15–41)
Albumin: 4.4 g/dL (ref 3.5–5.0)
Alkaline Phosphatase: 55 U/L (ref 38–126)
Anion gap: 12 (ref 5–15)
BUN: 25 mg/dL — ABNORMAL HIGH (ref 8–23)
CO2: 25 mmol/L (ref 22–32)
Calcium: 9.8 mg/dL (ref 8.9–10.3)
Chloride: 93 mmol/L — ABNORMAL LOW (ref 98–111)
Creatinine, Ser: 1.26 mg/dL — ABNORMAL HIGH (ref 0.61–1.24)
GFR, Estimated: >60 mL/min (ref >60)
Glucose, Bld: 262 mg/dL — ABNORMAL HIGH (ref 70–99)
Potassium: 4.7 mmol/L (ref 3.5–5.1)
Sodium: 130 mmol/L — ABNORMAL LOW (ref 135–145)
Total Bilirubin: 0.6 mg/dL (ref 0.0–1.2)
Total Protein: 6.9 g/dL (ref 6.5–8.1)

## 2024-03-26 LAB — TROPONIN T, HIGH SENSITIVITY
Troponin T High Sensitivity: 25 ng/L — ABNORMAL HIGH (ref 0–19)
Troponin T High Sensitivity: 18 ng/L (ref 0–19)

## 2024-03-26 LAB — CBC
HCT: 43.5 % (ref 39.0–52.0)
Hemoglobin: 15.1 g/dL (ref 13.0–17.0)
MCH: 29 pg (ref 26.0–34.0)
MCHC: 34.7 g/dL (ref 30.0–36.0)
MCV: 83.7 fL (ref 80.0–100.0)
Platelets: 227 K/uL (ref 150–400)
RBC: 5.2 MIL/uL (ref 4.22–5.81)
RDW: 12.1 % (ref 11.5–15.5)
WBC: 8 K/uL (ref 4.0–10.5)
nRBC: 0 % (ref 0.0–0.2)

## 2024-03-26 LAB — MAGNESIUM: Magnesium: 2 mg/dL (ref 1.7–2.4)

## 2024-03-26 MED ORDER — SODIUM CHLORIDE 0.9 % IV BOLUS
1000.0000 mL | Freq: Once | INTRAVENOUS | Status: AC
  Administered 2024-03-26: 1000 mL via INTRAVENOUS

## 2024-03-26 NOTE — ED Provider Notes (Signed)
 [facility] [place] [facility] Provider Note   CSN: [id number] Arrival date & time: 03/26/24  1159     Patient presents with: Numbness   Zachary Clay. is a 63 y.o. male.   HPI  Patient is a 63 year old male with a past medical history significant for DM2, HLD, sleep apnea, HTN, tobacco use  Patient presents emergency room today with left arm tingling sensations that he woke up with this morning he states he went to bed around 8:30 PM last night and woke up around 6 AM this morning with the symptoms.  He has not noticed any weakness associated with the tingling sensation.  He is not completely numb but states that it feels abnormal in his hand.  No slurred speech, confusion, head injuries, new neck pain.  He is not on any anticoagulation he he has not had any weakness in either hand.  No chest pain or difficulty breathing nausea or exertional worsening of the symptoms.      Prior to Admission medications   Medication Sig Start Date End Date Taking? Authorizing Provider  aspirin  EC 81 MG tablet Take 1 tablet (81 mg total) by mouth daily. Swallow whole. 10/09/23   Krasowski, Robert J, MD  clotrimazole -betamethasone  (LOTRISONE ) cream APPLY 1 APPLICATION 2 (TWO) TIMES DAILY TOPICALLY. 11/02/20   Tabori, Katherine E, MD  dapagliflozin  propanediol (FARXIGA ) 10 MG TABS tablet Take 1 tablet (10 mg total) by mouth daily. 03/22/24   McElwee, Tinnie LABOR, NP  esomeprazole  (NEXIUM ) 40 MG capsule TAKE 1 CAPSULE DAILY 10/13/23   Levora Reyes SAUNDERS, MD  fenofibrate  160 MG tablet TAKE 1 TABLET DAILY 03/08/24   McElwee, Lauren A, NP  gentamicin (GARAMYCIN) 0.3 % ophthalmic solution Place 2 drops into the right eye every 4 (four) hours. Patient not taking: Reported on 03/22/2024 02/06/24   [provider]  glimepiride  (AMARYL ) 1 MG tablet TAKE 1/2 TABLET DAILY WITH BREAKFAST 09/22/23   Trixie File, MD  glucose blood (ONE TOUCH ULTRA TEST) test strip 1 each by Other  route daily. E11.9 Patient not taking: Reported on 03/22/2024 11/08/19   Kassie Mallick, MD  isosorbide  mononitrate (IMDUR ) 30 MG 24 hr tablet Take 1 tablet (30 mg total) by mouth daily. 10/20/23   Krasowski, Robert J, MD  lisinopril  (ZESTRIL ) 20 MG tablet TAKE 1/2 TABLET DAILY 07/09/23   Tabori, Katherine E, MD  metFORMIN  (GLUCOPHAGE -XR) 500 MG 24 hr tablet Take 1 tablet (500 mg total) by mouth 2 (two) times daily with a meal. 03/22/24   McElwee, Tinnie LABOR, NP  ONETOUCH DELICA LANCETS MISC 1 each by Does not apply route See admin instructions. Check blood sugar once daily Patient not taking: Reported on 03/22/2024    [provider]  rosuvastatin  (CRESTOR ) 20 MG tablet TAKE 1 TABLET DAILY 10/13/23   Levora Reyes SAUNDERS, MD  Semaglutide , 2 MG/DOSE, (OZEMPIC , 2 MG/DOSE,) 8 MG/3ML SOPN INJECT 2MG  SUBCUTANEOUSLY  ONCE WEEKLY (EVERY 7 DAYS) AS DIRECTED 03/24/24   McElwee, Lauren A, NP  tamsulosin  (FLOMAX ) 0.4 MG CAPS capsule Take 1 capsule (0.4 mg total) by mouth daily. 03/22/24   McElwee, Tinnie LABOR, NP    Allergies: Pioglitazone    Review of Systems  Updated Vital Signs BP 127/72   Pulse 64   Temp 97.6 F (36.4 C)   Resp 15   Ht 5' 7 (1.702 m)   Wt 99.2 kg   SpO2 97%   BMI 34.27 kg/m   Physical Exam Vitals and nursing  note reviewed.  Constitutional:      General: He is not in acute distress. HENT:     Head: Normocephalic and atraumatic.     Nose: Nose normal.  Eyes:     General: No scleral icterus. Cardiovascular:     Rate and Rhythm: Normal rate and regular rhythm.     Pulses: Normal pulses.     Heart sounds: Normal heart sounds.  Pulmonary:     Effort: Pulmonary effort is normal. No respiratory distress.     Breath sounds: No wheezing.  Abdominal:     Palpations: Abdomen is soft.     Tenderness: There is no abdominal tenderness.  Musculoskeletal:     Cervical back: Normal range of motion.     Right lower leg: No edema.     Left lower leg: No edema.  Skin:    General:  Skin is warm and dry.     Capillary Refill: Capillary refill takes less than 2 seconds.  Neurological:     Mental Status: He is alert. Mental status is at baseline.     Comments: Alert and oriented to self, place, time and event.   Speech is fluent, clear without dysarthria or dysphasia.   Strength 5/5 in upper/lower extremities   Sensation intact in upper/lower extremities   Normal gait.  Negative Romberg. No pronator drift.  Normal finger-to-nose and feet tapping.  CN I not tested  CN II grossly intact visual fields bilaterally. Did not visualize posterior eye.  CN III, IV, VI PERRLA and EOMs intact bilaterally  CN V Intact sensation to sharp and light touch to the face  CN VII facial movements symmetric  CN VIII not tested  CN IX, X no uvula deviation, symmetric rise of soft palate  CN XI 5/5 SCM and trapezius strength bilaterally  CN XII Midline tongue protrusion, symmetric L/R movements     Psychiatric:        Mood and Affect: Mood normal.        Behavior: Behavior normal.     (all labs ordered are listed, but only abnormal results are displayed) Labs Reviewed  COMPREHENSIVE METABOLIC PANEL WITH GFR - Abnormal; Notable for the following components:      Result Value   Sodium 130 (*)    Chloride 93 (*)    Glucose, Bld 262 (*)    BUN 25 (*)    Creatinine, Ser 1.26 (*)    All other components within normal limits  TROPONIN T, HIGH SENSITIVITY - Abnormal; Notable for the following components:   Troponin T High Sensitivity 25 (*)    All other components within normal limits  CBC  MAGNESIUM   TROPONIN T, HIGH SENSITIVITY    EKG: EKG Interpretation Date/Time:  Friday March 26 2024 12:47:58 EDT Ventricular Rate:  64 PR Interval:  150 QRS Duration:  136 QT Interval:  413 QTC Calculation: 427 R Axis:   6  Text Interpretation: Sinus rhythm Right bundle branch block Nonspecific ST changes with new T waves inversions V2 Confirmed by Zachary Chew [phone]) on 03/26/2024  12:50:55 PM  Radiology: CT Cervical Spine Wo Contrast Result Date: 03/26/2024 CLINICAL DATA:  Cervical radiculopathy, no red flags Left arm numbness. EXAM: CT CERVICAL SPINE WITHOUT CONTRAST TECHNIQUE: Multidetector CT imaging of the cervical spine was performed without intravenous contrast. Multiplanar CT image reconstructions were also generated. RADIATION DOSE REDUCTION: This exam was performed according to the departmental dose-optimization program which includes automated exposure control, adjustment of the mA and/or kV according  to patient size and/or use of iterative reconstruction technique. COMPARISON:  None Available. FINDINGS: Alignment: Normal. Skull base and vertebrae: No acute fracture. No primary bone lesion or focal pathologic process. Soft tissues and spinal canal: No prevertebral fluid or swelling. No visible canal hematoma. Disc levels: Mild disc space narrowing with right paracentral disc protrusion at C5-C6. Narrowing of the right neural foramina but no spinal canal stenosis. Disc space narrowing and spurring with broad-based disc bulge at C6-C7. No high-grade canal stenosis. Upper chest: No acute findings. Other: Carotid calcifications. IMPRESSION: 1. No acute findings. 2. Degenerative disc disease at C5-C6 and C6-C7. Right paracentral disc protrusion at C5-C6 with narrowing of the right neural foramina. Consider cervical spine MRI for further assessment as clinically indicated. Electronically Signed   By: Zachary Clay M.D.   On: 03/26/2024 14:02   CT Head Wo Contrast Result Date: 03/26/2024 CLINICAL DATA:  Neuro deficit, acute, stroke suspected Left arm tingling EXAM: CT HEAD WITHOUT CONTRAST TECHNIQUE: Contiguous axial images were obtained from the base of the skull through the vertex without intravenous contrast. RADIATION DOSE REDUCTION: This exam was performed according to the departmental dose-optimization program which includes automated exposure control, adjustment of the mA  and/or kV according to patient size and/or use of iterative reconstruction technique. COMPARISON:  None Available. FINDINGS: Brain: No intracranial hemorrhage, mass effect, or midline shift. No hydrocephalus. The basilar cisterns are patent. No evidence of territorial infarct or acute ischemia. No extra-axial or intracranial fluid collection. Vascular: Atherosclerosis of skullbase vasculature without hyperdense vessel or abnormal calcification. Skull: No fracture or focal lesion. Sinuses/Orbits: Diffuse paranasal sinus mucosal thickening. No sinus fluid levels. No mastoid effusion. Other: None. IMPRESSION: 1. No acute intracranial abnormality. 2. Diffuse paranasal sinus mucosal thickening. Electronically Signed   By: Zachary Clay M.D.   On: 03/26/2024 13:40     Procedures   Medications Ordered in the ED  sodium chloride  0.9 % bolus 1,000 mL (0 mLs Intravenous Stopped 03/26/24 1443)    Clinical Course as of 03/27/24 1255  Fri Mar 26, 2024  1345 Electrolytes normal apart from mild hyponatremia suspect dehydration with very mild elevation in creatinine does not qualify as an AKI.  No anemia, magnesium  normal. [WF]  1346 Pending CT C-spine and troponin [WF]  Sat Mar 27, 2024  1138 Zachary Clay of cardiology - will arrange follow up expeditiously.  [WF]    Clinical Course User Index [WF] Neldon Hamp RAMAN, Zachary Clay                                 Medical Decision Making Amount and/or Complexity of Data Reviewed Labs: ordered. Radiology: ordered.    This patient presents to the ED for concern of numbness, this involves a number of treatment options, and is a complaint that carries with it a moderate risk of complications and morbidity. A differential diagnosis was considered for the patient's symptoms which is discussed below:   Considered cervical impingement, nerve compression, stroke, MS, Saturday night palsy  Co morbidities: Discussed in HPI   Brief History:  Patient is a 63 year old  male with a past medical history significant for DM2, HLD, sleep apnea, HTN, tobacco use  Patient presents emergency room today with left arm tingling sensations that he woke up with this morning he states he went to bed around 8:30 PM last night and woke up around 6 AM this morning with the symptoms.  He has not noticed  any weakness associated with the tingling sensation.  He is not completely numb but states that it feels abnormal in his hand.  No slurred speech, confusion, head injuries, new neck pain.  He is not on any anticoagulation he he has not had any weakness in either hand.  No chest pain or difficulty breathing nausea or exertional worsening of the symptoms.     EMR reviewed including pt PMHx, past surgical history and past visits to ER.   See HPI for more details   Lab Tests:   I ordered and independently interpreted labs. Labs notable for CMP with hyperglycemia.  No anion gap creatinine approximately baseline CBC without anemia magnesium  normal.  Troponin marginally elevated and downtrending.  Imaging Studies:  NAD. I personally reviewed all imaging studies and no acute abnormality found. I agree with radiology interpretation.  CT head unremarkable CT C-spine with more right sided stenosis.  Cardiac Monitoring:  The patient was maintained on a cardiac monitor.  I personally viewed and interpreted the cardiac monitored which showed an underlying rhythm of: NSR EKG non-ischemic   Medicines ordered:  I ordered medication including 1 L normal saline for hydration Reevaluation of the patient after these medicines showed that the patient improved I have reviewed the patients home medicines and have made adjustments as needed   Critical Interventions:     Consults/Attending Physician   I discussed this case with my attending physician who cosigned this note including patient's presenting symptoms, physical exam, and planned diagnostics and interventions.  Attending physician stated agreement with plan or made changes to plan which were implemented.   Reevaluation:  After the interventions noted above I re-evaluated patient and found that they have :improved   Social Determinants of Health:      Problem List / ED Course:  Patient initially with full left arm paresthesia at this which is now resolved to the wrist area only.  Normal neuroexam throughout.  Did have marginally elevated troponin which downtrended discussed with Dr. Raford of cardiology to make sure that patient's left arm paresthesias could not be considered a anginal equivalent.  She does not feel that this is likely.  Patient is feeling much improved now.  Recommend close outpatient follow-up with primary care may benefit from MRI C-spine/MRI brain outpatient.  Return precautions emergency room provided.  Had a lengthy discussion with patient and his wife about the need for very watchful monitoring of his symptoms as any worsening or progression could indicate stroke.   Dispostion:  After consideration of the diagnostic results and the patients response to treatment, I feel that the patent would benefit from outpatient follow-up   Final diagnoses:  Paresthesia    ED Discharge Orders     None          Neldon Hamp RAMAN, Zachary Clay 03/27/24 1357    Zachary Fonda MATSU, MD 03/29/24 [phone]

## 2024-03-26 NOTE — ED Triage Notes (Signed)
 LKW 2030 10/2.  C/o L arm numbness (tingling) since waking this morning. Worse this morning, some improvement. Pt also reports hyperglycemia earlier this week.   EDP at bedside.

## 2024-03-26 NOTE — Discharge Instructions (Signed)
 Your workup today has been quite reassuring and want you to keep a close eye in your symptoms and if they return go immediately to the [facility] emergency department similarly if you develop any difficulty breathing or chest pain please go immediately to the [facility] emergency department.  Otherwise our cardiology group will call you for follow-up.

## 2024-04-05 ENCOUNTER — Ambulatory Visit (INDEPENDENT_AMBULATORY_CARE_PROVIDER_SITE_OTHER)

## 2024-04-05 ENCOUNTER — Ambulatory Visit (HOSPITAL_BASED_OUTPATIENT_CLINIC_OR_DEPARTMENT_OTHER)

## 2024-04-05 ENCOUNTER — Encounter (HOSPITAL_BASED_OUTPATIENT_CLINIC_OR_DEPARTMENT_OTHER): Payer: Self-pay

## 2024-04-05 VITALS — BP 122/74 | HR 72 | Ht 67.0 in | Wt 214.0 lb

## 2024-04-05 DIAGNOSIS — G4733 Obstructive sleep apnea (adult) (pediatric): Secondary | ICD-10-CM | POA: Diagnosis not present

## 2024-04-05 DIAGNOSIS — J45909 Unspecified asthma, uncomplicated: Secondary | ICD-10-CM

## 2024-04-05 DIAGNOSIS — F17210 Nicotine dependence, cigarettes, uncomplicated: Secondary | ICD-10-CM | POA: Diagnosis not present

## 2024-04-05 DIAGNOSIS — Z720 Tobacco use: Secondary | ICD-10-CM

## 2024-04-05 NOTE — Assessment & Plan Note (Signed)
-    Recommend PFTs to reevaluate; patient declines presently

## 2024-04-05 NOTE — Assessment & Plan Note (Signed)
Remains abstinent from smoking.

## 2024-04-05 NOTE — Progress Notes (Signed)
 @Patient  ID: Zachary CHRISTELLA Ronnald Clay., male    DOB: 1961/05/29, 63 y.o.   MRN: [id number]  Chief Complaint  Patient presents with   Sleep Apnea    Follow up     Referring provider: Nedra Tinnie LABOR, NP  HPI: Zachary Clay. Is a 63 y/o male with PMH of OSA on CPAP, HTN, DM2, GERD, and obesity who presents today for follow up after getting a new CPAP machine.  He started the new machine since our last visit; compliance download was reviewed with him today.  This demonstrated excellent compliance with average usage of 8 hrs and 36 min per night.  Residual AHI of 8.9 with an acceptable leak profile noted.  He reports that he feels daily benefit from using the machine.  If he does not use the machine he reports feeling as though he has a hangover the next day.  He denies chest pain, DOE, fever, chills, palpitations, HA, or other c/o.  He remains abstinent from smoking.  TEST/EVENTS :  NPSG 05/10/97 ([facility]) AHI 88.7/ hr, desaturation to 57% LDCT 07/09/2023:  IMPRESSION: 1. Lung-RADS 1, negative. Continue annual screening with low-dose chest CT without contrast in 12 months. 2. Aortic atherosclerosis (ICD10-I70.0), coronary artery atherosclerosis and emphysema (ICD10-J43.9). 3. Cholelithiasis 4. Aortic valvular calcifications. Consider echocardiography to evaluate for valvular dysfunction.    Allergies  Allergen Reactions   Pioglitazone     REACTION: edema    Immunization History  Administered Date(s) Administered   Influenza-Unspecified 04/24/2018   Moderna Sars-Covid-2 Vaccination 09/02/2019, 09/28/2019   Tdap 06/24/2008, 12/16/2017   Zoster Recombinant(Shingrix) 08/31/2020, 04/25/2021    Past Medical History:  Diagnosis Date   Diabetes mellitus    GERD (gastroesophageal reflux disease)    Hyperlipidemia    Hypertension    Personal history of colonic adenomas 02/16/2013   Polycythemia 06/24/2010   Hgb 17.8   Sleep apnea     Tobacco History: Social History    Tobacco Use  Smoking Status Former   Current packs/day: 3.00   Average packs/day: 3.0 packs/day for 0.5 years (1.4 ttl pk-yrs)   Types: Cigarettes   Start date: 10/20/2023  Smokeless Tobacco Never  Tobacco Comments   smoked 1977- 10/20/2023   Counseling given: Not Answered Tobacco comments: smoked 1977- 10/20/2023   Outpatient Medications Prior to Visit  Medication Sig Dispense Refill   aspirin  EC 81 MG tablet Take 1 tablet (81 mg total) by mouth daily. Swallow whole.     clotrimazole -betamethasone  (LOTRISONE ) cream APPLY 1 APPLICATION 2 (TWO) TIMES DAILY TOPICALLY. 135 g 1   dapagliflozin  propanediol (FARXIGA ) 10 MG TABS tablet Take 1 tablet (10 mg total) by mouth daily. 90 tablet 1   esomeprazole  (NEXIUM ) 40 MG capsule TAKE 1 CAPSULE DAILY 90 capsule 1   fenofibrate  160 MG tablet TAKE 1 TABLET DAILY 90 tablet 1   gentamicin (GARAMYCIN) 0.3 % ophthalmic solution Place 2 drops into the right eye every 4 (four) hours.     glimepiride  (AMARYL ) 1 MG tablet TAKE 1/2 TABLET DAILY WITH BREAKFAST 45 tablet 3   glucose blood (ONE TOUCH ULTRA TEST) test strip 1 each by Other route daily. E11.9 100 each 0   isosorbide  mononitrate (IMDUR ) 30 MG 24 hr tablet Take 1 tablet (30 mg total) by mouth daily. 30 tablet 3   lisinopril  (ZESTRIL ) 20 MG tablet TAKE 1/2 TABLET DAILY 45 tablet 1   metFORMIN  (GLUCOPHAGE -XR) 500 MG 24 hr tablet Take 1 tablet (500 mg total) by mouth  2 (two) times daily with a meal. 180 tablet 1   ONETOUCH DELICA LANCETS MISC 1 each by Does not apply route See admin instructions. Check blood sugar once daily     rosuvastatin  (CRESTOR ) 20 MG tablet TAKE 1 TABLET DAILY 90 tablet 1   Semaglutide , 2 MG/DOSE, (OZEMPIC , 2 MG/DOSE,) 8 MG/3ML SOPN INJECT 2MG  SUBCUTANEOUSLY  ONCE WEEKLY (EVERY 7 DAYS) AS DIRECTED 6 mL 1   tamsulosin  (FLOMAX ) 0.4 MG CAPS capsule Take 1 capsule (0.4 mg total) by mouth daily. 90 capsule 1   No facility-administered medications prior to visit.     Review  of Systems: as mentioned in HPI  Constitutional:   No  weight loss, night sweats,  Fevers, chills, fatigue, or  lassitude.  HEENT:   No headaches,  Difficulty swallowing,  Tooth/dental problems, or  Sore throat,                No sneezing, itching, ear ache, nasal congestion, post nasal drip,   CV:  No chest pain,  Orthopnea, PND, swelling in lower extremities, anasarca, dizziness, palpitations, syncope.   GI  No heartburn, indigestion, abdominal pain, nausea, vomiting, diarrhea, change in bowel habits, loss of appetite, bloody stools.   Resp: No shortness of breath with exertion or at rest.  No excess mucus, no productive cough,  No non-productive cough,  No coughing up of blood.  No change in color of mucus.  No wheezing.  No chest wall deformity  Skin: no rash or lesions.  GU: no dysuria, change in color of urine, no urgency or frequency.  No flank pain, no hematuria   MS:  No joint pain or swelling.  No decreased range of motion.  No back pain.    Physical Exam  BP 122/74   Pulse 72   Ht 5' 7 (1.702 m)   Wt 214 lb (97.1 kg)   SpO2 96%   BMI 33.52 kg/m   GEN: A/Ox3; pleasant , NAD, well nourished    HEENT:  [place]/AT,  EACs-clear, TMs-wnl, NOSE-clear, THROAT-clear, no lesions, no postnasal drip or exudate noted. Mallampati 4  NECK:  Supple w/ fair ROM; no JVD; normal carotid impulses w/o bruits; no thyromegaly or nodules palpated; no lymphadenopathy.    RESP  Clear  P & A; w/o, wheezes/ rales/ or rhonchi. no accessory muscle use, no dullness to percussion  CARD:  RRR, no m/r/g, no peripheral edema, pulses intact, no cyanosis or clubbing.  GI:   Soft & nt; nml bowel sounds; no organomegaly or masses detected.   Musco: Warm bil, no deformities or joint swelling noted.   Neuro: alert, no focal deficits noted.    Skin: Warm, no lesions or rashes    Lab Results:  CBC    Component Value Date/Time   WBC 8.0 03/26/2024 1229   RBC 5.20 03/26/2024 1229   HGB 15.1  03/26/2024 1229   HCT 43.5 03/26/2024 1229   PLT 227 03/26/2024 1229   MCV 83.7 03/26/2024 1229   MCH 29.0 03/26/2024 1229   MCHC 34.7 03/26/2024 1229   RDW 12.1 03/26/2024 1229   LYMPHSABS 1.5 09/11/2023 0906   MONOABS 0.5 09/11/2023 0906   EOSABS 0.3 09/11/2023 0906   BASOSABS 0.1 09/11/2023 0906    BMET    Component Value Date/Time   NA 130 (L) 03/26/2024 1229   K 4.7 03/26/2024 1229   CL 93 (L) 03/26/2024 1229   CO2 25 03/26/2024 1229   GLUCOSE 262 (H) 03/26/2024 1229  BUN 25 (H) 03/26/2024 1229   CREATININE 1.26 (H) 03/26/2024 1229   CREATININE 1.17 05/06/2014 0735   CALCIUM  9.8 03/26/2024 1229   GFRNONAA >60 03/26/2024 1229   GFRAA 103 01/05/2007 0000    BNP No results found for: BNP  ProBNP No results found for: PROBNP  Imaging: CT Cervical Spine Wo Contrast Result Date: 03/26/2024 CLINICAL DATA:  Cervical radiculopathy, no red flags Left arm numbness. EXAM: CT CERVICAL SPINE WITHOUT CONTRAST TECHNIQUE: Multidetector CT imaging of the cervical spine was performed without intravenous contrast. Multiplanar CT image reconstructions were also generated. RADIATION DOSE REDUCTION: This exam was performed according to the departmental dose-optimization program which includes automated exposure control, adjustment of the mA and/or kV according to patient size and/or use of iterative reconstruction technique. COMPARISON:  None Available. FINDINGS: Alignment: Normal. Skull base and vertebrae: No acute fracture. No primary bone lesion or focal pathologic process. Soft tissues and spinal canal: No prevertebral fluid or swelling. No visible canal hematoma. Disc levels: Mild disc space narrowing with right paracentral disc protrusion at C5-C6. Narrowing of the right neural foramina but no spinal canal stenosis. Disc space narrowing and spurring with broad-based disc bulge at C6-C7. No high-grade canal stenosis. Upper chest: No acute findings. Other: Carotid calcifications.  IMPRESSION: 1. No acute findings. 2. Degenerative disc disease at C5-C6 and C6-C7. Right paracentral disc protrusion at C5-C6 with narrowing of the right neural foramina. Consider cervical spine MRI for further assessment as clinically indicated. Electronically Signed   By: Andrea Gasman M.D.   On: 03/26/2024 14:02   CT Head Wo Contrast Result Date: 03/26/2024 CLINICAL DATA:  Neuro deficit, acute, stroke suspected Left arm tingling EXAM: CT HEAD WITHOUT CONTRAST TECHNIQUE: Contiguous axial images were obtained from the base of the skull through the vertex without intravenous contrast. RADIATION DOSE REDUCTION: This exam was performed according to the departmental dose-optimization program which includes automated exposure control, adjustment of the mA and/or kV according to patient size and/or use of iterative reconstruction technique. COMPARISON:  None Available. FINDINGS: Brain: No intracranial hemorrhage, mass effect, or midline shift. No hydrocephalus. The basilar cisterns are patent. No evidence of territorial infarct or acute ischemia. No extra-axial or intracranial fluid collection. Vascular: Atherosclerosis of skullbase vasculature without hyperdense vessel or abnormal calcification. Skull: No fracture or focal lesion. Sinuses/Orbits: Diffuse paranasal sinus mucosal thickening. No sinus fluid levels. No mastoid effusion. Other: None. IMPRESSION: 1. No acute intracranial abnormality. 2. Diffuse paranasal sinus mucosal thickening. Electronically Signed   By: Andrea Gasman M.D.   On: 03/26/2024 13:40    Administration History     None           No data to display          No results found for: NITRICOXIDE   Assessment & Plan:   Assessment & Plan OBSTRUCTIVE SLEEP APNEA -  Doing well on current AutoPAP with reduction in AHI from 88.7/hr on NPSG to 8.9/hr -  Continue current settings Asthma, unspecified asthma severity, unspecified whether complicated, unspecified whether  persistent -  Recommend PFTs to reevaluate; patient declines presently Tobacco use -  Remains abstinent from smoking   Return in about 6 months (around 10/04/2024) for compliance download.  Candis Dandy, PA-C 04/05/2024

## 2024-04-05 NOTE — Assessment & Plan Note (Signed)
-    Doing well on current AutoPAP with reduction in AHI from 88.7/hr on NPSG to 8.9/hr -  Continue current settings

## 2024-04-05 NOTE — Patient Instructions (Signed)
 Continue CPAP usage nightly.  Follow up in 6 months for compliance download review.  Return to clinic sooner if new or worsening symptoms.

## 2024-04-11 ENCOUNTER — Other Ambulatory Visit: Payer: Self-pay | Admitting: Family Medicine

## 2024-04-11 DIAGNOSIS — E1169 Type 2 diabetes mellitus with other specified complication: Secondary | ICD-10-CM

## 2024-04-12 ENCOUNTER — Ambulatory Visit (HOSPITAL_BASED_OUTPATIENT_CLINIC_OR_DEPARTMENT_OTHER)

## 2024-04-14 ENCOUNTER — Other Ambulatory Visit: Payer: Self-pay | Admitting: Internal Medicine

## 2024-04-14 DIAGNOSIS — E1165 Type 2 diabetes mellitus with hyperglycemia: Secondary | ICD-10-CM

## 2024-04-14 DIAGNOSIS — E1169 Type 2 diabetes mellitus with other specified complication: Secondary | ICD-10-CM

## 2024-04-14 NOTE — Telephone Encounter (Signed)
 Return in about 3 months (around 08/24/2023).

## 2024-04-27 ENCOUNTER — Telehealth: Payer: Self-pay

## 2024-04-27 MED ORDER — LISINOPRIL 20 MG PO TABS: 10.0000 mg | ORAL_TABLET | Freq: Every day | ORAL | 1 refills | Status: AC

## 2024-04-27 NOTE — Telephone Encounter (Signed)
 Requesting: Lisinopril  20mg  Last Visit: 03/22/2024 Next Visit: 06/21/2024 Last Refill: 07/09/23 Dr. Comer Greet   Please Advise

## 2024-05-07 NOTE — Progress Notes (Signed)
 Zachary Clay.                                          MRN: [id number]   05/07/2024   The [facility] Quality Team Specialist reviewed this patient medical record for the purposes of chart review for care gap closure. The following were reviewed: chart review for care gap closure-glycemic status assessment and kidney health evaluation for diabetes:eGFR  and uACR.    [facility] Quality Team

## 2024-05-26 DIAGNOSIS — G4733 Obstructive sleep apnea (adult) (pediatric): Secondary | ICD-10-CM | POA: Diagnosis not present

## 2024-05-28 DIAGNOSIS — G4733 Obstructive sleep apnea (adult) (pediatric): Secondary | ICD-10-CM | POA: Diagnosis not present

## 2024-06-21 ENCOUNTER — Ambulatory Visit (INDEPENDENT_AMBULATORY_CARE_PROVIDER_SITE_OTHER): Admitting: Nurse Practitioner

## 2024-06-21 ENCOUNTER — Encounter: Payer: Self-pay | Admitting: Nurse Practitioner

## 2024-06-21 ENCOUNTER — Ambulatory Visit: Payer: Self-pay | Admitting: Nurse Practitioner

## 2024-06-21 VITALS — BP 124/68 | HR 72 | Temp 97.1°F | Ht 67.0 in | Wt 226.4 lb

## 2024-06-21 DIAGNOSIS — E785 Hyperlipidemia, unspecified: Secondary | ICD-10-CM | POA: Diagnosis not present

## 2024-06-21 DIAGNOSIS — M5030 Other cervical disc degeneration, unspecified cervical region: Secondary | ICD-10-CM | POA: Insufficient documentation

## 2024-06-21 DIAGNOSIS — Z7984 Long term (current) use of oral hypoglycemic drugs: Secondary | ICD-10-CM | POA: Diagnosis not present

## 2024-06-21 DIAGNOSIS — E1169 Type 2 diabetes mellitus with other specified complication: Secondary | ICD-10-CM | POA: Diagnosis not present

## 2024-06-21 DIAGNOSIS — Z7985 Long-term (current) use of injectable non-insulin antidiabetic drugs: Secondary | ICD-10-CM | POA: Diagnosis not present

## 2024-06-21 DIAGNOSIS — R079 Chest pain, unspecified: Secondary | ICD-10-CM | POA: Insufficient documentation

## 2024-06-21 DIAGNOSIS — E1165 Type 2 diabetes mellitus with hyperglycemia: Secondary | ICD-10-CM

## 2024-06-21 DIAGNOSIS — L853 Xerosis cutis: Secondary | ICD-10-CM | POA: Insufficient documentation

## 2024-06-21 DIAGNOSIS — B351 Tinea unguium: Secondary | ICD-10-CM | POA: Insufficient documentation

## 2024-06-21 LAB — CBC WITH DIFFERENTIAL/PLATELET
Basophils Absolute: 0.1 K/uL (ref 0.0–0.1)
Basophils Relative: 0.9 % (ref 0.0–3.0)
Eosinophils Absolute: 0.2 K/uL (ref 0.0–0.7)
Eosinophils Relative: 2.7 % (ref 0.0–5.0)
HCT: 45.5 % (ref 39.0–52.0)
Hemoglobin: 15.1 g/dL (ref 13.0–17.0)
Lymphocytes Relative: 25.8 % (ref 12.0–46.0)
Lymphs Abs: 1.9 K/uL (ref 0.7–4.0)
MCHC: 33.2 g/dL (ref 30.0–36.0)
MCV: 85.1 fl (ref 78.0–100.0)
Monocytes Absolute: 0.6 K/uL (ref 0.1–1.0)
Monocytes Relative: 8.2 % (ref 3.0–12.0)
Neutro Abs: 4.5 K/uL (ref 1.4–7.7)
Neutrophils Relative %: 62.4 % (ref 43.0–77.0)
Platelets: 223 K/uL (ref 150.0–400.0)
RBC: 5.35 Mil/uL (ref 4.22–5.81)
RDW: 13 % (ref 11.5–15.5)
WBC: 7.2 K/uL (ref 4.0–10.5)

## 2024-06-21 LAB — COMPREHENSIVE METABOLIC PANEL WITH GFR
ALT: 21 U/L (ref 3–53)
AST: 15 U/L (ref 5–37)
Albumin: 4.5 g/dL (ref 3.5–5.2)
Alkaline Phosphatase: 48 U/L (ref 39–117)
BUN: 21 mg/dL (ref 6–23)
CO2: 31 meq/L (ref 19–32)
Calcium: 10 mg/dL (ref 8.4–10.5)
Chloride: 92 meq/L — ABNORMAL LOW (ref 96–112)
Creatinine, Ser: 1.24 mg/dL (ref 0.40–1.50)
GFR: 61.49 mL/min
Glucose, Bld: 335 mg/dL — ABNORMAL HIGH (ref 70–99)
Potassium: 4.7 meq/L (ref 3.5–5.1)
Sodium: 132 meq/L — ABNORMAL LOW (ref 135–145)
Total Bilirubin: 0.6 mg/dL (ref 0.2–1.2)
Total Protein: 7.1 g/dL (ref 6.0–8.3)

## 2024-06-21 LAB — LIPID PANEL
Cholesterol: 192 mg/dL (ref 28–200)
HDL: 32.2 mg/dL — ABNORMAL LOW
NonHDL: 159.42
Total CHOL/HDL Ratio: 6
Triglycerides: 475 mg/dL — ABNORMAL HIGH (ref 10.0–149.0)
VLDL: 95 mg/dL — ABNORMAL HIGH (ref 0.0–40.0)

## 2024-06-21 LAB — HEMOGLOBIN A1C: Hgb A1c MFr Bld: 13.1 % — ABNORMAL HIGH (ref 4.6–6.5)

## 2024-06-21 LAB — MICROALBUMIN / CREATININE URINE RATIO
Creatinine,U: 23.8 mg/dL
Microalb Creat Ratio: 1023.6 mg/g — ABNORMAL HIGH (ref 0.0–30.0)
Microalb, Ur: 24.4 mg/dL — ABNORMAL HIGH (ref 0.7–1.9)

## 2024-06-21 LAB — LDL CHOLESTEROL, DIRECT: Direct LDL: 85 mg/dL

## 2024-06-21 MED ORDER — ESOMEPRAZOLE MAGNESIUM 40 MG PO CPDR: 40.0000 mg | DELAYED_RELEASE_CAPSULE | Freq: Every day | ORAL | 1 refills | Status: AC

## 2024-06-21 MED ORDER — TIRZEPATIDE 2.5 MG/0.5ML ~~LOC~~ SOAJ: 2.5000 mg | SUBCUTANEOUS | 1 refills | Status: DC

## 2024-06-21 MED ORDER — ROSUVASTATIN CALCIUM 20 MG PO TABS: 20.0000 mg | ORAL_TABLET | Freq: Every day | ORAL | 1 refills | Status: AC

## 2024-06-21 MED ORDER — ISOSORBIDE MONONITRATE ER 30 MG PO TB24: 30.0000 mg | ORAL_TABLET | Freq: Every day | ORAL | 0 refills | Status: AC

## 2024-06-21 MED ORDER — CLOTRIMAZOLE-BETAMETHASONE 1-0.05 % EX CREA: TOPICAL_CREAM | Freq: Two times a day (BID) | CUTANEOUS | 1 refills | Status: AC

## 2024-06-21 NOTE — Assessment & Plan Note (Signed)
 Chronic onychomycosis for 30 years, prefers topical treatment due to potential liver side effects of oral antifungals. Recommended starting a skin, hair, and nails vitamin once daily.

## 2024-06-21 NOTE — Assessment & Plan Note (Signed)
 Intermittent chest pain relieved by taking deep breaths. Recent troponin and EKG at ER negative. He has been out of isosorbide  for several weeks, will refill this and encourage him to follow-up with cardiology.

## 2024-06-21 NOTE — Patient Instructions (Addendum)
 It was great to see you!  Stop ozempic  and start mounjaro injection once a week  Keep drinking plenty of fluids   You can start a skin, hair and nails vitamin once a day to help with your toenails   I would recommend either eucerin or cerave cream multiple times a day for your hands, then at night, put on vaseline to cracked areas   Let's follow-up in 4 weeks, sooner if you have concerns.  If a referral was placed today, you will be contacted for an appointment. Please note that routine referrals can sometimes take up to 3-4 weeks to process. Please call our office if you haven't heard anything after this time frame.  Take care,  Tinnie Harada, NP

## 2024-06-21 NOTE — Assessment & Plan Note (Signed)
 Farxiga  10mg  daily, Amaryl  1/2mg  daily, Metformin  XR 500mg  BID

## 2024-06-21 NOTE — Assessment & Plan Note (Signed)
 Chronic, not controlled. A1c at 12.3% indicates poor glycemic control. Discontinued Ozempic  and initiated Mounjaro at the starting dose 2.5mg  once weekly due to gastrointestinal side effects. Provided a coupon card for the first month of Mounjaro. Advised to drink plenty of water and eat only if hungry, consuming a small meal with medication if it's mealtime. Check CMP, CBC, A1c, urine microalbumin today. Foot exam normal. Follow-up in 4 weeks.

## 2024-06-21 NOTE — Assessment & Plan Note (Signed)
 A protruding disc is causing mild narrowing on the right side, with numbness likely due to sleeping position. Continue to monitor symptoms and follow up with any concerns.

## 2024-06-21 NOTE — Assessment & Plan Note (Signed)
 Start cerave or eucerin cream to hands bilaterally multiple times a day following with vaseline to cracked areas at bedtime.

## 2024-06-21 NOTE — Progress Notes (Signed)
 "  Established Patient Office Visit  Subjective   Patient ID: Zachary Patchin., male    DOB: 15-Dec-1960  Age: 64 y.o. MRN: [id number]  Chief Complaint  Patient presents with   Diabetes    Follow up, wants to discuss switching Ozempic , patient is fasting    HPI  Discussed the use of AI scribe software for clinical note transcription with the patient, who gave verbal consent to proceed.  History of Present Illness   Zachary Hibberd. Zachary Clay is a 63 year old male with diabetes who presents for medication management and follow-up.  He has significant gastrointestinal side effects with Ozempic  that prevent dose increases and lead to irregular use. Home glucose readings have been 200-325 mg/dL. His last A1c was 12.3%. He has not checked blood sugars regularly over the past month.  He went to the ER in October for arm numbness and was told he might have a slipped disc. He denies neck pain but has shoulder pain. The numbness has resolved.   He has intermittent chest pain lasting 10-15 minutes that resolves with deep breathing and is unsure if it is cardiac or respiratory. He last saw a cardiologist in July and plans follow-up.  He recently quit smoking and has noticed a marked increase in appetite.  He has cracks in his hands in winter and long-standing toenail fungus treated with topical cream. He is reluctant to try oral antifungals because of concern about liver effects.        ROS See pertinent positives and negatives per HPI.    Objective:     BP 124/68 (BP Location: Left Arm, Patient Position: Sitting, Cuff Size: Large)   Pulse 72   Temp (!) 97.1 F (36.2 C)   Ht 5' 7 (1.702 m)   Wt 226 lb 6.4 oz (102.7 kg)   SpO2 95%   BMI 35.46 kg/m  BP Readings from Last 3 Encounters:  06/21/24 124/68  04/05/24 122/74  03/26/24 133/83   Wt Readings from Last 3 Encounters:  06/21/24 226 lb 6.4 oz (102.7 kg)  04/05/24 214 lb (97.1 kg)  03/26/24 218 lb 12.9 oz (99.2 kg)      Physical Exam Vitals and nursing note reviewed.  Constitutional:      Appearance: Normal appearance.  HENT:     Head: Normocephalic.  Eyes:     Conjunctiva/sclera: Conjunctivae normal.  Cardiovascular:     Rate and Rhythm: Normal rate and regular rhythm.     Pulses: Normal pulses.     Heart sounds: Normal heart sounds.  Pulmonary:     Effort: Pulmonary effort is normal.     Breath sounds: Normal breath sounds.  Musculoskeletal:     Cervical back: Normal range of motion.  Skin:    General: Skin is warm.  Neurological:     General: No focal deficit present.     Mental Status: He is alert and oriented to person, place, and time.  Psychiatric:        Mood and Affect: Mood normal.        Behavior: Behavior normal.        Thought Content: Thought content normal.        Judgment: Judgment normal.    Diabetic Foot Exam - Simple   Simple Foot Form Visual Inspection No deformities, no ulcerations, no other skin breakdown bilaterally: Yes Sensation Testing Intact to touch and monofilament testing bilaterally: Yes Pulse Check Posterior Tibialis and Dorsalis pulse intact bilaterally: Yes Comments  Great toenails thickened and yellow bilaterally       Assessment & Plan:   Problem List Items Addressed This Visit       Endocrine   Uncontrolled type 2 diabetes mellitus with hyperglycemia (HCC) - Primary   Chronic, not controlled. A1c at 12.3% indicates poor glycemic control. Discontinued Ozempic  and initiated Mounjaro at the starting dose 2.5mg  once weekly due to gastrointestinal side effects. Provided a coupon card for the first month of Mounjaro. Advised to drink plenty of water and eat only if hungry, consuming a small meal with medication if it's mealtime. Check CMP, CBC, A1c, urine microalbumin today. Foot exam normal. Follow-up in 4 weeks.       Relevant Medications   tirzepatide (MOUNJARO) 2.5 MG/0.5ML Pen   rosuvastatin  (CRESTOR ) 20 MG tablet   Other Relevant Orders    CBC with Differential/Platelet   Comprehensive metabolic panel with GFR   Hemoglobin A1c   Microalbumin / creatinine urine ratio   Hyperlipidemia associated with type 2 diabetes mellitus (HCC)   Chronic, stable. Continue rosuvastatin  20mg  daily and fenofibrate  160mg  daily. Check fasting CMP, CBC, lipid panel today      Relevant Medications   tirzepatide (MOUNJARO) 2.5 MG/0.5ML Pen   isosorbide  mononitrate (IMDUR ) 30 MG 24 hr tablet   rosuvastatin  (CRESTOR ) 20 MG tablet   Other Relevant Orders   CBC with Differential/Platelet   Comprehensive metabolic panel with GFR   Lipid panel     Musculoskeletal and Integument   Onychomycosis   Chronic onychomycosis for 30 years, prefers topical treatment due to potential liver side effects of oral antifungals. Recommended starting a skin, hair, and nails vitamin once daily.       Relevant Medications   clotrimazole -betamethasone  (LOTRISONE ) cream   Dry skin   Start cerave or eucerin cream to hands bilaterally multiple times a day following with vaseline to cracked areas at bedtime.       Degenerative cervical disc   A protruding disc is causing mild narrowing on the right side, with numbness likely due to sleeping position. Continue to monitor symptoms and follow up with any concerns.        Other   Long-term current use of injectable noninsulin antidiabetic medication   Switch from ozmepic to mounjaro 2.5mg  weekly.       Long term current use of oral hypoglycemic drug    Farxiga  10mg  daily, Amaryl  1/2mg  daily, Metformin  XR 500mg  BID      Chest pain   Intermittent chest pain relieved by taking deep breaths. Recent troponin and EKG at ER negative. He has been out of isosorbide  for several weeks, will refill this and encourage him to follow-up with cardiology.        Return in about 4 weeks (around 07/19/2024) for Diabetes.    Zachary DELENA Harada, NP  "

## 2024-06-21 NOTE — Assessment & Plan Note (Signed)
 Switch from ozmepic to mounjaro 2.5mg  weekly.

## 2024-06-21 NOTE — Assessment & Plan Note (Signed)
 Chronic, stable. Continue rosuvastatin  20mg  daily and fenofibrate  160mg  daily. Check fasting CMP, CBC, lipid panel today

## 2024-06-23 ENCOUNTER — Telehealth: Payer: Self-pay | Admitting: Nurse Practitioner

## 2024-06-23 NOTE — Telephone Encounter (Signed)
 Called pt regarding medication being sent to pharmacy 12/29. He stated he went to the pharmacy and they did not have it. I called [facility] in [place]. They stated they were out of stock and they should receive medication this Friday to fill. Pt has no copay.   Pt also asked about isosorbide  mononitrate sent to [facility]. I called and spoke to agent who stated 2 RXs were received. One on 12/26 by Dr. Krasowski and one on 12/29 by Tinnie Harada, NP. They filled the RX sent by Dr. Bernie on 12/30 and it is processing. Notified pt. He confirmed receiving a text but it did not state the name of the medication. I advised him I was informed this medication is not available for auto refill. He can contact [facility] 14 days ahead of time to request refills in the future. Pt understood and no further concerns.

## 2024-06-23 NOTE — Telephone Encounter (Signed)
 Copied from CRM #[id number]. Topic: Clinical - Prescription Issue >> Jun 22, 2024  1:52 PM Pinkey ORN wrote: Reason for CRM: tirzepatide (MOUNJARO) 2.5 MG/0.5ML Pen >> Jun 22, 2024  1:54 PM Pinkey ORN wrote: Patient states that the tirzepatide (MOUNJARO) 2.5 MG/0.5ML Pen never made it to the requested pharmacy, [facility] [place] [place]. Patient is requesting that it is sent again. Please follow up with patient once completed.

## 2024-07-19 ENCOUNTER — Ambulatory Visit: Admitting: Nurse Practitioner

## 2024-07-26 NOTE — Assessment & Plan Note (Signed)
 Chronic, not controlled. Increase Mounjaro  5mg  once weekly. Continue glimepiride  0.5mg  daily, metformin  XR 500mg  BID, and Farxiga  10mg  daily. Monitor blood glucose at home. Follow-up appointment for blood work is scheduled for the end of March. Continue to focus on limiting sugars, carbs in diet along with regular exercise. Consider insulin  if there is no improvement with the Mounjaro  increase. Foot exam UTD (normal).

## 2024-07-27 ENCOUNTER — Encounter: Payer: Self-pay | Admitting: Nurse Practitioner

## 2024-07-27 ENCOUNTER — Telehealth: Admitting: Nurse Practitioner

## 2024-07-27 DIAGNOSIS — Z7985 Long-term (current) use of injectable non-insulin antidiabetic drugs: Secondary | ICD-10-CM

## 2024-07-27 DIAGNOSIS — E1165 Type 2 diabetes mellitus with hyperglycemia: Secondary | ICD-10-CM

## 2024-07-27 DIAGNOSIS — Z7984 Long term (current) use of oral hypoglycemic drugs: Secondary | ICD-10-CM

## 2024-07-27 MED ORDER — TIRZEPATIDE 5 MG/0.5ML ~~LOC~~ SOAJ: 5.0000 mg | SUBCUTANEOUS | 0 refills | Status: AC

## 2024-07-27 NOTE — Assessment & Plan Note (Signed)
Increase mounjaro to 5mg  weekly

## 2024-07-27 NOTE — Assessment & Plan Note (Signed)
 Farxiga  10mg  daily, Amaryl  1/2mg  daily, Metformin  XR 500mg  BID

## 2024-09-21 ENCOUNTER — Ambulatory Visit: Admitting: Nurse Practitioner

## 2024-10-04 ENCOUNTER — Ambulatory Visit (HOSPITAL_BASED_OUTPATIENT_CLINIC_OR_DEPARTMENT_OTHER)
